# Patient Record
Sex: Female | Born: 1985 | Race: White | Hispanic: No | Marital: Married | Smoking: Never smoker
Health system: Southern US, Community
[De-identification: ages and names within clinical notes are randomized; demographics above are authoritative.]

## PROBLEM LIST (undated history)

## (undated) DIAGNOSIS — D649 Anemia, unspecified: Secondary | ICD-10-CM

## (undated) DIAGNOSIS — O039 Complete or unspecified spontaneous abortion without complication: Secondary | ICD-10-CM

## (undated) DIAGNOSIS — L709 Acne, unspecified: Secondary | ICD-10-CM

## (undated) DIAGNOSIS — F32A Depression, unspecified: Secondary | ICD-10-CM

## (undated) HISTORY — DX: Acne, unspecified: L70.9

## (undated) HISTORY — PX: TONSILLECTOMY: SUR1361

## (undated) HISTORY — DX: Anemia, unspecified: D64.9

## (undated) HISTORY — DX: Complete or unspecified spontaneous abortion without complication: O03.9

## (undated) HISTORY — DX: Depression, unspecified: F32.A

## (undated) HISTORY — PX: TONSILLECTOMY AND ADENOIDECTOMY: SUR1326

## (undated) HISTORY — PX: DILATION AND CURETTAGE OF UTERUS: SHX78

## (undated) HISTORY — PX: LASIK: SHX215

## (undated) HISTORY — PX: BREAST ENHANCEMENT SURGERY: SHX7

## (undated) HISTORY — PX: EYE SURGERY: SHX253

## (undated) HISTORY — PX: COSMETIC SURGERY: SHX468

---

## 2015-12-16 DIAGNOSIS — H1789 Other corneal scars and opacities: Secondary | ICD-10-CM | POA: Diagnosis not present

## 2015-12-16 DIAGNOSIS — H52223 Regular astigmatism, bilateral: Secondary | ICD-10-CM | POA: Diagnosis not present

## 2016-01-12 DIAGNOSIS — L700 Acne vulgaris: Secondary | ICD-10-CM | POA: Diagnosis not present

## 2016-01-28 DIAGNOSIS — Z01419 Encounter for gynecological examination (general) (routine) without abnormal findings: Secondary | ICD-10-CM | POA: Diagnosis not present

## 2016-01-28 DIAGNOSIS — Z124 Encounter for screening for malignant neoplasm of cervix: Secondary | ICD-10-CM | POA: Diagnosis not present

## 2016-01-28 DIAGNOSIS — Z30432 Encounter for removal of intrauterine contraceptive device: Secondary | ICD-10-CM | POA: Diagnosis not present

## 2016-01-29 DIAGNOSIS — Z01419 Encounter for gynecological examination (general) (routine) without abnormal findings: Secondary | ICD-10-CM | POA: Diagnosis not present

## 2016-01-29 DIAGNOSIS — Z124 Encounter for screening for malignant neoplasm of cervix: Secondary | ICD-10-CM | POA: Diagnosis not present

## 2016-01-29 LAB — HM PAP SMEAR: HM Pap smear: NEGATIVE

## 2016-01-30 LAB — HM PAP SMEAR: HM Pap smear: NEGATIVE

## 2016-08-25 DIAGNOSIS — S93601A Unspecified sprain of right foot, initial encounter: Secondary | ICD-10-CM | POA: Diagnosis not present

## 2016-08-25 DIAGNOSIS — S99921A Unspecified injury of right foot, initial encounter: Secondary | ICD-10-CM | POA: Diagnosis not present

## 2016-09-03 ENCOUNTER — Encounter: Payer: Self-pay | Admitting: Advanced Practice Midwife

## 2016-09-03 ENCOUNTER — Ambulatory Visit (INDEPENDENT_AMBULATORY_CARE_PROVIDER_SITE_OTHER): Payer: 59 | Admitting: Advanced Practice Midwife

## 2016-09-03 VITALS — BP 110/50 | HR 84 | Wt 141.0 lb

## 2016-09-03 DIAGNOSIS — Z3480 Encounter for supervision of other normal pregnancy, unspecified trimester: Secondary | ICD-10-CM | POA: Diagnosis not present

## 2016-09-03 DIAGNOSIS — Z113 Encounter for screening for infections with a predominantly sexual mode of transmission: Secondary | ICD-10-CM

## 2016-09-03 DIAGNOSIS — Z3491 Encounter for supervision of normal pregnancy, unspecified, first trimester: Secondary | ICD-10-CM

## 2016-09-03 NOTE — Patient Instructions (Signed)

## 2016-09-03 NOTE — Assessment & Plan Note (Deleted)
[facility] Prenatal Labs  Dating  Blood type:     Genetic Screen 1 Screen:    AFP:     Quad:     NIPS: Antibody:   Anatomic US  Rubella:   Varicella: I  GTT Early:               Third trimester:  RPR:     Rhogam  HBsAg:     TDaP vaccine                       Flu Shot: HIV:     Baby Food                                GBS:   Contraception  Pap:  CBB     CS/VBAC    Support Person

## 2016-09-03 NOTE — Progress Notes (Signed)
New Obstetric Patient H&P    Chief Complaint: "Desires prenatal care"   History of Present Illness: Patient is a 31 y.o. [id number] Not Hispanic or Latino female, LMP 07/21/2016 presents with amenorrhea and positive home pregnancy test. Based on her LMP, her EDD is Estimated Date of Delivery: 04/27/17 and her EGA is [redacted]w[redacted]d. Cycles are 4. days, regular, and occur approximately every : 28 days. Her last pap smear was 7 months ago and was no abnormalities.    She had a urine pregnancy test which was positive 2 week(s)  ago. Her last menstrual period was normal and lasted for  4 day(s). Since her LMP she claims she has experienced breast tenderness and fatigue. She denies vaginal bleeding. Her past medical history is notable for breast augmentation. Her prior pregnancies are notable for none  Since her LMP, she admits to the use of tobacco products  no She claims she has gained   no pounds since the start of her pregnancy.  There are cats in the home in the home  yes If yes Indoor husband does litter box She admits close contact with children on a regular basis  yes  She has had chicken pox in the past yes She has had Tuberculosis exposures, symptoms, or previously tested positive for TB   no Current or past history of domestic violence. no  Genetic Screening/Teratology Counseling: (Includes patient, baby's father, or anyone in either family with:)   1. Patient's age >/= 67 at [facility]  no 2. Thalassemia ([place], [place], Mediterranean, or Asian background): MCV<80  no 3. Neural tube defect (meningomyelocele, spina bifida, anencephaly)  no 4. Congenital heart defect  no  5. Down syndrome  no 6. Tay-Sachs (Jewish, [place]  no 7. Canavan's Disease  no 8. Sickle cell disease or trait (African)  no  9. Hemophilia or other blood disorders  no  10. Muscular dystrophy  no  11. Cystic fibrosis  no  12. Huntington's Chorea  no  13. Mental retardation/autism  no 14. Other inherited genetic  or chromosomal disorder  no 15. Maternal metabolic disorder (DM, PKU, etc)  no 16. Patient or FOB with a child with a birth defect not listed above no  16a. Patient or FOB with a birth defect themselves no 17. Recurrent pregnancy loss, or stillbirth  no  18. Any medications since LMP other than prenatal vitamins (include vitamins, supplements, OTC meds, drugs, alcohol)  no 19. Any other genetic/environmental exposure to discuss  no  Infection History:   1. Lives with someone with TB or TB exposed  no  2. Patient or partner has history of genital herpes  no 3. Rash or viral illness since LMP  no 4. History of STI (GC, CT, HPV, syphilis, HIV)  no 5. History of recent travel :  no  Other pertinent information:  no     Review of Systems:10 point review of systems negative unless otherwise noted in HPI  Past Medical History:  Past Medical History:  Diagnosis Date  . Acne     Past Surgical History:  Past Surgical History:  Procedure Laterality Date  . BREAST ENHANCEMENT SURGERY    . LASIK    . TONSILLECTOMY AND ADENOIDECTOMY      Gynecologic History: Patient's last menstrual period was 07/21/2016 (exact date).  Obstetric History: [id number]  Family History:  Family History  Problem Relation Age of Onset  . Diabetes Maternal Grandfather     Social History:  Social  History   Social History  . Marital status: Married    Spouse name: N/A  . Number of children: N/A  . Years of education: N/A   Occupational History  . Not on file.   Social History Main Topics  . Smoking status: Never Smoker  . Smokeless tobacco: Never Used  . Alcohol use Yes     Comment: LIGHT  . Drug use: No  . Sexual activity: Yes    Birth control/ protection: Pill   Other Topics Concern  . Not on file   Social History Narrative  . No narrative on file    Allergies:  No Known Allergies  Medications: Prior to Admission medications   Medication Sig Start Date End Date Taking?  Authorizing Provider  Prenatal Vit-Fe Fumarate-FA (MULTIVITAMIN-PRENATAL) 27-0.8 MG TABS tablet Take 1 tablet by mouth daily at 12 noon.   Yes [provider]  Calcium-Vitamin D-Vitamin K (VIACTIV) 500-500-40 MG-UNT-MCG CHEW Chew 1 tablet by mouth daily.    [provider]    Physical Exam Vitals: Blood pressure (!) 110/50, pulse 84, weight 141 lb (64 kg), last menstrual period 07/21/2016.  General: NAD HEENT: normocephalic, anicteric Thyroid: no enlargement, no palpable nodules Pulmonary: No increased work of breathing, CTAB Cardiovascular: RRR, distal pulses 2+ Abdomen: NABS, soft, non-tender, non-distended.  Umbilicus without lesions.  No hepatomegaly, splenomegaly or masses palpable. No evidence of hernia  Genitourinary:  External: Normal external female genitalia.  Normal urethral meatus, normal  Bartholin's and Skene's glands.    Vagina: Normal vaginal mucosa, no evidence of prolapse.    Cervix: Grossly normal in appearance, no bleeding, no CMT  Uterus: Enlarged, mobile, normal contour.    Adnexa: ovaries non-enlarged, no adnexal masses  Rectal: deferred Extremities: no edema, erythema, or tenderness Neurologic: Grossly intact Psychiatric: mood appropriate, affect full   Assessment: 31 y.o. G3P2002 at 5328w2d presenting to initiate prenatal care  Plan: 1) Avoid alcoholic beverages. 2) Patient encouraged not to smoke.  3) Discontinue the use of all non-medicinal drugs and chemicals.  4) Take prenatal vitamins daily.  5) Nutrition, food safety (fish, cheese advisories, and high nitrite foods) and exercise discussed. 6) Hospital and practice style discussed with cross coverage system.  7) Genetic Screening, such as with 1st Trimester Screening, cell free fetal DNA, AFP testing, and Ultrasound, as well as with amniocentesis and CVS as appropriate, is discussed with patient. At the conclusion of today's visit patient is undecided regarding genetic testing 8)  Patient is asked about travel to areas at risk for the Zika virus, and counseled to avoid travel and exposure to mosquitoes or sexual partners who may have themselves been exposed to the virus. Testing is discussed, and will be ordered as appropriate.   Tresea MallJane Jenifer Struve, CNM

## 2016-09-04 LAB — RPR+RH+ABO+RUB AB+AB SCR+CB...
Antibody Screen: NEGATIVE
HIV SCREEN 4TH GENERATION: NONREACTIVE
Hematocrit: 36.1 % (ref 34.0–46.6)
Hemoglobin: 12.2 g/dL (ref 11.1–15.9)
Hepatitis B Surface Ag: NEGATIVE
MCH: 31.4 pg (ref 26.6–33.0)
MCHC: 33.8 g/dL (ref 31.5–35.7)
MCV: 93 fL (ref 79–97)
PLATELETS: 271 10*3/uL (ref 150–379)
RBC: 3.89 x10E6/uL (ref 3.77–5.28)
RDW: 13 % (ref 12.3–15.4)
RPR Ser Ql: NONREACTIVE
RUBELLA: 6.67 {index} (ref >0.99)
Rh Factor: POSITIVE
WBC: 7.5 10*3/uL (ref 3.4–10.8)

## 2016-09-07 LAB — URINE CULTURE

## 2016-09-07 LAB — GC/CHLAMYDIA PROBE AMP
Chlamydia trachomatis, NAA: NEGATIVE
NEISSERIA GONORRHOEAE BY PCR: NEGATIVE

## 2016-09-14 ENCOUNTER — Ambulatory Visit (INDEPENDENT_AMBULATORY_CARE_PROVIDER_SITE_OTHER): Payer: 59

## 2016-09-14 ENCOUNTER — Encounter: Payer: 59 | Admitting: Advanced Practice Midwife

## 2016-09-14 DIAGNOSIS — Z3491 Encounter for supervision of normal pregnancy, unspecified, first trimester: Secondary | ICD-10-CM | POA: Diagnosis not present

## 2016-09-14 DIAGNOSIS — Z3687 Encounter for antenatal screening for uncertain dates: Secondary | ICD-10-CM | POA: Diagnosis not present

## 2016-09-23 ENCOUNTER — Telehealth: Payer: Self-pay | Admitting: Obstetrics and Gynecology

## 2016-09-23 ENCOUNTER — Encounter: Payer: 59 | Admitting: Advanced Practice Midwife

## 2016-09-23 ENCOUNTER — Other Ambulatory Visit: Payer: Self-pay | Admitting: Obstetrics and Gynecology

## 2016-09-23 DIAGNOSIS — Z3480 Encounter for supervision of other normal pregnancy, unspecified trimester: Secondary | ICD-10-CM

## 2016-09-23 DIAGNOSIS — Z3A09 9 weeks gestation of pregnancy: Secondary | ICD-10-CM

## 2016-09-23 NOTE — Telephone Encounter (Signed)
Conard NovakJackson, Stephen D, MD  You; Lenoria ChimeHogan, Julie M; Pereira, Shanda BumpsJessica 5 hours ago (4:28 AM)    This patient had an appt on 7/12 with JEG. Please schedule her for a routine prenatal and u/s for NT with associated lab work. Thank you!  (Routing comment)    I called and left voicemail for patient to call back to be schedule

## 2016-09-23 NOTE — Addendum Note (Signed)
Addended by: Thomasene MohairJACKSON, Zahki Hoogendoorn D on: 09/23/2016 04:25 AM   Modules accepted: Orders

## 2016-10-07 ENCOUNTER — Telehealth: Payer: Self-pay | Admitting: Obstetrics and Gynecology

## 2016-10-07 ENCOUNTER — Encounter: Payer: Self-pay | Admitting: Obstetrics and Gynecology

## 2016-10-07 NOTE — Telephone Encounter (Signed)
Patient with two days of spotting, obtained bedside ultrasound on L&D showing IUP, CRL 31100w0d no visible FHT, no flow on color flow.    "Society of Radiologyst in Ultrasound Guidelines for Transvaginal Ultrasonographic Diagnosis of Early Pregnancy Loss" and adopted in ACOG Practice Bulletin Number 150, May 2015 (reaffirmed 2017) "Early Pregnancy Loss"  - CRL of 7mm or greater and absence of fetal heartbeat  - Mean sac diameter of 25mm or greater and no embryo  - Absence of embryo with a discernable heartbeat 2 week after initial ultrasound showing a gestational sac without a yolk sac  - Absence of embryo with a discernable heartbeat 11 days after initial ultrasound showing a gestational sac with  yolk sac present  Condolences offered discussed management option briefly including expectant, medical, and surgical.  Will go home an process discuss with her husband.  Provided with my email.  Discussed follow up clinic ultrasound should she want to take another look for verification.

## 2016-10-08 ENCOUNTER — Other Ambulatory Visit: Payer: Self-pay | Admitting: Obstetrics and Gynecology

## 2016-10-08 MED ORDER — MISOPROSTOL 200 MCG PO TABS: ORAL_TABLET | ORAL | 0 refills | Status: DC

## 2016-10-08 MED ORDER — IBUPROFEN 600 MG PO TABS: 600.0000 mg | ORAL_TABLET | Freq: Four times a day (QID) | ORAL | 3 refills | Status: DC | PRN

## 2016-10-08 MED ORDER — OXYCODONE-ACETAMINOPHEN 5-325 MG PO TABS: 1.0000 | ORAL_TABLET | Freq: Four times a day (QID) | ORAL | 0 refills | Status: DC | PRN

## 2016-10-19 ENCOUNTER — Encounter: Payer: 59 | Admitting: Obstetrics and Gynecology

## 2016-10-19 ENCOUNTER — Other Ambulatory Visit: Payer: 59

## 2016-11-20 ENCOUNTER — Other Ambulatory Visit: Payer: Self-pay | Admitting: Obstetrics and Gynecology

## 2016-11-20 DIAGNOSIS — Z8759 Personal history of other complications of pregnancy, childbirth and the puerperium: Secondary | ICD-10-CM

## 2016-11-20 DIAGNOSIS — Z3490 Encounter for supervision of normal pregnancy, unspecified, unspecified trimester: Secondary | ICD-10-CM

## 2016-11-20 DIAGNOSIS — O3680X0 Pregnancy with inconclusive fetal viability, not applicable or unspecified: Secondary | ICD-10-CM

## 2016-11-20 DIAGNOSIS — Z3201 Encounter for pregnancy test, result positive: Secondary | ICD-10-CM

## 2016-11-22 ENCOUNTER — Telehealth: Payer: Self-pay | Admitting: Obstetrics and Gynecology

## 2016-11-22 ENCOUNTER — Other Ambulatory Visit: Payer: 59

## 2016-11-22 DIAGNOSIS — Z3201 Encounter for pregnancy test, result positive: Secondary | ICD-10-CM

## 2016-11-22 DIAGNOSIS — Z8759 Personal history of other complications of pregnancy, childbirth and the puerperium: Secondary | ICD-10-CM

## 2016-11-22 DIAGNOSIS — Z3490 Encounter for supervision of normal pregnancy, unspecified, unspecified trimester: Secondary | ICD-10-CM

## 2016-11-22 DIAGNOSIS — O3680X0 Pregnancy with inconclusive fetal viability, not applicable or unspecified: Secondary | ICD-10-CM

## 2016-11-22 NOTE — Telephone Encounter (Signed)
Pt is schedule Monday 11/22/16 and Wedneday 11/24/16

## 2016-11-22 NOTE — Telephone Encounter (Signed)
-----   Message from Vena AustriaAndreas Staebler, MD sent at 11/20/2016  9:55 PM EDT ----- Regarding: Lab appointment 9/10 and 9/12 Patient needs HCG levels drawn Monday 9/10 and Wednesday 9/12 orders are in patient aware

## 2016-11-23 ENCOUNTER — Encounter: Payer: Self-pay | Admitting: Obstetrics and Gynecology

## 2016-11-23 LAB — BETA HCG QUANT (REF LAB): HCG QUANT: 1707 m[IU]/mL

## 2016-11-24 ENCOUNTER — Other Ambulatory Visit: Payer: 59

## 2016-11-24 DIAGNOSIS — Z3201 Encounter for pregnancy test, result positive: Secondary | ICD-10-CM

## 2016-11-24 DIAGNOSIS — Z8759 Personal history of other complications of pregnancy, childbirth and the puerperium: Secondary | ICD-10-CM | POA: Diagnosis not present

## 2016-11-24 DIAGNOSIS — O3680X0 Pregnancy with inconclusive fetal viability, not applicable or unspecified: Secondary | ICD-10-CM

## 2016-11-24 DIAGNOSIS — Z3490 Encounter for supervision of normal pregnancy, unspecified, unspecified trimester: Secondary | ICD-10-CM

## 2016-11-25 LAB — BETA HCG QUANT (REF LAB): hCG Quant: 1627 m[IU]/mL

## 2016-12-01 ENCOUNTER — Other Ambulatory Visit: Payer: Self-pay | Admitting: Obstetrics and Gynecology

## 2016-12-01 ENCOUNTER — Telehealth: Payer: Self-pay | Admitting: Obstetrics and Gynecology

## 2016-12-01 ENCOUNTER — Other Ambulatory Visit: Payer: 59

## 2016-12-01 DIAGNOSIS — Z3201 Encounter for pregnancy test, result positive: Secondary | ICD-10-CM | POA: Diagnosis not present

## 2016-12-01 DIAGNOSIS — O3680X0 Pregnancy with inconclusive fetal viability, not applicable or unspecified: Secondary | ICD-10-CM

## 2016-12-01 DIAGNOSIS — Z3490 Encounter for supervision of normal pregnancy, unspecified, unspecified trimester: Secondary | ICD-10-CM | POA: Diagnosis not present

## 2016-12-01 NOTE — Telephone Encounter (Signed)
Pt aware and will come today. 

## 2016-12-01 NOTE — Telephone Encounter (Signed)
Last lab that was done was 9/12 for BHCG Quant. Please advise

## 2016-12-01 NOTE — Telephone Encounter (Signed)
Pt is calling to find out her her lab orders are in so she can schedule per Dr. Bonney Aid. Please advise.

## 2016-12-01 NOTE — Telephone Encounter (Signed)
Lab order is in

## 2016-12-02 ENCOUNTER — Other Ambulatory Visit: Payer: Self-pay | Admitting: Obstetrics and Gynecology

## 2016-12-02 DIAGNOSIS — O3680X0 Pregnancy with inconclusive fetal viability, not applicable or unspecified: Secondary | ICD-10-CM

## 2016-12-02 LAB — BETA HCG QUANT (REF LAB): hCG Quant: 1539 m[IU]/mL

## 2016-12-03 ENCOUNTER — Telehealth: Payer: Self-pay | Admitting: Obstetrics and Gynecology

## 2016-12-03 NOTE — Telephone Encounter (Signed)
-----   Message from Vena [place], MD sent at 12/02/2016  8:42 PM EDT ----- Regarding: Ultrasound Needs ultrasound, not Monday or Wednesday as patient working night so early Tuesday or Thursday would work with MD follow up after

## 2016-12-03 NOTE — Telephone Encounter (Signed)
Called and left voice mail for patient to call back to be schedule °

## 2016-12-03 NOTE — Telephone Encounter (Signed)
Due to reschedule ultrasound and follow up appt from Friday 11/26/16. There is no ultrasound and direst follow up with any provider. PT isn't able to make an follow up appt for the follow up appt. Given due to her children. Pt is requesting to schedule ultrasound on 12/07/16 at 12:00 with the possibility of Dr. Bonney Aid calling with Results. Please advise

## 2016-12-03 NOTE — Telephone Encounter (Signed)
Please advise 

## 2016-12-05 NOTE — Telephone Encounter (Signed)
That would work I can call her

## 2016-12-06 NOTE — Telephone Encounter (Signed)
Call pt and schedule

## 2016-12-06 NOTE — Telephone Encounter (Signed)
Patient aware that AMS will call with Result. Patient is schedule 12/07/16 at 12

## 2016-12-07 ENCOUNTER — Ambulatory Visit (INDEPENDENT_AMBULATORY_CARE_PROVIDER_SITE_OTHER): Payer: 59

## 2016-12-07 ENCOUNTER — Other Ambulatory Visit: Payer: Self-pay | Admitting: Obstetrics and Gynecology

## 2016-12-07 ENCOUNTER — Ambulatory Visit: Payer: 59 | Admitting: Certified Registered Nurse Anesthetist

## 2016-12-07 ENCOUNTER — Encounter
Admission: AD | Disposition: A | Discharge status: Home / Self Care | Payer: Self-pay | Source: Ambulatory Visit | Attending: Obstetrics and Gynecology

## 2016-12-07 ENCOUNTER — Encounter: Payer: Self-pay | Admitting: *Deleted

## 2016-12-07 ENCOUNTER — Ambulatory Visit
Admission: AD | Admit: 2016-12-07 | Discharge: 2016-12-07 | Disposition: A | Discharge status: Home / Self Care | Payer: 59 | Source: Ambulatory Visit | Attending: Obstetrics and Gynecology | Admitting: Obstetrics and Gynecology

## 2016-12-07 DIAGNOSIS — O3680X0 Pregnancy with inconclusive fetal viability, not applicable or unspecified: Secondary | ICD-10-CM

## 2016-12-07 DIAGNOSIS — Z3490 Encounter for supervision of normal pregnancy, unspecified, unspecified trimester: Secondary | ICD-10-CM | POA: Diagnosis not present

## 2016-12-07 DIAGNOSIS — Z363 Encounter for antenatal screening for malformations: Secondary | ICD-10-CM

## 2016-12-07 DIAGNOSIS — O021 Missed abortion: Secondary | ICD-10-CM

## 2016-12-07 HISTORY — PX: DILATION AND EVACUATION: SHX1459

## 2016-12-07 LAB — HEPATIC FUNCTION PANEL
ALK PHOS: 30 U/L — AB (ref 38–126)
ALT: 14 U/L (ref 14–54)
AST: 17 U/L (ref 15–41)
Albumin: 4.1 g/dL (ref 3.5–5.0)
Bilirubin, Direct: <0.1 mg/dL — ABNORMAL LOW (ref 0.1–0.5)
TOTAL PROTEIN: 6.6 g/dL (ref 6.5–8.1)
Total Bilirubin: 0.3 mg/dL (ref 0.3–1.2)

## 2016-12-07 LAB — CBC
HEMATOCRIT: 34.6 % — AB (ref 35.0–47.0)
HEMOGLOBIN: 12.2 g/dL (ref 12.0–16.0)
MCH: 32.5 pg (ref 26.0–34.0)
MCHC: 35.3 g/dL (ref 32.0–36.0)
MCV: 92 fL (ref 80.0–100.0)
Platelets: 187 10*3/uL (ref 150–440)
RBC: 3.76 MIL/uL — AB (ref 3.80–5.20)
RDW: 12.8 % (ref 11.5–14.5)
WBC: 7.8 10*3/uL (ref 3.6–11.0)

## 2016-12-07 LAB — ABO/RH: ABO/RH(D): B POS

## 2016-12-07 SURGERY — DILATION AND EVACUATION, UTERUS
Anesthesia: General

## 2016-12-07 MED ORDER — ROCURONIUM BROMIDE 50 MG/5ML IV SOLN
INTRAVENOUS | Status: AC
  Filled 2016-12-07: qty 1

## 2016-12-07 MED ORDER — OXYTOCIN 10 UNIT/ML IJ SOLN
INTRAMUSCULAR | Status: AC
  Filled 2016-12-07: qty 4

## 2016-12-07 MED ORDER — SUGAMMADEX SODIUM 200 MG/2ML IV SOLN
INTRAVENOUS | Status: AC
  Filled 2016-12-07: qty 2

## 2016-12-07 MED ORDER — ONDANSETRON 8 MG PO TBDP: 8.0000 mg | ORAL_TABLET | Freq: Three times a day (TID) | ORAL | 0 refills | Status: DC | PRN

## 2016-12-07 MED ORDER — PROPOFOL 10 MG/ML IV BOLUS
INTRAVENOUS | Status: DC | PRN
  Administered 2016-12-07: 150 mg via INTRAVENOUS

## 2016-12-07 MED ORDER — ONDANSETRON HCL 4 MG/2ML IJ SOLN
INTRAMUSCULAR | Status: DC | PRN
  Administered 2016-12-07: 4 mg via INTRAVENOUS

## 2016-12-07 MED ORDER — FENTANYL CITRATE (PF) 100 MCG/2ML IJ SOLN: 25.0000 ug | INTRAMUSCULAR | Status: DC | PRN

## 2016-12-07 MED ORDER — PHENYLEPHRINE HCL 10 MG/ML IJ SOLN
INTRAMUSCULAR | Status: DC | PRN
  Administered 2016-12-07 (×4): 100 ug via INTRAVENOUS

## 2016-12-07 MED ORDER — METHYLERGONOVINE MALEATE 0.2 MG/ML IJ SOLN
INTRAMUSCULAR | Status: AC
  Filled 2016-12-07: qty 1

## 2016-12-07 MED ORDER — SUCCINYLCHOLINE CHLORIDE 20 MG/ML IJ SOLN
INTRAMUSCULAR | Status: AC
  Filled 2016-12-07: qty 1

## 2016-12-07 MED ORDER — SILVER NITRATE-POT NITRATE 75-25 % EX MISC
CUTANEOUS | Status: AC
  Filled 2016-12-07: qty 6

## 2016-12-07 MED ORDER — MIDAZOLAM HCL 2 MG/2ML IJ SOLN
INTRAMUSCULAR | Status: AC
  Filled 2016-12-07: qty 2

## 2016-12-07 MED ORDER — ONDANSETRON HCL 4 MG/2ML IJ SOLN
INTRAMUSCULAR | Status: AC
  Filled 2016-12-07: qty 2

## 2016-12-07 MED ORDER — HYDROCODONE-ACETAMINOPHEN 5-325 MG PO TABS: 1.0000 | ORAL_TABLET | Freq: Four times a day (QID) | ORAL | 0 refills | Status: DC | PRN

## 2016-12-07 MED ORDER — METHYLERGONOVINE MALEATE 0.2 MG PO TABS: 0.2000 mg | ORAL_TABLET | Freq: Four times a day (QID) | ORAL | 0 refills | Status: AC

## 2016-12-07 MED ORDER — METHYLERGONOVINE MALEATE 0.2 MG/ML IJ SOLN
INTRAMUSCULAR | Status: DC | PRN
  Administered 2016-12-07: 0.2 mg via INTRAMUSCULAR

## 2016-12-07 MED ORDER — LACTATED RINGERS IV SOLN
INTRAVENOUS | Status: DC
  Administered 2016-12-07 (×2): via INTRAVENOUS

## 2016-12-07 MED ORDER — PROPOFOL 10 MG/ML IV BOLUS
INTRAVENOUS | Status: AC
  Filled 2016-12-07: qty 20

## 2016-12-07 MED ORDER — SODIUM CHLORIDE 0.9 % IV SOLN
INTRAVENOUS | Status: DC | PRN
  Administered 2016-12-07: 22:00:00 via INTRAVENOUS

## 2016-12-07 MED ORDER — LIDOCAINE HCL (CARDIAC) 20 MG/ML IV SOLN
INTRAVENOUS | Status: DC | PRN
  Administered 2016-12-07: 80 mg via INTRAVENOUS

## 2016-12-07 MED ORDER — OXYTOCIN 10 UNIT/ML IJ SOLN
INTRAVENOUS | Status: DC | PRN
  Administered 2016-12-07: 40 [IU] via INTRAVENOUS

## 2016-12-07 MED ORDER — FENTANYL CITRATE (PF) 100 MCG/2ML IJ SOLN
INTRAMUSCULAR | Status: AC
  Filled 2016-12-07: qty 2

## 2016-12-07 MED ORDER — DEXAMETHASONE SODIUM PHOSPHATE 10 MG/ML IJ SOLN
INTRAMUSCULAR | Status: AC
  Filled 2016-12-07: qty 1

## 2016-12-07 MED ORDER — DOXYCYCLINE HYCLATE 100 MG IV SOLR
200.0000 mg | INTRAVENOUS | Status: AC
  Administered 2016-12-07: 200 mg via INTRAVENOUS
  Filled 2016-12-07: qty 200

## 2016-12-07 MED ORDER — DEXAMETHASONE SODIUM PHOSPHATE 10 MG/ML IJ SOLN
INTRAMUSCULAR | Status: DC | PRN
  Administered 2016-12-07: 5 mg via INTRAVENOUS

## 2016-12-07 MED ORDER — MIDAZOLAM HCL 2 MG/2ML IJ SOLN
INTRAMUSCULAR | Status: DC | PRN
  Administered 2016-12-07: 2 mg via INTRAVENOUS

## 2016-12-07 MED ORDER — SUCCINYLCHOLINE CHLORIDE 20 MG/ML IJ SOLN
INTRAMUSCULAR | Status: DC | PRN
  Administered 2016-12-07: 100 mg via INTRAVENOUS

## 2016-12-07 MED ORDER — LIDOCAINE HCL (PF) 2 % IJ SOLN
INTRAMUSCULAR | Status: AC
  Filled 2016-12-07: qty 2

## 2016-12-07 MED ORDER — GLYCOPYRROLATE 0.2 MG/ML IJ SOLN
INTRAMUSCULAR | Status: DC | PRN
  Administered 2016-12-07: 0.2 mg via INTRAVENOUS

## 2016-12-07 MED ORDER — ROCURONIUM BROMIDE 100 MG/10ML IV SOLN
INTRAVENOUS | Status: DC | PRN
  Administered 2016-12-07: 30 mg via INTRAVENOUS
  Administered 2016-12-07 (×2): 10 mg via INTRAVENOUS

## 2016-12-07 MED ORDER — FENTANYL CITRATE (PF) 100 MCG/2ML IJ SOLN
INTRAMUSCULAR | Status: DC | PRN
  Administered 2016-12-07 (×2): 50 ug via INTRAVENOUS

## 2016-12-07 MED ORDER — SUGAMMADEX SODIUM 200 MG/2ML IV SOLN
INTRAVENOUS | Status: DC | PRN
  Administered 2016-12-07: 140 mg via INTRAVENOUS

## 2016-12-07 MED ORDER — ONDANSETRON HCL 4 MG/2ML IJ SOLN: 4.0000 mg | Freq: Once | INTRAMUSCULAR | Status: DC | PRN

## 2016-12-07 SURGICAL SUPPLY — 22 items
BAG URO DRAIN 2000ML W/SPOUT (MISCELLANEOUS) IMPLANT
CATH FOL 2WAY LX 16X30 (CATHETERS) ×2 IMPLANT
CATH FOLEY 2WAY  5CC 16FR (CATHETERS)
CATH ROBINSON RED A/P 16FR (CATHETERS) ×2 IMPLANT
CATH URTH 16FR FL 2W BLN LF (CATHETERS) IMPLANT
FILTER UTR ASPR SPEC (MISCELLANEOUS) ×1 IMPLANT
FLTR UTR ASPR SPEC (MISCELLANEOUS) ×2
GLOVE BIO SURGEON STRL SZ7 (GLOVE) ×6 IMPLANT
GOWN STRL REUS W/ TWL LRG LVL3 (GOWN DISPOSABLE) ×2 IMPLANT
GOWN STRL REUS W/TWL LRG LVL3 (GOWN DISPOSABLE) ×2
KIT BERKELEY 1ST TRIMESTER 3/8 (MISCELLANEOUS) ×2 IMPLANT
KIT RM TURNOVER CYSTO AR (KITS) ×2 IMPLANT
NS IRRIG 500ML POUR BTL (IV SOLUTION) ×2 IMPLANT
PACK DNC HYST (MISCELLANEOUS) ×2 IMPLANT
PAD OB MATERNITY 4.3X12.25 (PERSONAL CARE ITEMS) ×2 IMPLANT
PAD PREP 24X41 OB/GYN DISP (PERSONAL CARE ITEMS) ×2 IMPLANT
SET BERKELEY SUCTION TUBING (SUCTIONS) ×2 IMPLANT
SYR 30ML LL (SYRINGE) ×2 IMPLANT
TOWEL OR 17X26 4PK STRL BLUE (TOWEL DISPOSABLE) IMPLANT
VACURETTE 10 RIGID CVD (CANNULA) IMPLANT
VACURETTE 12 RIGID CVD (CANNULA) IMPLANT
VACURETTE 8 RIGID CVD (CANNULA) ×2 IMPLANT

## 2016-12-07 NOTE — Discharge Instructions (Signed)

## 2016-12-07 NOTE — Anesthesia Procedure Notes (Signed)
Procedure Name: Intubation Date/Time: 12/07/2016 9:38 PM Performed by: Nelda Marseille Pre-anesthesia Checklist: Patient identified, Patient being monitored, Timeout performed, Emergency Drugs available and Suction available Patient Re-evaluated:Patient Re-evaluated prior to induction Oxygen Delivery Method: Circle system utilized Preoxygenation: Pre-oxygenation with 100% oxygen Induction Type: IV induction Ventilation: Mask ventilation without difficulty Laryngoscope Size: Mac and 3 Grade View: Grade I Tube type: Oral Tube size: 7.0 mm Number of attempts: 1 Airway Equipment and Method: Stylet Placement Confirmation: ETT inserted through vocal cords under direct vision,  positive ETCO2 and breath sounds checked- equal and bilateral Secured at: 21 cm Tube secured with: Tape Dental Injury: Teeth and Oropharynx as per pre-operative assessment

## 2016-12-07 NOTE — Op Note (Signed)
  Operative Note    Pre-Op Diagnosis: missed abortion, suspected molar pregnancy  Post-Op Diagnosis: missed abortion, suspected molar pregnancy  Procedures:  Dilation and suction curettage  Primary Surgeon: [name], MD   EBL: 750 mL   IVF: 2,700 mL crystalloid  Urine output: 50 mL clear urine by in-and-out catheterization at beginning of case  Specimens: products of conception  Drains: none  Complications: None   Disposition: PACU   Condition: Stable   Findings: normal appearing cervix, uterus measured about 8 weeks at beginning of procedure  Procedure Summary:   After informed consent was confirmed, the patient was taken to the operating room where general anesthesia was induced.  Her legs were carefully placed in the [place] stirrups.  She was prepped and draped in the standard fashion with care to protect at-risk nerves and blood supply based on positioning.  A speculum was placed in the vagina and the cervix was prepped with betadine.   A tenaculum was placed on the anterior lip of the cervix.  The cervix was serially dilated to 9 mm using Hegar dilators.  The 8 mm suction curette was advanced to the uterine fundus.  Three passes were made with the suction curette with evacuation of adequate tissue noted.  Two passes were made using sharp curettage until a gritty texture was noted.  Another pass with suction curette performed.   Patient had brisk bleeding at this point. She was given pitocin in her IV fluids. An intramuscular dose of methergine 0.2 mg given during procedure.  A foley catheter was threaded through the cervix and the balloon was inflated to 40 mL.  This remained in place for about 20 minutes with a single flush of the catheter of 10 mL at the 10 minute mark.  In 3 minute increments the catheter balloon was decreased in volume by 10 mL.  After a total of about 30 minutes the Foley catheter was completely removed.  Bleeding was normal at this point for this  type of procedure. She was watched an additional 10 minutes at this point with still minimal uterine bleeding note.  All instruments were removed from the vagina and the cervix was noted to be hemostatic after removal of the tenaculum.    The patient tolerated the procedure well.  Sponge, lap, needle, and instrument counts were correct x 2.  VTE prophylaxis: SCDs. Antibiotic prophylaxis: doxycycline 200 mg IV within 30 minutes of start of case. She was awakened in the operating room and was taken to the PACU in stable condition.   [name], MD 12/07/2016 10:40 PM

## 2016-12-07 NOTE — Transfer of Care (Signed)
Immediate Anesthesia Transfer of Care Note  Patient: Brittany Good  Procedure(s) Performed: Procedure(s): DILATATION AND EVACUATION (N/A)  Patient Location: PACU  Anesthesia Type:General  Level of Consciousness: awake and sedated  Airway & Oxygen Therapy: Patient Spontanous Breathing and Patient connected to face mask oxygen  Post-op Assessment: Report given to RN and Post -op Vital signs reviewed and stable  Post vital signs: Reviewed and stable  Last Vitals:  Vitals:   12/07/16 1734  BP: 119/64  Pulse: 82  Resp: 16  Temp: 36.6 C  SpO2: 100%    Last Pain:  Vitals:   12/07/16 1734  TempSrc: Temporal  PainSc: 0-No pain         Complications: No apparent anesthesia complications

## 2016-12-07 NOTE — Interval H&P Note (Signed)
History and Physical Interval Note:  12/07/2016 6:50 PM  Brittany Good  has presented today for surgery, with the diagnosis of MOLAR PREGNANCY  The various methods of treatment have been discussed with the patient and family. After consideration of risks, benefits and other options for treatment, the patient has consented to  Procedure(s): DILATATION AND EVACUATION (N/A) as a surgical intervention .  The patient's history has been reviewed, patient examined, no change in status, stable for surgery.  I have reviewed the patient's chart and labs.  Questions were answered to the patient's satisfaction.    I have carefully reviewed the patient's medical history and clinical course that has led to this necessary surgery.  I have reviewed her history of spontaneous abortion in July and uncertain resolution of that pregnancy loss. I have reviewed her clinical symptoms, her laboratory markers of pregnancy, which have been slowly declining. I have reviewed the fact that she had an ultrasound after her medical treatment of her spontaneous abortion that demonstrated that she had a relatively empty uterus with a thin uterine lining. She had a positive pregnancy test again in August.  The quantitative hCG levels have been dropping slowly over the last couple of weeks.  Today she had an ultrasound showing a collection of tissue in her uterus (approximately 4x3cm with some vascularity).  She had no bleeding since three weeks after treatment for her prior SAB and notes that she has started having bleeding today, though mild.  Differential diagnosis includes gestational trophoblastic neoplasia (most likely a molar pregnancy) and retained products of conception.  I have reviewed the procedure with Babs, including all its attendant risks and benefits, including; bleeding, infection, damage to nearby organs by uterine perforation, possibility of need for blood transfusion, and ultimately, if bleeding cannot be  controlled, the possibility for the need for a hysterectomy.  The patient has voiced that she wishes to proceed understanding these risks and would consent to a hysterectomy in an emergency situation.  Appropriate perioperative antibiotics have been prescribed and for VTE prophylaxis the patient will be wearing SCDs throughout the case.  She has been NPO for > 8 hours and we will proceed to the OR when ready.    [name], MD 12/07/2016 6:57 PM

## 2016-12-07 NOTE — Anesthesia Post-op Follow-up Note (Signed)
Anesthesia QCDR form completed.        

## 2016-12-07 NOTE — H&P (Signed)
Obstetrics & Gynecology Surgery H&P    Chief Complaint: Scheduled Surgery   History of Present Illness: Patient is a 31 y.o. [id number] presenting for scheduled suction D&C, for the treatment or further evaluation of retained products vs molar pregnancy. The patient has been followed for what was initially believed to be an early pregnancy based on positive home UPT.  No LMP as she never resumed menstruation following her medical managed missed abortion on 10/07/2016.  HCG was trended and has been down trending slightly.  Given the slow drop ultrasound was obtained to further characterize the endometrium and rule out potential early ectopic pregnancy.  This revealed findings concerning for possible molar pregnancy.  She denies and cramping or vaginal bleeding.  Rh status positive Rhogam is not indicated.    Prior Treatments prior to proceeding with surgery include: HCG trend and preoperative ultrasound.  HCG trend 1707 on 11/22/16, 1627 on 11/24/16, most recently 1539 on 12/01/16  Preoperative Ultrasound: Ultrasound 4.81 x 3.02 cm endometrial mass at the uterine fundus, exhibiting cystic changes, significant vascularity surrounding the mass but less prominent flow within the mass   Review of Systems:10 point review of systems  Past Medical History:  Past Medical History:  Diagnosis Date  . Acne     Past Surgical History:  Past Surgical History:  Procedure Laterality Date  . BREAST ENHANCEMENT SURGERY    . LASIK    . TONSILLECTOMY AND ADENOIDECTOMY      Family History:  Family History  Problem Relation Age of Onset  . Diabetes Maternal Grandfather     Social History:  Social History   Social History  . Marital status: Married    Spouse name: N/A  . Number of children: N/A  . Years of education: N/A   Occupational History  . Not on file.   Social History Main Topics  . Smoking status: Never Smoker  . Smokeless tobacco: Never Used  . Alcohol use Yes     Comment: LIGHT  .  Drug use: No  . Sexual activity: Yes    Birth control/ protection: Pill   Other Topics Concern  . Not on file   Social History Narrative  . No narrative on file    Allergies:  No Known Allergies  Medications: Prior to Admission medications   Medication Sig Start Date End Date Taking? Authorizing Provider  Calcium-Vitamin D-Vitamin K (VIACTIV) 500-500-40 MG-UNT-MCG CHEW Chew 1 tablet by mouth daily.    [provider]  ibuprofen (ADVIL,MOTRIN) 600 MG tablet Take 1 tablet (600 mg total) by mouth every 6 (six) hours as needed. 10/08/16   Vena [place], MD  misoprostol (CYTOTEC) 200 MCG tablet Place four tablets in between your gums and cheeks (two tablets on each side) as instructed OR insert four tablets vaginally, repeat every 6-hrs as needed 10/08/16   Vena [place], MD  oxyCODONE-acetaminophen (PERCOCET/ROXICET) 5-325 MG tablet Take 1-2 tablets by mouth every 6 (six) hours as needed. 10/08/16   Vena [place], MD  Prenatal Vit-Fe Fumarate-FA (MULTIVITAMIN-PRENATAL) 27-0.8 MG TABS tablet Take 1 tablet by mouth daily at 12 noon.    [provider]    Physical Exam Vitals: Last menstrual period 07/21/2016. General: NAD HEENT: normocephalic, anicteric Pulmonary: No increased work of breathing Cardiovascular: RRR, distal pulses 2+ Abdomen: soft, non-tender, non-distended Genitourinary: deferred Extremities: no edema, erythema, or tenderness Neurologic: Grossly intact Psychiatric: mood appropriate, affect full  Imaging No results found.  Assessment: 31 y.o. A5W0981 presenting for scheduled suction D&C  Plan:  1) I have discussed with the patient the indications for the procedure. Included in the discussion were the options of therapy, as wall as their individual risks, benefits, and complications. Ample time was given to answer all questions.    After consideration of her history and findings, mutual decision has been made to proceed with  D+C/hysteroscopy. While the incidence is low, the risks from this surgery include, but are not limited to, the risks of anesthesia, hemorrhage, infection, perforation, and injury to adjacent structures including bowel, bladder and blood vessels.   2) Routine postoperative instructions were reviewed with the patient and her family in detail today including the expected length of recovery and likely postoperative course.  The patient concurred with the proposed plan, giving informed written consent for the surgery today.  Patient instructed on the importance of being NPO after midnight prior to her procedure.  If warranted preoperative prophylactic antibiotics and SCDs ordered on call to the OR to meet SCIP guidelines and adhere to recommendation laid forth in ACOG Practice Bulletin Number 104 May 2009  "Antibiotic Prophylaxis for Gynecologic Procedures".

## 2016-12-07 NOTE — Anesthesia Preprocedure Evaluation (Signed)
Anesthesia Evaluation  Patient identified by MRN, date of birth, ID band Patient awake    Reviewed: Allergy & Precautions, NPO status , Patient's Chart, lab work & pertinent test results  Airway Mallampati: II  TM Distance: >3 FB     Dental  (+) Teeth Intact   Pulmonary neg pulmonary ROS,    Pulmonary exam normal        Cardiovascular negative cardio ROS Normal cardiovascular exam     Neuro/Psych negative neurological ROS  negative psych ROS   GI/Hepatic negative GI ROS, Neg liver ROS,   Endo/Other  negative endocrine ROS  Renal/GU negative Renal ROS  Female GU complaint     Musculoskeletal negative musculoskeletal ROS (+)   Abdominal Normal abdominal exam  (+)   Peds negative pediatric ROS (+)  Hematology negative hematology ROS (+)   Anesthesia Other Findings   Reproductive/Obstetrics                             Anesthesia Physical Anesthesia Plan  ASA: I and emergent  Anesthesia Plan: General   Post-op Pain Management:    Induction: Intravenous, Rapid sequence and Cricoid pressure planned  PONV Risk Score and Plan: 4 or greater and Ondansetron, Dexamethasone, Midazolam, Scopolamine patch - Pre-op and Propofol infusion  Airway Management Planned: Oral ETT  Additional Equipment:   Intra-op Plan:   Post-operative Plan: Extubation in OR  Informed Consent: I have reviewed the patients History and Physical, chart, labs and discussed the procedure including the risks, benefits and alternatives for the proposed anesthesia with the patient or authorized representative who has indicated his/her understanding and acceptance.   Dental advisory given  Plan Discussed with: CRNA and Surgeon  Anesthesia Plan Comments:         Anesthesia Quick Evaluation

## 2016-12-08 ENCOUNTER — Encounter: Payer: Self-pay | Admitting: Obstetrics and Gynecology

## 2016-12-08 LAB — TYPE AND SCREEN
ABO/RH(D): B POS
Antibody Screen: NEGATIVE
UNIT DIVISION: 0
UNIT DIVISION: 0
Unit division: 0
Unit division: 0

## 2016-12-08 LAB — BPAM RBC
BLOOD PRODUCT EXPIRATION DATE: 201810192359
BLOOD PRODUCT EXPIRATION DATE: 201810192359
Blood Product Expiration Date: 201810192359
Blood Product Expiration Date: 201810192359
UNIT TYPE AND RH: 5100
UNIT TYPE AND RH: 5100
Unit Type and Rh: 5100
Unit Type and Rh: 5100

## 2016-12-08 LAB — PREPARE RBC (CROSSMATCH)

## 2016-12-09 NOTE — Anesthesia Postprocedure Evaluation (Signed)
Anesthesia Post Note  Patient: Brittany Good  Procedure(s) Performed: Procedure(s) (LRB): DILATATION AND EVACUATION (N/A)  Patient location during evaluation: PACU Anesthesia Type: General Level of consciousness: awake and alert and oriented Pain management: pain level controlled Vital Signs Assessment: post-procedure vital signs reviewed and stable Respiratory status: spontaneous breathing Cardiovascular status: blood pressure returned to baseline Anesthetic complications: no     Last Vitals:  Vitals:   12/07/16 2335 12/07/16 2345  BP:  107/71  Pulse: 97 (!) 120  Resp: (!) 27   Temp:    SpO2: 99% 100%    Last Pain:  Vitals:   12/07/16 1734  TempSrc: Temporal                 Brittany Good

## 2016-12-10 ENCOUNTER — Encounter: Payer: Self-pay | Admitting: Obstetrics and Gynecology

## 2016-12-10 LAB — SURGICAL PATHOLOGY

## 2016-12-11 ENCOUNTER — Encounter: Payer: Self-pay | Admitting: Obstetrics and Gynecology

## 2016-12-20 ENCOUNTER — Ambulatory Visit (INDEPENDENT_AMBULATORY_CARE_PROVIDER_SITE_OTHER): Payer: 59 | Admitting: Obstetrics and Gynecology

## 2016-12-20 ENCOUNTER — Encounter: Payer: Self-pay | Admitting: Obstetrics and Gynecology

## 2016-12-20 VITALS — BP 110/68 | Ht 68.0 in | Wt 150.0 lb

## 2016-12-20 DIAGNOSIS — O021 Missed abortion: Secondary | ICD-10-CM

## 2016-12-20 NOTE — Progress Notes (Signed)
   Postoperative Follow-up Patient presents post op from suction D&C 2 weeks ago for missed abortion.  Subjective: Patient reports marked improvement in her preop symptoms. Eating a regular diet without difficulty. The patient is not having any pain.  Activity: normal activities of daily living.  She has had some mild vaginal bleeding since her surgery. She specifically denies fever, chills, nausea, emesis, abdominal pain.   Objective: Vitals:   12/20/16 1624  BP: 110/68   Vital Signs: BP 110/68   Ht  (1.727 m)   Wt 150 lb (68 kg)   LMP 07/21/2016 (Exact Date)   BMI 22.81 kg/m  Constitutional: Well nourished, well developed female in no acute distress.  HEENT: normal Skin: Warm and dry.  Extremity: no edema  Abdomen: Soft, non-tender, normal bowel sounds; no bruits, organomegaly or masses.   Assessment: 31 y.o. s/p dilation and curettage progressing well  Plan: Patient has done well after surgery with no apparent complications.  I have discussed the post-operative course to date, and the expected progress moving forward.  The patient understands what complications to be concerned about.  I will see the patient in routine follow up, or sooner if needed.    Activity plan: No restriction.  Discussed future pregnancy.  Recommend she take a pregnancy test and if negative, she can resume contraception.  Recommend 1-2 cycles prior to attempting conception again.  Discussed risks of Asherman Syndrome with D&C.    F/u PRN  [name] 12/20/2016, 4:40 PM

## 2016-12-24 DIAGNOSIS — H5213 Myopia, bilateral: Secondary | ICD-10-CM | POA: Diagnosis not present

## 2016-12-24 DIAGNOSIS — H1789 Other corneal scars and opacities: Secondary | ICD-10-CM | POA: Diagnosis not present

## 2016-12-24 DIAGNOSIS — H52223 Regular astigmatism, bilateral: Secondary | ICD-10-CM | POA: Diagnosis not present

## 2017-02-01 ENCOUNTER — Encounter: Payer: Self-pay | Admitting: Advanced Practice Midwife

## 2017-03-04 ENCOUNTER — Telehealth: Payer: Self-pay

## 2017-03-04 NOTE — Telephone Encounter (Signed)
Pt requesting to speak to CLG regarding spotting she is having. [id number]#[phone]#[phone]

## 2017-04-20 ENCOUNTER — Other Ambulatory Visit: Payer: Self-pay | Admitting: Obstetrics and Gynecology

## 2017-04-20 DIAGNOSIS — J111 Influenza due to unidentified influenza virus with other respiratory manifestations: Secondary | ICD-10-CM

## 2017-04-20 MED ORDER — OSELTAMIVIR PHOSPHATE 75 MG PO CAPS: 75.0000 mg | ORAL_CAPSULE | Freq: Two times a day (BID) | ORAL | 0 refills | Status: AC

## 2017-06-21 ENCOUNTER — Encounter (INDEPENDENT_AMBULATORY_CARE_PROVIDER_SITE_OTHER): Payer: Self-pay

## 2017-06-29 ENCOUNTER — Encounter: Payer: Self-pay | Admitting: Internal Medicine

## 2017-06-29 ENCOUNTER — Ambulatory Visit (INDEPENDENT_AMBULATORY_CARE_PROVIDER_SITE_OTHER): Payer: 59 | Admitting: Internal Medicine

## 2017-06-29 VITALS — BP 104/68 | HR 62 | Temp 98.2°F | Ht 68.0 in | Wt 144.8 lb

## 2017-06-29 DIAGNOSIS — Z13220 Encounter for screening for lipoid disorders: Secondary | ICD-10-CM

## 2017-06-29 DIAGNOSIS — D649 Anemia, unspecified: Secondary | ICD-10-CM | POA: Diagnosis not present

## 2017-06-29 DIAGNOSIS — L709 Acne, unspecified: Secondary | ICD-10-CM | POA: Insufficient documentation

## 2017-06-29 DIAGNOSIS — Z0000 Encounter for general adult medical examination without abnormal findings: Secondary | ICD-10-CM

## 2017-06-29 DIAGNOSIS — E559 Vitamin D deficiency, unspecified: Secondary | ICD-10-CM | POA: Diagnosis not present

## 2017-06-29 DIAGNOSIS — Z1329 Encounter for screening for other suspected endocrine disorder: Secondary | ICD-10-CM | POA: Diagnosis not present

## 2017-06-29 DIAGNOSIS — Z0184 Encounter for antibody response examination: Secondary | ICD-10-CM | POA: Diagnosis not present

## 2017-06-29 DIAGNOSIS — Z1389 Encounter for screening for other disorder: Secondary | ICD-10-CM

## 2017-06-29 DIAGNOSIS — Z1159 Encounter for screening for other viral diseases: Secondary | ICD-10-CM | POA: Diagnosis not present

## 2017-06-29 NOTE — Patient Instructions (Addendum)
Please check on Tdap vaccine if you can find when you had it  Take care    Acne Acne is a skin problem that causes pimples. Acne occurs when the pores in the skin get blocked. The pores may become infected with bacteria, or they may become red, sore, and swollen. Acne is a common skin problem, especially for teenagers. Acne usually goes away over time. What are the causes? Each pore contains an oil gland. Oil glands make an oily substance that is called sebum. Acne happens when these glands get plugged with sebum, dead skin cells, and dirt. Then, the bacteria that are normally found in the oil glands multiply and cause inflammation. Acne is commonly triggered by changes in your hormones. These hormonal changes can cause the oil glands to get bigger and to make more sebum. Factors that can make acne worse include:  Hormone changes during: ? Adolescence. ? Women's menstrual cycles. ? Pregnancy.  Oil-based cosmetics and hair products.  Harshly scrubbing the skin.  Strong soaps.  Stress.  Hormone problems that are due to certain diseases.  Long or oily hair rubbing against the skin.  Certain medicines.  Pressure from headbands, backpacks, or shoulder pads.  Exposure to certain oils and chemicals.  What increases the risk? This condition is more likely to develop in:  Teenagers.  People who have a family history of acne.  What are the signs or symptoms? Acne often occurs on the face, neck, chest, and upper back. Symptoms include:  Small, red bumps (pimples or papules).  Whiteheads.  Blackheads.  Small, pus-filled pimples (pustules).  Big, red pimples or pustules that feel tender.  More severe acne can cause:  An infected area that contains a collection of pus (abscess).  Hard, painful, fluid-filled sacs (cysts).  Scars.  How is this diagnosed? This condition is diagnosed with a medical history and physical exam. Blood tests may also be done. How is this  treated? Treatment for this condition can vary depending on the severity of your acne. Treatment may include:  Creams and lotions that prevent oil glands from clogging.  Creams and lotions that treat or prevent infections and inflammation.  Antibiotic medicines that are applied to the skin or taken as a pill.  Pills that decrease sebum production.  Birth control pills.  Light or laser treatments.  Surgery.  Injections of medicine into the affected areas.  Chemicals that cause peeling of the skin.  Your health care provider will also recommend the best way to take care of your skin. Good skin care is the most important part of treatment. Follow these instructions at home: Skin care Take care of your skin as told by your health care provider. You may be told to do these things:  Wash your skin gently at least two times each day, as well as: ? After you exercise. ? Before you go to bed.  Use mild soap.  Apply a water-based skin moisturizer after you wash your skin.  Use a sunscreen or sunblock with SPF 30 or greater. This is especially important if you are using acne medicines.  Choose cosmetics that will not plug your oil glands (are noncomedogenic).  Medicines  Take over-the-counter and prescription medicines only as told by your health care provider.  If you were prescribed an antibiotic medicine, apply or take it as told by your health care provider. Do not stop taking the antibiotic even if your condition improves. General instructions  Keep your hair clean and off of  your face. If you have oily hair, shampoo your hair regularly or daily.  Avoid leaning your chin or forehead against your hands.  Avoid wearing tight headbands or hats.  Avoid picking or squeezing your pimples. That can make your acne worse and cause scarring.  Keep all follow-up visits as told by your health care provider. This is important.  Shave gently and only when necessary.  Keep a food  journal to figure out if any foods are linked with your acne. Contact a health care provider if:  Your acne is not better after eight weeks.  Your acne gets worse.  You have a large area of skin that is red or tender.  You think that you are having side effects from any acne medicine. This information is not intended to replace advice given to you by your health care provider. Make sure you discuss any questions you have with your health care provider. Document Released: 02/27/2000 Document Revised: 10/31/2015 Document Reviewed: 05/08/2014 Elsevier Interactive Patient Education  [employer].

## 2017-06-29 NOTE — Progress Notes (Signed)
Pre visit review using our clinic review tool, if applicable. No additional management support is needed unless otherwise documented below in the visit note. 

## 2017-06-29 NOTE — Progress Notes (Signed)
Chief Complaint  Patient presents with  . New Patient (Initial Visit)  . Annual Exam   New patient moved from [facility] 2 years ago  Annual exam no complaints   Review of Systems  Constitutional: Negative for weight loss.  HENT: Negative for hearing loss.   Eyes: Negative for blurred vision.  Respiratory: Negative for shortness of breath.   Cardiovascular: Negative for chest pain.  Gastrointestinal: Negative for abdominal pain and blood in stool.  Genitourinary: Negative for dysuria.  Musculoskeletal: Negative for falls.  Skin: Negative for rash.       +acne   Neurological: Negative for headaches.  Psychiatric/Behavioral: Negative for depression.   Past Medical History:  Diagnosis Date  . Acne   . Miscarriage    Past Surgical History:  Procedure Laterality Date  . BREAST ENHANCEMENT SURGERY    . BREAST ENHANCEMENT SURGERY     2014  . DILATION AND CURETTAGE OF UTERUS     2018  . DILATION AND EVACUATION N/A 12/07/2016   Procedure: DILATATION AND EVACUATION;  Surgeon: Conard Novak, MD;  Location: [facility] ORS;  Service: Gynecology;  Laterality: N/A;  . LASIK    . LASIK     2009  . TONSILLECTOMY     1992  . TONSILLECTOMY AND ADENOIDECTOMY     Family History  Problem Relation Age of Onset  . Diabetes Maternal Grandfather   . Cancer Maternal Grandfather   . Cancer Maternal Grandmother        ? type  . Cancer Paternal Grandmother        lung smoker   . Depression Paternal Grandfather    Social History   Socioeconomic History  . Marital status: Married    Spouse name: Not on file  . Number of children: Not on file  . Years of education: Not on file  . Highest education level: Not on file  Occupational History  . Not on file  Social Needs  . Financial resource strain: Not on file  . Food insecurity:    Worry: Not on file    Inability: Not on file  . Transportation needs:    Medical: Not on file    Non-medical: Not on file  Tobacco Use  . Smoking status: Never  Smoker  . Smokeless tobacco: Never Used  Substance and Sexual Activity  . Alcohol use: Yes    Comment: LIGHT  . Drug use: No  . Sexual activity: Yes    Birth control/protection: Pill  Lifestyle  . Physical activity:    Days per week: Not on file    Minutes per session: Not on file  . Stress: Not on file  Relationships  . Social connections:    Talks on phone: Not on file    Gets together: Not on file    Attends religious service: Not on file    Active member of club or organization: Not on file    Attends meetings of clubs or organizations: Not on file    Relationship status: Not on file  . Intimate partner violence:    Fear of current or ex partner: Not on file    Emotionally abused: Not on file    Physically abused: Not on file    Forced sexual activity: Not on file  Other Topics Concern  . 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 B Complex-C (SUPER B COMPLEX PO) Take by mouth.  . cholecalciferol (VITAMIN D) 1000 units tablet Take 1,000 Units by mouth daily.  . Prenatal Vit-Fe Fumarate-FA (MULTIVITAMIN-PRENATAL) 27-0.8 MG TABS tablet Take 1 tablet by mouth daily at 12 noon.  Marland Kitchen. VITAMIN E PO Take 30 mg by mouth.  . [DISCONTINUED] vitamin E 100 UNIT capsule Take 30 Units by mouth daily.   . [DISCONTINUED] vitamin E 1000 UNIT capsule Take 1,000 Units by mouth daily.   No Known Allergies No results found for this or any previous visit (from the past 2160 hour(s)). Objective  Body mass index is 22.02 kg/m. Wt Readings from Last 3 Encounters:  06/29/17 144 lb 12.8 oz (65.7 kg)  12/20/16 150 lb (68 kg)  09/03/16 141 lb (64 kg)   Temp Readings from Last 3 Encounters:  06/29/17 98.2 F (36.8 C) (Oral)  12/07/16 97.9 F (36.6 C)   BP Readings from Last 3 Encounters:  06/29/17 104/68  12/20/16 110/68  12/07/16 107/71   Pulse Readings from Last 3  Encounters:  06/29/17 62  12/07/16 (!) 120  09/03/16 84    Physical Exam  Constitutional: She is oriented to person, place, and time. Vital signs are normal. She appears well-developed and well-nourished. She is cooperative.  HENT:  Head: Normocephalic and atraumatic.  Mouth/Throat: Oropharynx is clear and moist and mucous membranes are normal.  Eyes: Pupils are equal, round, and reactive to light. Left conjunctiva is injected.    Cardiovascular: Normal rate, regular rhythm and normal heart sounds.  Pulmonary/Chest: Effort normal and breath sounds normal.  Abdominal: Soft. Bowel sounds are normal. There is no tenderness.  Neurological: She is alert and oriented to person, place, and time. Gait normal.  Skin: Skin is warm, dry and intact.  Benign nevi   Psychiatric: She has a normal mood and affect. Her speech is normal and behavior is normal. Judgment and thought content normal. Cognition and memory are normal.  Nursing note and vitals reviewed.   Assessment   1. Annual exam  2. HM  Plan  1.  Check labs CMET, CBC, TSH, T4, UA, hep B, lipid, vit D  Cont healthy diet choices and exercise  Cont use SPF sunscreen  2.  Had flu shot 12/2016  Check Hep B status  Tdap had w/in last 7 years.   Eye MD Dr. Larence PenningNice Dentist Dr. Leonidas Rombergroutman  [facility] 06/23/17   Provider: Dr. French Anaracy McLean-[facility]oc[facility]

## 2017-07-05 ENCOUNTER — Encounter: Payer: Self-pay | Admitting: Internal Medicine

## 2017-07-15 ENCOUNTER — Other Ambulatory Visit: Payer: 59

## 2017-07-19 ENCOUNTER — Other Ambulatory Visit (INDEPENDENT_AMBULATORY_CARE_PROVIDER_SITE_OTHER): Payer: 59

## 2017-07-19 DIAGNOSIS — Z1389 Encounter for screening for other disorder: Secondary | ICD-10-CM | POA: Diagnosis not present

## 2017-07-19 DIAGNOSIS — Z1329 Encounter for screening for other suspected endocrine disorder: Secondary | ICD-10-CM

## 2017-07-19 DIAGNOSIS — Z13220 Encounter for screening for lipoid disorders: Secondary | ICD-10-CM

## 2017-07-19 DIAGNOSIS — Z0184 Encounter for antibody response examination: Secondary | ICD-10-CM

## 2017-07-19 DIAGNOSIS — D649 Anemia, unspecified: Secondary | ICD-10-CM

## 2017-07-19 DIAGNOSIS — Z1159 Encounter for screening for other viral diseases: Secondary | ICD-10-CM

## 2017-07-19 DIAGNOSIS — Z0000 Encounter for general adult medical examination without abnormal findings: Secondary | ICD-10-CM | POA: Diagnosis not present

## 2017-07-19 DIAGNOSIS — E559 Vitamin D deficiency, unspecified: Secondary | ICD-10-CM | POA: Diagnosis not present

## 2017-07-19 NOTE — Addendum Note (Signed)
Addended by: Penne Lash on: 07/19/2017 07:59 AM   Modules accepted: Orders

## 2017-07-20 ENCOUNTER — Encounter (HOSPITAL_COMMUNITY): Payer: Self-pay

## 2017-07-20 LAB — COMPREHENSIVE METABOLIC PANEL
AG Ratio: 1.5 (calc) (ref 1.0–2.5)
ALT: 12 U/L (ref 6–29)
AST: 16 U/L (ref 10–30)
Albumin: 4.3 g/dL (ref 3.6–5.1)
Alkaline phosphatase (APISO): 38 U/L (ref 33–115)
BILIRUBIN TOTAL: 0.6 mg/dL (ref 0.2–1.2)
BUN: 19 mg/dL (ref 7–25)
CALCIUM: 9.2 mg/dL (ref 8.6–10.2)
CO2: 24 mmol/L (ref 20–32)
Chloride: 104 mmol/L (ref 98–110)
Creat: 0.87 mg/dL (ref 0.50–1.10)
Globulin: 2.8 g/dL (calc) (ref 1.9–3.7)
Glucose, Bld: 89 mg/dL (ref 65–99)
POTASSIUM: 4.3 mmol/L (ref 3.5–5.3)
Sodium: 137 mmol/L (ref 135–146)
Total Protein: 7.1 g/dL (ref 6.1–8.1)

## 2017-07-20 LAB — CBC WITH DIFFERENTIAL/PLATELET
BASOS ABS: 62 {cells}/uL (ref 0–200)
Basophils Relative: 1.2 %
EOS PCT: 1.7 %
Eosinophils Absolute: 88 cells/uL (ref 15–500)
HEMATOCRIT: 36.2 % (ref 35.0–45.0)
Hemoglobin: 12.6 g/dL (ref 11.7–15.5)
LYMPHS ABS: 1919 {cells}/uL (ref 850–3900)
MCH: 31.4 pg (ref 27.0–33.0)
MCHC: 34.8 g/dL (ref 32.0–36.0)
MCV: 90.3 fL (ref 80.0–100.0)
MONOS PCT: 7.7 %
MPV: 12 fL (ref 7.5–12.5)
NEUTROS ABS: 2730 {cells}/uL (ref 1500–7800)
NEUTROS PCT: 52.5 %
Platelets: 222 10*3/uL (ref 140–400)
RBC: 4.01 10*6/uL (ref 3.80–5.10)
RDW: 12.7 % (ref 11.0–15.0)
Total Lymphocyte: 36.9 %
WBC mixed population: 400 cells/uL (ref 200–950)
WBC: 5.2 10*3/uL (ref 3.8–10.8)

## 2017-07-20 LAB — URINALYSIS, ROUTINE W REFLEX MICROSCOPIC
BILIRUBIN UA: NEGATIVE
Glucose, UA: NEGATIVE
Ketones, UA: NEGATIVE
Nitrite, UA: NEGATIVE
PH UA: 6.5 (ref 5.0–7.5)
PROTEIN UA: NEGATIVE
RBC UA: NEGATIVE
Specific Gravity, UA: 1.014 (ref 1.005–1.030)
Urobilinogen, Ur: 0.2 mg/dL (ref 0.2–1.0)

## 2017-07-20 LAB — LIPID PANEL
CHOLESTEROL: 152 mg/dL (ref <200)
HDL: 63 mg/dL (ref >50)
LDL CHOLESTEROL (CALC): 75 mg/dL
Non-HDL Cholesterol (Calc): 89 mg/dL (calc) (ref <130)
Total CHOL/HDL Ratio: 2.4 (calc) (ref <5.0)
Triglycerides: 59 mg/dL (ref <150)

## 2017-07-20 LAB — VITAMIN D 25 HYDROXY (VIT D DEFICIENCY, FRACTURES): VIT D 25 HYDROXY: 48 ng/mL (ref 30–100)

## 2017-07-20 LAB — MICROSCOPIC EXAMINATION: CASTS: NONE SEEN /LPF

## 2017-07-20 LAB — HEPATITIS B SURFACE ANTIBODY, QUANTITATIVE: Hepatitis B-Post: 463 m[IU]/mL (ref >=10)

## 2017-07-20 LAB — T4, FREE: FREE T4: 1 ng/dL (ref 0.8–1.8)

## 2017-07-20 LAB — TSH: TSH: 2.6 m[IU]/L

## 2017-07-26 ENCOUNTER — Other Ambulatory Visit: Payer: Self-pay | Admitting: Obstetrics and Gynecology

## 2017-07-26 DIAGNOSIS — N926 Irregular menstruation, unspecified: Secondary | ICD-10-CM

## 2017-07-27 ENCOUNTER — Telehealth: Payer: Self-pay | Admitting: Obstetrics and Gynecology

## 2017-07-27 NOTE — Telephone Encounter (Signed)
-----   Message from Vena Austria, MD sent at 07/26/2017  1:35 PM EDT ----- Regarding: Labs Lab visit not time specific

## 2017-07-27 NOTE — Telephone Encounter (Signed)
Called and left voice mail for patient to call back to be schedule °

## 2017-07-28 ENCOUNTER — Other Ambulatory Visit: Payer: 59

## 2017-07-28 DIAGNOSIS — N926 Irregular menstruation, unspecified: Secondary | ICD-10-CM | POA: Diagnosis not present

## 2017-07-30 LAB — TSH+PRL+FSH+TESTT+LH+DHEA S...
17 HYDROXYPROGESTERONE: 34 ng/dL
ANDROSTENEDIONE: 88 ng/dL (ref 41–262)
DHEA SO4: 268.3 ug/dL (ref 84.8–378.0)
FSH: 5.1 m[IU]/mL
LH: 8.9 m[IU]/mL
Prolactin: 5.9 ng/mL (ref 4.8–23.3)
TESTOSTERONE FREE: 1.2 pg/mL (ref 0.0–4.2)
TSH: 1.93 u[IU]/mL (ref 0.450–4.500)
Testosterone: 62 ng/dL — ABNORMAL HIGH (ref 8–48)

## 2017-08-01 ENCOUNTER — Encounter (INDEPENDENT_AMBULATORY_CARE_PROVIDER_SITE_OTHER): Payer: Self-pay

## 2017-08-19 ENCOUNTER — Other Ambulatory Visit: Payer: Self-pay | Admitting: Obstetrics and Gynecology

## 2017-08-19 DIAGNOSIS — N979 Female infertility, unspecified: Secondary | ICD-10-CM

## 2017-08-19 MED ORDER — LETROZOLE 2.5 MG PO TABS: 2.5000 mg | ORAL_TABLET | Freq: Every day | ORAL | 0 refills | Status: AC

## 2017-08-19 NOTE — Progress Notes (Signed)
LMP 08/19/17 cycle I letrozole 2.5mg  day 21 progesterone on Thursday 09/08/2016

## 2017-08-23 ENCOUNTER — Other Ambulatory Visit: Payer: Self-pay | Admitting: Obstetrics and Gynecology

## 2017-08-23 MED ORDER — MEDROXYPROGESTERONE ACETATE 10 MG PO TABS: 10.0000 mg | ORAL_TABLET | Freq: Every day | ORAL | 0 refills | Status: DC

## 2017-08-23 NOTE — Progress Notes (Signed)
Unsure if LMP truly LMP so rather than letrozole will obtain withdrawal bleed with provera before starting letrozole

## 2017-09-08 ENCOUNTER — Other Ambulatory Visit: Payer: 59

## 2017-09-25 ENCOUNTER — Other Ambulatory Visit: Payer: Self-pay | Admitting: Obstetrics and Gynecology

## 2017-09-25 DIAGNOSIS — N970 Female infertility associated with anovulation: Secondary | ICD-10-CM

## 2017-09-25 MED ORDER — LETROZOLE 2.5 MG PO TABS: 5.0000 mg | ORAL_TABLET | Freq: Every day | ORAL | 0 refills | Status: DC

## 2017-09-25 NOTE — Progress Notes (Signed)
LMP 09/24/2017 Cycle I letrozole 5mg   Day 21 progesterone 10/14/17

## 2017-09-26 ENCOUNTER — Telehealth: Payer: Self-pay | Admitting: Obstetrics and Gynecology

## 2017-09-26 NOTE — Telephone Encounter (Signed)
Patient is schedule 10/14/17

## 2017-09-26 NOTE — Telephone Encounter (Signed)
-----   Message from Vena AustriaAndreas Staebler, MD sent at 09/25/2017  9:35 AM EDT ----- Regarding: Labs Need labs 10/14/17

## 2017-09-26 NOTE — Telephone Encounter (Signed)
Called and left voice mail for patient to call back to be schedule °

## 2017-10-14 ENCOUNTER — Other Ambulatory Visit: Payer: 59

## 2017-10-14 DIAGNOSIS — N970 Female infertility associated with anovulation: Secondary | ICD-10-CM

## 2017-10-14 DIAGNOSIS — N979 Female infertility, unspecified: Secondary | ICD-10-CM

## 2017-10-15 ENCOUNTER — Encounter (INDEPENDENT_AMBULATORY_CARE_PROVIDER_SITE_OTHER): Payer: Self-pay

## 2017-10-15 LAB — PROGESTERONE: Progesterone: 14.7 ng/mL

## 2017-10-25 ENCOUNTER — Other Ambulatory Visit: Payer: Self-pay | Admitting: Obstetrics and Gynecology

## 2017-10-25 MED ORDER — LETROZOLE 2.5 MG PO TABS: 5.0000 mg | ORAL_TABLET | Freq: Every day | ORAL | 0 refills | Status: DC

## 2017-10-25 NOTE — Progress Notes (Signed)
LMP 10/24/2017 Cycle II letrozole 5mg 

## 2017-11-28 ENCOUNTER — Other Ambulatory Visit: Payer: Self-pay | Admitting: Obstetrics and Gynecology

## 2017-11-28 MED ORDER — LETROZOLE 2.5 MG PO TABS
5.0000 mg | ORAL_TABLET | Freq: Every day | ORAL | 0 refills | Status: DC
Start: 2017-11-28 — End: 2018-10-24

## 2017-11-28 NOTE — Progress Notes (Signed)
LMP 11/27/17 Cycle III Letrozole 5mg 

## 2018-02-21 DIAGNOSIS — H524 Presbyopia: Secondary | ICD-10-CM | POA: Diagnosis not present

## 2018-02-21 DIAGNOSIS — H5213 Myopia, bilateral: Secondary | ICD-10-CM | POA: Diagnosis not present

## 2018-02-21 DIAGNOSIS — H52223 Regular astigmatism, bilateral: Secondary | ICD-10-CM | POA: Diagnosis not present

## 2018-05-17 ENCOUNTER — Ambulatory Visit: Payer: Self-pay | Admitting: Physician Assistant

## 2018-05-17 VITALS — BP 120/70 | HR 64 | Temp 97.8°F | Resp 16 | Wt 143.0 lb

## 2018-05-17 DIAGNOSIS — J069 Acute upper respiratory infection, unspecified: Secondary | ICD-10-CM

## 2018-05-17 DIAGNOSIS — R5383 Other fatigue: Secondary | ICD-10-CM

## 2018-05-17 LAB — POCT INFLUENZA A/B
INFLUENZA A, POC: NEGATIVE
Influenza B, POC: NEGATIVE

## 2018-05-17 MED ORDER — PSEUDOEPH-BROMPHEN-DM 30-2-10 MG/5ML PO SYRP: 5.0000 mL | ORAL_SOLUTION | Freq: Four times a day (QID) | ORAL | 0 refills | Status: DC | PRN

## 2018-05-17 MED ORDER — FLUTICASONE PROPIONATE 50 MCG/ACT NA SUSP: 2.0000 | Freq: Every day | NASAL | 0 refills | Status: DC

## 2018-05-17 MED ORDER — SALINE SPRAY 0.65 % NA SOLN: 1.0000 | NASAL | 0 refills | Status: DC | PRN

## 2018-05-17 NOTE — Patient Instructions (Signed)
Viral Respiratory Infection  Start Bromfed, Flonase, nasal saline for congestion. May use over-the-counter ibuprofen or Tylenol as prescribed for sore throat.  Can also drink warm tea with honey, over-the-counter ginger. If sinus pressure worsens over the next 5 days, please contact our office.  If any of your symptoms do not improve over the next 7 to 10 days, please follow-up with family doctor or local urgent care.  If any of your symptoms worsen or you develop new concerning symptoms and please seek care sooner.  A viral respiratory infection is an illness that affects parts of the body that are used for breathing. These include the lungs, nose, and throat. It is caused by a germ called a virus. Some examples of this kind of infection are:  A cold.  The flu (influenza).  A respiratory syncytial virus (RSV) infection. A person who gets this illness may have the following symptoms:  A stuffy or runny nose.  Yellow or green fluid in the nose.  A cough.  Sneezing.  Tiredness (fatigue).  Achy muscles.  A sore throat.  Sweating or chills.  A fever.  A headache. Follow these instructions at home: Managing pain and congestion  Take over-the-counter and prescription medicines only as told by your doctor.  If you have a sore and general discomfort., gargle with salt water. Do this 3-4 times per day or as needed. To make a salt-water mixture, dissolve -1 tsp of salt in 1 cup of warm water. Make sure that all the salt dissolves.  Use nose drops made from salt water. This helps with stuffiness (congestion). It also helps soften the skin around your nose.  Drink enough fluid to keep your pee (urine) pale yellow. General instructions   Rest as much as possible.  Do not drink alcohol.  Do not use any products that have nicotine or tobacco, such as cigarettes and e-cigarettes. If you need help quitting, ask your doctor.  Keep all follow-up visits as told by your doctor.  This is important. How is this prevented?   Get a flu shot every year. Ask your doctor when you should get your flu shot.  Do not let other people get your germs. If you are sick: ? Stay home from work or school. ? Wash your hands with soap and water often. Wash your hands after you cough or sneeze. If soap and water are not available, use hand sanitizer.  Avoid contact with people who are sick during cold and flu season. This is in fall and winter. Get help if:  Your symptoms last for 10 days or longer.  Your symptoms get worse over time.  You have a fever.  You have very bad pain in your face or forehead.  Parts of your jaw or neck become very swollen. Get help right away if:  You feel pain or pressure in your chest.  You have shortness of breath.  You faint or feel like you will faint.  You keep throwing up (vomiting).  You feel confused. Summary  A viral respiratory infection is an illness that affects parts of the body that are used for breathing.  Examples of this illness include a cold, the flu, and respiratory syncytial virus (RSV) infection.  The infection can cause a runny nose, cough, sneezing, sore throat, and fever.  Follow what your doctor tells you about taking medicines, drinking lots of fluid, washing your hands, resting at home, and avoiding people who are sick. This information is not intended to  replace advice given to you by your health care provider. Make sure you discuss any questions you have with your health care provider. Document Released: 02/12/2008 Document Revised: 04/11/2017 Document Reviewed: 04/11/2017 Elsevier Interactive Patient Education  2019 [employer].

## 2018-05-17 NOTE — Progress Notes (Signed)
MRN: [id number] DOB: 10-11-1985  Subjective:   Brittany Good is a 33 y.o. female presenting for chief complaint of Fatigue (x3do ); Nasal Congestion (x3d); and Sore Throat (x3d ) .  Reports 3 day history of gradual onset scratchy throat, sinus congestion , rhinorrhea, ear fullness, dry cough, fatigue. Has tried nyquil with some relief. Denies fever, sinus pain, inability to swallow, voice change, productive cough, wheezing, shortness of breath, chest pain and myalgia, nausea, vomiting, abdominal pain and diarrhea.  After she developed a scratchy throat, she was exposed to someone at work with influenza.  Would like to be tested for influenza today.  Denies history of seasonal allergies, asthma, diabetes, heart disease, and autoimmune disease. Patient has had flu shot this season.  Denies smoking.  Denies recent travel.  LMP 2 weeks ago.  Denies any other aggravating or relieving factors, no other questions or concerns.  Review of Systems  Constitutional: Negative for diaphoresis and weight loss.  Respiratory: Negative for hemoptysis.   Musculoskeletal: Negative for neck pain.  Skin: Negative for rash.  Neurological: Negative for dizziness.    Brittany Good has a current medication list which includes the following prescription(s): b complex-c, brompheniramine-pseudoephedrine-dm, cholecalciferol, fluticasone, letrozole, multivitamin-prenatal, sodium chloride, and vitamin e. Also has No Known Allergies.  Brittany Good  has a past medical history of Acne and Miscarriage. Also  has a past surgical history that includes Breast enhancement surgery; LASIK; Tonsillectomy and adenoidectomy; Dilation and evacuation (N/A, 12/07/2016); LASIK; Breast enhancement surgery; Dilation and curettage of uterus; and Tonsillectomy.   Objective:   Vitals: BP 120/70 (BP Location: Right Arm, Patient Position: Sitting, Cuff Size: Normal)   Pulse 64   Temp 97.8 F (36.6 C)   Resp 16   Wt 143 lb (64.9 kg)   SpO2 98%   BMI  21.74 kg/m   Physical Exam Vitals signs reviewed.  Constitutional:      General: She is not in acute distress.    Appearance: She is well-developed. She is not ill-appearing or toxic-appearing.  HENT:     Head: Normocephalic and atraumatic.     Right Ear: Ear canal and external ear normal. A middle ear effusion is present. Tympanic membrane is not erythematous or bulging.     Left Ear: Ear canal and external ear normal. A middle ear effusion is present. Tympanic membrane is not erythematous or bulging.     Nose: Mucosal edema, congestion and rhinorrhea present. Rhinorrhea is clear.     Right Sinus: No maxillary sinus tenderness or frontal sinus tenderness.     Left Sinus: No maxillary sinus tenderness or frontal sinus tenderness.     Mouth/Throat:     Lips: Pink.     Mouth: Mucous membranes are moist.     Pharynx: Uvula midline. Posterior oropharyngeal erythema present. No pharyngeal swelling, oropharyngeal exudate or uvula swelling.     Tonsils: Swelling: 0 on the right. 0 on the left.     Comments: S/p tonsillectomy  Eyes:     Conjunctiva/sclera: Conjunctivae normal.  Neck:     Musculoskeletal: Normal range of motion.  Cardiovascular:     Rate and Rhythm: Normal rate and regular rhythm.     Heart sounds: Normal heart sounds.  Pulmonary:     Effort: Pulmonary effort is normal.     Breath sounds: Normal breath sounds. No decreased breath sounds, wheezing, rhonchi or rales.  Abdominal:     General: Abdomen is flat.     Palpations: Abdomen is soft. There is no  hepatomegaly or splenomegaly.     Tenderness: There is no abdominal tenderness.  Lymphadenopathy:     Head:     Right side of head: No submental, submandibular, tonsillar, preauricular, posterior auricular or occipital adenopathy.     Left side of head: No submental, submandibular, tonsillar, preauricular, posterior auricular or occipital adenopathy.     Cervical: Cervical adenopathy present.     Right cervical:  Superficial cervical adenopathy present.     Left cervical: Superficial cervical adenopathy present.     Upper Body:     Right upper body: No supraclavicular adenopathy.     Left upper body: No supraclavicular adenopathy.  Skin:    General: Skin is warm and dry.  Neurological:     Mental Status: She is alert.     Results for orders placed or performed in visit on 05/17/18 (from the past 24 hour(s))  POCT Influenza A/B     Status: Normal   Collection Time: 05/17/18  4:54 PM  Result Value Ref Range   Influenza A, POC Negative Negative   Influenza B, POC Negative Negative    Assessment and Plan :  1. Viral URI Patient is overall well-appearing, no acute distress.  VSS.  Point-of-care flu test negative, patient reassured.  History and physical exam are consistent with viral URI.  Given educational material on viral URI.  Recommend symptomatic treatment at this time.  Advised to contact office if sinus pressure persists/worsens over the next 5 days, would consider Rx for antibiotic at that time.  Advised to follow-up with family doctor, or local urgent care if no improvement in symptoms after 7 to 10 days.  Seek care sooner at local urgent care or ED if symptoms worsen/develop new concerning symptoms.  Patient voices understanding. - brompheniramine-pseudoephedrine-DM 30-2-10 MG/5ML syrup; Take 5 mLs by mouth 4 (four) times daily as needed.  Dispense: 120 mL; Refill: 0 - fluticasone (FLONASE) 50 MCG/ACT nasal spray; Place 2 sprays into both nostrils daily.  Dispense: 16 g; Refill: 0 - sodium chloride (OCEAN) 0.65 % SOLN nasal spray; Place 1 spray into both nostrils as needed.  Dispense: 60 mL; Refill: 0  2. Fatigue, unspecified type - POCT Influenza A/B   Benjiman Core, PA-C  [facility] 05/17/2018 4:54 PM

## 2018-05-19 ENCOUNTER — Telehealth: Payer: Self-pay | Admitting: Emergency Medicine

## 2018-05-19 NOTE — Telephone Encounter (Signed)
Left message following up on visit with [facility] 

## 2018-06-26 ENCOUNTER — Telehealth: Payer: Self-pay

## 2018-06-26 NOTE — Telephone Encounter (Signed)
Copied from CRM [id number]. Topic: General - Other >> Jun 23, 2018  8:52 AM Percival Spanish wrote: Pt left VM on [facility] appt line stating she was returning a call to reschedule her CPE and would like a call back

## 2018-06-29 NOTE — Telephone Encounter (Signed)
Left msg for pt to call back

## 2018-07-03 ENCOUNTER — Encounter: Payer: 59 | Admitting: Internal Medicine

## 2018-07-04 ENCOUNTER — Encounter: Payer: 59 | Admitting: Internal Medicine

## 2018-10-17 ENCOUNTER — Other Ambulatory Visit: Payer: Self-pay | Admitting: Obstetrics and Gynecology

## 2018-10-17 DIAGNOSIS — O3680X0 Pregnancy with inconclusive fetal viability, not applicable or unspecified: Secondary | ICD-10-CM

## 2018-10-17 DIAGNOSIS — Z8759 Personal history of other complications of pregnancy, childbirth and the puerperium: Secondary | ICD-10-CM

## 2018-10-19 ENCOUNTER — Telehealth: Payer: Self-pay | Admitting: Obstetrics and Gynecology

## 2018-10-19 ENCOUNTER — Other Ambulatory Visit: Payer: Self-pay

## 2018-10-19 ENCOUNTER — Other Ambulatory Visit: Payer: 59

## 2018-10-19 DIAGNOSIS — O3680X0 Pregnancy with inconclusive fetal viability, not applicable or unspecified: Secondary | ICD-10-CM

## 2018-10-19 DIAGNOSIS — Z8759 Personal history of other complications of pregnancy, childbirth and the puerperium: Secondary | ICD-10-CM | POA: Diagnosis not present

## 2018-10-19 NOTE — Telephone Encounter (Signed)
Done

## 2018-10-19 NOTE — Telephone Encounter (Signed)
-----   Message from Malachy Mood, MD sent at 10/19/2018 11:25 AM EDT ----- Regarding: Labs Needs labs today 8/6 and Monday 8/11, orders are in

## 2018-10-20 LAB — BETA HCG QUANT (REF LAB): hCG Quant: 306 m[IU]/mL

## 2018-10-20 NOTE — Progress Notes (Signed)
Needs an appointment with me Tuesday 8/11

## 2018-10-23 ENCOUNTER — Other Ambulatory Visit: Payer: Self-pay

## 2018-10-23 ENCOUNTER — Other Ambulatory Visit: Payer: 59

## 2018-10-23 DIAGNOSIS — O3680X0 Pregnancy with inconclusive fetal viability, not applicable or unspecified: Secondary | ICD-10-CM | POA: Diagnosis not present

## 2018-10-23 DIAGNOSIS — Z8759 Personal history of other complications of pregnancy, childbirth and the puerperium: Secondary | ICD-10-CM | POA: Diagnosis not present

## 2018-10-24 ENCOUNTER — Other Ambulatory Visit: Payer: Self-pay

## 2018-10-24 ENCOUNTER — Encounter: Payer: Self-pay | Admitting: Obstetrics and Gynecology

## 2018-10-24 ENCOUNTER — Ambulatory Visit (INDEPENDENT_AMBULATORY_CARE_PROVIDER_SITE_OTHER): Payer: 59 | Admitting: Obstetrics and Gynecology

## 2018-10-24 VITALS — BP 114/78 | HR 84 | Wt 148.0 lb

## 2018-10-24 DIAGNOSIS — Z8759 Personal history of other complications of pregnancy, childbirth and the puerperium: Secondary | ICD-10-CM

## 2018-10-24 DIAGNOSIS — Z3491 Encounter for supervision of normal pregnancy, unspecified, first trimester: Secondary | ICD-10-CM

## 2018-10-24 LAB — BETA HCG QUANT (REF LAB): hCG Quant: 2136 m[IU]/mL

## 2018-10-24 NOTE — Progress Notes (Signed)
Obstetrics & Gynecology Office Visit   Chief Complaint:  Chief Complaint  Patient presents with  . Follow-up    Discuss labwork    History of Present Illness: 33 y.o. [id number] presenting for early pregnancy, in setting of miscarriage within the last 12 months.  HCG trend has been appropriate.  No vaginal bleeding.  Some nausea.    Review of Systems: ROS otherwise negative  Past Medical History:  Past Medical History:  Diagnosis Date  . Acne   . Miscarriage     Past Surgical History:  Past Surgical History:  Procedure Laterality Date  . BREAST ENHANCEMENT SURGERY    . BREAST ENHANCEMENT SURGERY     2014  . DILATION AND CURETTAGE OF UTERUS     2018  . DILATION AND EVACUATION N/A 12/07/2016   Procedure: DILATATION AND EVACUATION;  Surgeon: Conard NovakJackson, Stephen D, MD;  Location: [facility] ORS;  Service: Gynecology;  Laterality: N/A;  . LASIK    . LASIK     2009  . TONSILLECTOMY     1992  . TONSILLECTOMY AND ADENOIDECTOMY      Gynecologic History: No LMP recorded.  Obstetric History: [id number]  Family History:  Family History  Problem Relation Age of Onset  . Diabetes Maternal Grandfather   . Cancer Maternal Grandfather   . Cancer Maternal Grandmother        ? type  . Cancer Paternal Grandmother        lung smoker   . Depression Paternal Grandfather     Social History:  Social History   Socioeconomic History  . Marital status: Married    Spouse name: Not on file  . Number of children: Not on file  . Years of education: Not on file  . Highest education level: Not on file  Occupational History  . Not on file  Social Needs  . Financial resource strain: Not on file  . Food insecurity    Worry: Not on file    Inability: Not on file  . Transportation needs    Medical: Not on file    Non-medical: Not on file  Tobacco Use  . Smoking status: Never Smoker  . Smokeless tobacco: Never Used  Substance and Sexual Activity  . Alcohol use: Yes    Comment: LIGHT  .  Drug use: No  . Sexual activity: Yes  Lifestyle  . Physical activity    Days per week: Not on file    Minutes per session: Not on file  . Stress: Not on file  Relationships  . Social Musicianconnections    Talks on phone: Not on file    Gets together: Not on file    Attends religious service: Not on file    Active member of club or organization: Not on file    Attends meetings of clubs or organizations: Not on file    Relationship status: Not on file  . Intimate partner violence    Fear of current or ex partner: Not on file    Emotionally abused: Not on file    Physically abused: Not on file    Forced sexual activity: Not on file  Other Topics Concern  . 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 Authorizing Provider  Multiple Vitamins-Minerals (WOMENS MULTIVITAMIN PLUS PO) Take by mouth.   Yes [provider]  cholecalciferol (VITAMIN D) 1000 units tablet Take 1,000 Units by mouth daily.    [provider]    Physical Exam Vitals:  Vitals:   10/24/18 1033  BP: 114/78  Pulse: 84   No LMP recorded.  General: NAD HEENT: normocephalic, anicteric Pulmonary: No increased work of breathing Neurologic: Grossly intact Psychiatric: mood appropriate, affect full  Bedside ultrasound reveals and early IUP, no ys or fetal pole.  Good decidual reaction.    Female chaperone present for pelvic  portions of the physical exam  Results for orders placed or performed in visit on 10/23/18 (from the past 72 hour(s))  Beta hCG quant (ref lab)     Status: None   Collection Time: 10/23/18 10:34 AM  Result Value Ref Range   hCG Quant 2,136 mIU/mL    Comment:                      Female  (Non-pregnant)    0 -     5                             (Postmenopausal)  0 -     8                      Female (Pregnant)                      Weeks of Gestation                              3                6 -    71                              4               10 -   750                              5              217 -  7138                              6              158 - 31795                              7             [id number] [id number]                              8            [id number]  [place]                             18             8099 - [id number] Roche E CLIA methodology      Assessment: 33 y.o. [id number] follow up positive UPT  In setting of recent miscarriage  Plan: Problem List Items Addressed This Visit    None    Visit Diagnoses    Normal pregnancy in first trimester    -  Primary     - bedside ultrasound reveals early IUP - appropriate rise in HCG levels - no changes in family or personal history to impact care or additional testing this pregnancy - taking OTC PNV - Return in about 1 week (around 10/31/2018) for NOB (Loraina Stauffer).   Malachy Mood, MD, [facility], [facility] 10/24/2018, 1:04 PM

## 2018-10-31 ENCOUNTER — Telehealth: Payer: Self-pay | Admitting: Obstetrics and Gynecology

## 2018-10-31 ENCOUNTER — Other Ambulatory Visit: Payer: Self-pay | Admitting: Obstetrics and Gynecology

## 2018-10-31 ENCOUNTER — Ambulatory Visit: Payer: 59

## 2018-10-31 DIAGNOSIS — Z3689 Encounter for other specified antenatal screening: Secondary | ICD-10-CM

## 2018-10-31 DIAGNOSIS — O209 Hemorrhage in early pregnancy, unspecified: Secondary | ICD-10-CM

## 2018-10-31 NOTE — Telephone Encounter (Signed)
-----   Message from Malachy Mood, MD sent at 10/31/2018  9:28 AM EDT ----- Regarding: Ultrasound today Can we get Brittany Good in for an ultrasound today for first trimester bleeding.  Order is placed.  She can A) chose to follow up with a provider that is on today B) have me call her with the results

## 2018-10-31 NOTE — Telephone Encounter (Signed)
Appointment per patient to cancel ultrasound schedule for today 10/31/18 at 12

## 2018-10-31 NOTE — Telephone Encounter (Signed)
Patient is schedule today 10/31/18 at 12:00. Patient aware of appointment via mychart

## 2018-10-31 NOTE — Telephone Encounter (Signed)
Unable to reach patient Via phone. No Service or ring when attempt to call . Sen my chart message to reach patient

## 2018-11-02 ENCOUNTER — Other Ambulatory Visit (HOSPITAL_COMMUNITY)
Admission: RE | Admit: 2018-11-02 | Discharge: 2018-11-02 | Disposition: A | Discharge status: Home / Self Care | Payer: 59 | Source: Ambulatory Visit | Attending: Obstetrics and Gynecology | Admitting: Obstetrics and Gynecology

## 2018-11-02 ENCOUNTER — Ambulatory Visit (INDEPENDENT_AMBULATORY_CARE_PROVIDER_SITE_OTHER): Payer: 59 | Admitting: Obstetrics and Gynecology

## 2018-11-02 ENCOUNTER — Encounter: Payer: Self-pay | Admitting: Obstetrics and Gynecology

## 2018-11-02 ENCOUNTER — Other Ambulatory Visit: Payer: Self-pay

## 2018-11-02 VITALS — BP 113/69 | Wt 150.0 lb

## 2018-11-02 DIAGNOSIS — Z3A01 Less than 8 weeks gestation of pregnancy: Secondary | ICD-10-CM

## 2018-11-02 DIAGNOSIS — Z113 Encounter for screening for infections with a predominantly sexual mode of transmission: Secondary | ICD-10-CM | POA: Diagnosis not present

## 2018-11-02 DIAGNOSIS — Z124 Encounter for screening for malignant neoplasm of cervix: Secondary | ICD-10-CM | POA: Insufficient documentation

## 2018-11-02 DIAGNOSIS — O09529 Supervision of elderly multigravida, unspecified trimester: Secondary | ICD-10-CM

## 2018-11-02 DIAGNOSIS — Z3480 Encounter for supervision of other normal pregnancy, unspecified trimester: Secondary | ICD-10-CM | POA: Insufficient documentation

## 2018-11-02 DIAGNOSIS — O09521 Supervision of elderly multigravida, first trimester: Secondary | ICD-10-CM

## 2018-11-02 NOTE — Progress Notes (Signed)
New Obstetric Patient H&P    Chief Complaint: "Desires prenatal care"   History of Present Illness: Patient is a 33 y.o. P6P9509 Not Hispanic or Latino female, presents with amenorrhea and positive home pregnancy test. Patient's last menstrual period was 09/18/2018 (exact date). and based on her  LMP, her EDD is Estimated Date of Delivery: 06/25/19 and her EGA is [redacted]w[redacted]d.  Her last pap smear was 01/30/2016 and was no abnormalities.   Her last menstrual period was normal . Since her LMP she claims she has experienced nausea, fatigue, breast tenderness. She did develop vaginal bleeding, limited to spotting early this week.  Currently only some light brown.. Her past medical history is noncontributory. Her prior pregnancies are notable for none  Since her LMP, she admits to the use of tobacco products  no There are cats in the home in the home  no She admits close contact with children on a regular basis  yes  She has had chicken pox in the past no She has had Tuberculosis exposures, symptoms, or previously tested positive for TB   no Current or past history of domestic violence. no  Genetic Screening/Teratology Counseling: (Includes patient, baby's father, or anyone in either family with:)   34. Patient's age >/= 13 at [facility]  Yes 2. Thalassemia ([place], [place], [place], or Asian background): MCV<80  no 3. Neural tube defect (meningomyelocele, spina bifida, anencephaly)  no 4. Congenital heart defect  no  5. Down syndrome  no 6. Tay-Sachs (Jewish, [place])  no 7. Canavan's Disease  no 8. Sickle cell disease or trait (African)  no  9. Hemophilia or other blood disorders  no  10. Muscular dystrophy  no  11. Cystic fibrosis  no  12. Huntington's Chorea  no  13. Mental retardation/autism  no 14. Other inherited genetic or chromosomal disorder  no 15. Maternal metabolic disorder (DM, PKU, etc)  no 16. Patient or FOB with a child with a birth defect not listed above no  16a.  Patient or FOB with a birth defect themselves no 17. Recurrent pregnancy loss, or stillbirth  no  18. Any medications since LMP other than prenatal vitamins (include vitamins, supplements, OTC meds, drugs, alcohol)  no 19. Any other genetic/environmental exposure to discuss  no  Infection History:   1. Lives with someone with TB or TB exposed  no  2. Patient or partner has history of genital herpes  no 3. Rash or viral illness since LMP  no 4. History of STI (GC, CT, HPV, syphilis, HIV)  no 5. History of recent travel :  no  Other pertinent information:  no     Review of Systems:10 point review of systems negative unless otherwise noted in HPI  Past Medical History:  Past Medical History:  Diagnosis Date  . Acne   . Miscarriage     Past Surgical History:  Past Surgical History:  Procedure Laterality Date  . BREAST ENHANCEMENT SURGERY    . BREAST ENHANCEMENT SURGERY     2014  . DILATION AND CURETTAGE OF UTERUS     2018  . DILATION AND EVACUATION N/A 12/07/2016   Procedure: DILATATION AND EVACUATION;  Surgeon: Will Bonnet, MD;  Location: [facility] ORS;  Service: Gynecology;  Laterality: N/A;  . LASIK    . LASIK     2009  . TONSILLECTOMY     1992  . TONSILLECTOMY AND ADENOIDECTOMY      Gynecologic History: Patient's last menstrual period was 09/18/2018 (exact  date).  Obstetric History: [id number]  Family History:  Family History  Problem Relation Age of Onset  . Diabetes Maternal Grandfather   . Cancer Maternal Grandfather   . Cancer Maternal Grandmother        ? type  . Cancer Paternal Grandmother        lung smoker   . Depression Paternal Grandfather     Social History:  Social History   Socioeconomic History  . Marital status: Married    Spouse name: Not on file  . Number of children: Not on file  . Years of education: Not on file  . Highest education level: Not on file  Occupational History  . Not on file  Social Needs  . Financial resource  strain: Not on file  . Food insecurity    Worry: Not on file    Inability: Not on file  . Transportation needs    Medical: Not on file    Non-medical: Not on file  Tobacco Use  . Smoking status: Never Smoker  . Smokeless tobacco: Never Used  Substance and Sexual Activity  . Alcohol use: Yes    Comment: LIGHT  . Drug use: No  . Sexual activity: Yes  Lifestyle  . Physical activity    Days per week: Not on file    Minutes per session: Not on file  . Stress: Not on file  Relationships  . Social Musicianconnections    Talks on phone: Not on file    Gets together: Not on file    Attends religious service: Not on file    Active member of club or organization: Not on file    Attends meetings of clubs or organizations: Not on file    Relationship status: Not on file  . Intimate partner violence    Fear of current or ex partner: Not on file    Emotionally abused: Not on file    Physically abused: Not on file    Forced sexual activity: Not on file  Other Topics Concern  . 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 Authorizing Provider  Multiple Vitamins-Minerals (WOMENS MULTIVITAMIN PLUS PO) Take by mouth.   Yes [provider]  progesterone 200 MG SUPP Place 200 mg vaginally at bedtime.   Yes [provider]  cholecalciferol (VITAMIN D) 1000 units tablet Take 1,000 Units by mouth daily.    [provider]    Physical Exam Vitals: Blood pressure 113/69, weight 150 lb (68 kg), last menstrual period 09/18/2018, unknown if currently breastfeeding.  General: NAD HEENT: normocephalic, anicteric Thyroid: no enlargement, no palpable nodules Pulmonary: No increased work of  breathing, CTAB Cardiovascular: RRR, distal pulses 2+ Abdomen: NABS, soft, non-tender, non-distended.  Umbilicus without lesions.  No hepatomegaly, splenomegaly or masses palpable. No evidence of hernia  Genitourinary:  External: Normal external female genitalia.  Normal urethral meatus, normal  Bartholin's and Skene's glands.    Vagina: Normal vaginal mucosa, no evidence of prolapse.    Cervix: Grossly normal in appearance, no bleeding  Uterus:  Non-enlarged, mobile, normal contour.  No CMT  Adnexa:  ovaries non-enlarged, no adnexal masses  Rectal: deferred Extremities: no edema, erythema, or tenderness Neurologic: Grossly intact Psychiatric: mood appropriate, affect full  Bedside US CRL c/w LMP EDD at 5847w2d FHT 116  Assessment: 33 y.o. [id number] at 5430w3d presenting to initiate prenatal care  Plan: 1) Avoid alcoholic beverages. 2) Patient encouraged not to smoke.  3) Discontinue the use of all non-medicinal drugs and chemicals.  4) Take prenatal vitamins daily.  5) Nutrition, food safety (fish, cheese advisories, and high nitrite foods) and exercise discussed. 6) Hospital and practice style discussed with cross coverage system.  7) Genetic Screening, such as with 1st Trimester Screening, cell free fetal DNA, AFP testing, and Ultrasound, as well as with amniocentesis and CVS as appropriate, is discussed with patient. At the conclusion of today's visit patient requested genetic testing   Vena AustriaAndreas Zelie Asbill, MD, Merlinda FrederickFACOG [facility], [facility] 11/02/2018, 2:29 PM

## 2018-11-02 NOTE — Progress Notes (Signed)
NOB Spotting

## 2018-11-04 LAB — URINE CULTURE: Organism ID, Bacteria: NO GROWTH

## 2018-11-06 DIAGNOSIS — O09529 Supervision of elderly multigravida, unspecified trimester: Secondary | ICD-10-CM | POA: Insufficient documentation

## 2018-11-06 DIAGNOSIS — Z3480 Encounter for supervision of other normal pregnancy, unspecified trimester: Secondary | ICD-10-CM | POA: Insufficient documentation

## 2018-11-06 LAB — CYTOLOGY - PAP
Chlamydia: NEGATIVE
Diagnosis: NEGATIVE
HPV: NOT DETECTED
Neisseria Gonorrhea: NEGATIVE

## 2018-11-07 ENCOUNTER — Other Ambulatory Visit: Payer: Self-pay

## 2018-11-07 ENCOUNTER — Ambulatory Visit (INDEPENDENT_AMBULATORY_CARE_PROVIDER_SITE_OTHER): Payer: 59 | Admitting: Obstetrics and Gynecology

## 2018-11-07 VITALS — BP 106/70 | Wt 149.0 lb

## 2018-11-07 DIAGNOSIS — O09529 Supervision of elderly multigravida, unspecified trimester: Secondary | ICD-10-CM

## 2018-11-07 DIAGNOSIS — O09521 Supervision of elderly multigravida, first trimester: Secondary | ICD-10-CM

## 2018-11-07 DIAGNOSIS — Z3A01 Less than 8 weeks gestation of pregnancy: Secondary | ICD-10-CM

## 2018-11-07 DIAGNOSIS — Z3480 Encounter for supervision of other normal pregnancy, unspecified trimester: Secondary | ICD-10-CM

## 2018-11-07 NOTE — Progress Notes (Signed)
ROB discharge

## 2018-11-07 NOTE — Progress Notes (Signed)
    Routine Prenatal Care Visit  Subjective  Brittany Good is a 33 y.o. G4P2002 at [redacted]w[redacted]d being seen today for ongoing prenatal care.  She is currently monitored for the following issues for this low-risk pregnancy and has Annual physical exam; Acne; Anemia; Supervision of other normal pregnancy, antepartum; and Advanced maternal age in multigravida on their problem list.  ----------------------------------------------------------------------------------- Patient reports no complaints.   Contractions: Not present. Vag. Bleeding: None.  Movement: Absent. Denies leaking of fluid.  ----------------------------------------------------------------------------------- The following portions of the patient's history were reviewed and updated as appropriate: allergies, current medications, past family history, past medical history, past social history, past surgical history and problem list. Problem list updated.   Objective  Blood pressure 106/70, weight 149 lb (67.6 kg), last menstrual period 09/18/2018, unknown if currently breastfeeding. Pregravid weight 150 lb (68 kg) Total Weight Gain -1 lb (-0.454 kg) Urinalysis:      Fetal Status: Fetal Heart Rate (bpm): 135   Movement: Absent     General:  Alert, oriented and cooperative. Patient is in no acute distress.  Skin: Skin is warm and dry. No rash noted.   Cardiovascular: Normal heart rate noted  Respiratory: Normal respiratory effort, no problems with respiration noted  Abdomen: Soft, gravid, appropriate for gestational age. Pain/Pressure: Absent     Pelvic:  Cervical exam deferred        Extremities: Normal range of motion.     ental Status: Normal mood and affect. Normal behavior. Normal judgment and thought content.     Assessment   33 y.o. U4Q0347 at [redacted]w[redacted]d by  06/25/2019, by Last Menstrual Period presenting for routine prenatal visit  Plan   Pregnancy#4 Problems (from 09/18/18 to present)    Problem Noted Resolved   Supervision  of other normal pregnancy, antepartum 11/06/2018 by Malachy Mood, MD No   Advanced maternal age in multigravida 11/06/2018 by Malachy Mood, MD No   Overview Signed 11/06/2018 11:38 AM by Malachy Mood, MD    [facility]inic [facility] Prenatal [facility]  Dating  Blood type:     Genetic Screen 1 Screen:    AFP:     Quad:     NIPS: SMA:  FragileX:  CF: Antibody:   Anatomic [place]  Rubella:   Varicella: @VZVIGG @  GTT  RPR:     Rhogam  HBsAg:     TDaP vaccine                       Flu Shot: HIV:     Baby Food                                GBS:   Contraception  GBS prior pregnancy:  Pelvis Tested   Pap:  CS/VBAC    Support Person               Gestational age appropriate obstetric precautions including but not limited to vaginal bleeding, contractions, leaking of fluid and fetal movement were reviewed in detail with the patient.    Return in about 1 week (around 11/14/2018) for [place].  Malachy Mood, MD, [facility], [facility] 11/07/2018, 11:56 AM

## 2018-11-15 ENCOUNTER — Other Ambulatory Visit: Payer: Self-pay

## 2018-11-15 ENCOUNTER — Ambulatory Visit (INDEPENDENT_AMBULATORY_CARE_PROVIDER_SITE_OTHER): Payer: 59 | Admitting: Obstetrics and Gynecology

## 2018-11-15 VITALS — BP 109/68 | Wt 150.0 lb

## 2018-11-15 DIAGNOSIS — O09521 Supervision of elderly multigravida, first trimester: Secondary | ICD-10-CM

## 2018-11-15 DIAGNOSIS — Z3A08 8 weeks gestation of pregnancy: Secondary | ICD-10-CM

## 2018-11-15 DIAGNOSIS — Z3480 Encounter for supervision of other normal pregnancy, unspecified trimester: Secondary | ICD-10-CM

## 2018-11-15 DIAGNOSIS — O09529 Supervision of elderly multigravida, unspecified trimester: Secondary | ICD-10-CM

## 2018-11-15 NOTE — Progress Notes (Signed)
    Routine Prenatal Care Visit  Subjective  Brittany Good is a 33 y.o. G4P2002 at [redacted]w[redacted]d being seen today for ongoing prenatal care.  She is currently monitored for the following issues for this high-risk pregnancy and has Annual physical exam; Acne; Anemia; Supervision of other normal pregnancy, antepartum; and Advanced maternal age in multigravida on their problem list.  ----------------------------------------------------------------------------------- Patient reports no complaints.   Contractions: Not present. Vag. Bleeding: None.  Movement: Absent. Denies leaking of fluid.  ----------------------------------------------------------------------------------- The following portions of the patient's history were reviewed and updated as appropriate: allergies, current medications, past family history, past medical history, past social history, past surgical history and problem list. Problem list updated.   Objective  Blood pressure 109/68, weight 150 lb (68 kg), last menstrual period 09/18/2018, unknown if currently breastfeeding. Pregravid weight 150 lb (68 kg) Total Weight Gain 0 lb (0 kg) Urinalysis:      Fetal Status: Fetal Heart Rate (bpm): 171   Movement: Absent     General:  Alert, oriented and cooperative. Patient is in no acute distress.  Skin: Skin is warm and dry. No rash noted.   Cardiovascular: Normal heart rate noted  Respiratory: Normal respiratory effort, no problems with respiration noted  Abdomen: Soft, gravid, appropriate for gestational age. Pain/Pressure: Absent     Pelvic:  Cervical exam deferred        Extremities: Normal range of motion.     ental Status: Normal mood and affect. Normal behavior. Normal judgment and thought content.   CRL today [redacted]w[redacted]d FHT 171  Assessment   33 y.o. S9G2836 at [redacted]w[redacted]d by  06/25/2019, by Last Menstrual Period presenting for routine prenatal visit  Plan   Pregnancy#4 Problems (from 09/18/18 to present)    Problem Noted Resolved    Supervision of other normal pregnancy, antepartum 11/06/2018 by Malachy Mood, MD No   Advanced maternal age in multigravida 11/06/2018 by Malachy Mood, MD No   Overview Signed 11/06/2018 11:38 AM by Malachy Mood, MD    [facility]inic [facility] Prenatal Labs  Dating  Blood type:     Genetic Screen 1 Screen:    AFP:     Quad:     NIPS: SMA:  FragileX:  CF: Antibody:   Anatomic [place]  Rubella:   Varicella: @VZVIGG @  GTT  RPR:     Rhogam  HBsAg:     TDaP vaccine                       Flu Shot: HIV:     Baby Food                                GBS:   Contraception  GBS prior pregnancy:  Pelvis Tested   Pap:  CS/VBAC    Support Person               Gestational age appropriate obstetric precautions including but not limited to vaginal bleeding, contractions, leaking of fluid and fetal movement were reviewed in detail with the patient.    Return in about 1 week (around 11/22/2018) for [place].  Malachy Mood, MD, [facility], [facility] 11/15/2018, 8:51 AM

## 2018-11-15 NOTE — Progress Notes (Signed)
ROB

## 2018-11-24 ENCOUNTER — Ambulatory Visit (INDEPENDENT_AMBULATORY_CARE_PROVIDER_SITE_OTHER): Payer: 59 | Admitting: Obstetrics and Gynecology

## 2018-11-24 ENCOUNTER — Other Ambulatory Visit: Payer: Self-pay

## 2018-11-24 VITALS — BP 106/60 | Wt 152.0 lb

## 2018-11-24 DIAGNOSIS — Z3480 Encounter for supervision of other normal pregnancy, unspecified trimester: Secondary | ICD-10-CM

## 2018-11-24 DIAGNOSIS — Z3A09 9 weeks gestation of pregnancy: Secondary | ICD-10-CM

## 2018-11-24 DIAGNOSIS — O09521 Supervision of elderly multigravida, first trimester: Secondary | ICD-10-CM

## 2018-11-24 DIAGNOSIS — O09529 Supervision of elderly multigravida, unspecified trimester: Secondary | ICD-10-CM

## 2018-11-24 LAB — POCT URINALYSIS DIPSTICK OB
Glucose, UA: NEGATIVE
POC,PROTEIN,UA: NEGATIVE

## 2018-11-24 NOTE — Progress Notes (Signed)
Routine Prenatal Care Visit  Subjective  Brittany Good is a 33 y.o. O8C1660 at [redacted]w[redacted]d being seen today for ongoing prenatal care.  She is currently monitored for the following issues for this low-risk pregnancy and has Annual physical exam; Acne; Anemia; Supervision of other normal pregnancy, antepartum; and Advanced maternal age in multigravida on their problem list.  ----------------------------------------------------------------------------------- Patient reports no complaints.   Contractions: Not present. Vag. Bleeding: None.  Movement: Absent. Denies leaking of fluid.  ----------------------------------------------------------------------------------- The following portions of the patient's history were reviewed and updated as appropriate: allergies, current medications, past family history, past medical history, past social history, past surgical history and problem list. Problem list updated.   Objective  Blood pressure 106/60, weight 152 lb (68.9 kg), last menstrual period 09/18/2018, unknown if currently breastfeeding. Pregravid weight 150 lb (68 kg) Total Weight Gain 2 lb (0.907 kg) Urinalysis:      Fetal Status: Fetal Heart Rate (bpm): 176   Movement: Absent     General:  Alert, oriented and cooperative. Patient is in no acute distress.  Skin: Skin is warm and dry. No rash noted.   Cardiovascular: Normal heart rate noted  Respiratory: Normal respiratory effort, no problems with respiration noted  Abdomen: Soft, gravid, appropriate for gestational age. Pain/Pressure: Absent     Pelvic:  Cervical exam deferred        Extremities: Normal range of motion.     ental Status: Normal mood and affect. Normal behavior. Normal judgment and thought content.     Assessment   33 y.o. Y3K1601 at [redacted]w[redacted]d by  06/25/2019, by Last Menstrual Period presenting for routine prenatal visit  Plan   Pregnancy#4 Problems (from 09/18/18 to present)    Problem Noted Resolved   Supervision of  other normal pregnancy, antepartum 11/06/2018 by Malachy Mood, MD No   Overview Signed 11/15/2018  8:54 AM by Malachy Mood, MD    [facility] Prenatal Labs  Dating LMP = 6 week [place] Blood type:     Genetic Screen 1 Screen:    AFP:     Quad:     NIPS: Antibody:   Anatomic [place]  Rubella:   Varicella: @VZVIGG @  GTT Early:               Third trimester:  RPR:     Rhogam  HBsAg:     TDaP vaccine                       Flu Shot: HIV:     Baby Food                                GBS:   Contraception  Pap:  CBB     CS/VBAC    Support Person            Advanced maternal age in multigravida 11/06/2018 by Malachy Mood, MD No   Overview Signed 11/06/2018 11:38 AM by Malachy Mood, MD    [facility] Prenatal Labs  Dating  Blood type:     Genetic Screen 1 Screen:    AFP:     Quad:     NIPS: SMA:  FragileX:  CF: Antibody:   Anatomic [place]  Rubella:   Varicella: @VZVIGG @  GTT  RPR:     Rhogam  HBsAg:     TDaP vaccine  Flu Shot: HIV:     Baby Food                                GBS:   Contraception  GBS prior pregnancy:  Pelvis Tested   Pap:  CS/VBAC    Support Person               Gestational age appropriate obstetric precautions including but not limited to vaginal bleeding, contractions, leaking of fluid and fetal movement were reviewed in detail with the patient.    - NOB labs and MaterniT21 next visit  Return in about 1 week (around 12/01/2018) for ROB [name].  Brittany AustriaAndreas Doyne Micke, MD, Evern CoreFACOG [facility], [facility] 11/24/2018, 10:09 PM

## 2018-11-24 NOTE — Progress Notes (Signed)
ROB

## 2018-11-27 ENCOUNTER — Encounter: Payer: 59 | Admitting: Obstetrics and Gynecology

## 2018-11-29 ENCOUNTER — Other Ambulatory Visit: Payer: Self-pay | Admitting: Obstetrics and Gynecology

## 2018-11-29 ENCOUNTER — Other Ambulatory Visit: Payer: Self-pay

## 2018-11-29 ENCOUNTER — Ambulatory Visit (INDEPENDENT_AMBULATORY_CARE_PROVIDER_SITE_OTHER): Payer: 59 | Admitting: Obstetrics and Gynecology

## 2018-11-29 VITALS — BP 112/56 | Wt 152.0 lb

## 2018-11-29 DIAGNOSIS — Z3480 Encounter for supervision of other normal pregnancy, unspecified trimester: Secondary | ICD-10-CM | POA: Diagnosis not present

## 2018-11-29 DIAGNOSIS — Z31438 Encounter for other genetic testing of female for procreative management: Secondary | ICD-10-CM | POA: Diagnosis not present

## 2018-11-29 DIAGNOSIS — Z1379 Encounter for other screening for genetic and chromosomal anomalies: Secondary | ICD-10-CM

## 2018-11-29 DIAGNOSIS — O09529 Supervision of elderly multigravida, unspecified trimester: Secondary | ICD-10-CM | POA: Diagnosis not present

## 2018-11-29 DIAGNOSIS — O09521 Supervision of elderly multigravida, first trimester: Secondary | ICD-10-CM

## 2018-11-29 DIAGNOSIS — Z3A10 10 weeks gestation of pregnancy: Secondary | ICD-10-CM | POA: Diagnosis not present

## 2018-11-29 LAB — POCT URINALYSIS DIPSTICK OB
Glucose, UA: NEGATIVE
POC,PROTEIN,UA: NEGATIVE

## 2018-11-29 NOTE — Progress Notes (Signed)
ROB

## 2018-11-29 NOTE — Progress Notes (Signed)
Routine Prenatal Care Visit  Subjective  Brittany Good is a 33 y.o. G4P2002 at [redacted]w[redacted]d being seen today for ongoing prenatal care.  She is currently monitored for the following issues for this high-risk pregnancy and has Annual physical exam; Acne; Anemia; Supervision of other normal pregnancy, antepartum; and Advanced maternal age in multigravida on their problem list.  ----------------------------------------------------------------------------------- Patient reports constipation.   Contractions: Not present. Vag. Bleeding: None.  Movement: Absent. Denies leaking of fluid.  ----------------------------------------------------------------------------------- The following portions of the patient's history were reviewed and updated as appropriate: allergies, current medications, past family history, past medical history, past social history, past surgical history and problem list. Problem list updated.   Objective  Blood pressure (!) 112/56, weight 152 lb (68.9 kg), last menstrual period 09/18/2018, unknown if currently breastfeeding. Pregravid weight 150 lb (68 kg) Total Weight Gain 2 lb (0.907 kg) Urinalysis:      Fetal Status: Fetal Heart Rate (bpm): 150   Movement: Absent     General:  Alert, oriented and cooperative. Patient is in no acute distress.  Skin: Skin is warm and dry. No rash noted.   Cardiovascular: Normal heart rate noted  Respiratory: Normal respiratory effort, no problems with respiration noted  Abdomen: Soft, gravid, appropriate for gestational age. Pain/Pressure: Absent     Pelvic:  Cervical exam deferred        Extremities: Normal range of motion.     ental Status: Normal mood and affect. Normal behavior. Normal judgment and thought content.     Assessment   33 y.o. G2X5284 at [redacted]w[redacted]d by  06/25/2019, by Last Menstrual Period presenting for routine prenatal visit  Plan   Pregnancy#4 Problems (from 09/18/18 to present)    Problem Noted Resolved   Supervision of other normal pregnancy, antepartum 11/06/2018 by Malachy Mood, MD No   Overview Signed 11/15/2018  8:54 AM by Malachy Mood, MD    [facility] Prenatal Labs  Dating LMP = 6 week [place] Blood type:     Genetic Screen 1 Screen:    AFP:     Quad:     NIPS: Antibody:   Anatomic [place]  Rubella:   Varicella: @VZVIGG @  GTT  RPR:     Rhogam  HBsAg:     TDaP vaccine                       Flu Shot: HIV:     Baby Food                                GBS:   Contraception  Pap: 11/02/2018 NIL  CBB     CS/VBAC    Support Person Thayer Jew           Advanced maternal age in multigravida 11/06/2018 by Malachy Mood, MD No   Overview Signed 11/06/2018 11:38 AM by Malachy Mood, MD    Gestational age appropriate obstetric precautions including but not limited to vaginal bleeding, contractions, leaking of fluid and fetal movement were reviewed in detail with the patient.    1) Routine prenatal care - NOB labs, MaterniT21, and inheritest today  2) Constipation  - discussed colace (already taking), caffeine intake, fiber sources natural and supplements, as well as osmotic laxative like Mira lax.   Return in about 1 week (around 12/06/2018) for [place].  Malachy Mood, MD, [facility], [facility] 11/29/2018, 9:15 AM

## 2018-11-30 ENCOUNTER — Telehealth: Payer: Self-pay

## 2018-11-30 NOTE — Telephone Encounter (Signed)
Spoke w/Tina. Advised that patient was seen in [place] and drawn at the [facility] in [place]. She will need to contact them to verify what/# of specimens/kits were sent.

## 2018-11-30 NOTE — Telephone Encounter (Signed)
Brittany Good w/[facility] calling to verify the # of specimens sent (maternal kits) in for this patients. She has 2 and wants to be sure both kits belong to this patient. Cb#[id number] [phone].

## 2018-12-07 ENCOUNTER — Encounter: Payer: 59 | Admitting: Obstetrics and Gynecology

## 2018-12-09 LAB — RPR+RH+ABO+RUB AB+AB SCR+CB...
Antibody Screen: NEGATIVE
HIV Screen 4th Generation wRfx: NONREACTIVE
Hematocrit: 34.5 % (ref 34.0–46.6)
Hemoglobin: 11.8 g/dL (ref 11.1–15.9)
Hepatitis B Surface Ag: NEGATIVE
MCH: 31.4 pg (ref 26.6–33.0)
MCHC: 34.2 g/dL (ref 31.5–35.7)
MCV: 92 fL (ref 79–97)
Platelets: 242 10*3/uL (ref 150–450)
RBC: 3.76 x10E6/uL — ABNORMAL LOW (ref 3.77–5.28)
RDW: 12.3 % (ref 11.7–15.4)
RPR Ser Ql: NONREACTIVE
Rh Factor: POSITIVE
Rubella Antibodies, IGG: 7.86 index (ref >0.99)
Varicella zoster IgG: >4000 index (ref >165)
WBC: 9.2 10*3/uL (ref 3.4–10.8)

## 2018-12-09 LAB — MATERNIT 21 PLUS CORE, BLOOD
Fetal Fraction: 11
Result (T21): NEGATIVE
Trisomy 13 (Patau syndrome): NEGATIVE
Trisomy 18 (Edwards syndrome): NEGATIVE
Trisomy 21 (Down syndrome): NEGATIVE

## 2018-12-09 LAB — INHERITEST CORE(CF97,SMA,FRAX)

## 2018-12-13 ENCOUNTER — Ambulatory Visit (INDEPENDENT_AMBULATORY_CARE_PROVIDER_SITE_OTHER): Payer: 59 | Admitting: Obstetrics and Gynecology

## 2018-12-13 ENCOUNTER — Other Ambulatory Visit: Payer: Self-pay

## 2018-12-13 VITALS — BP 108/65 | Wt 154.0 lb

## 2018-12-13 DIAGNOSIS — Z3A12 12 weeks gestation of pregnancy: Secondary | ICD-10-CM

## 2018-12-13 DIAGNOSIS — Z3480 Encounter for supervision of other normal pregnancy, unspecified trimester: Secondary | ICD-10-CM

## 2018-12-13 DIAGNOSIS — Z3481 Encounter for supervision of other normal pregnancy, first trimester: Secondary | ICD-10-CM

## 2018-12-13 LAB — POCT URINALYSIS DIPSTICK OB
Glucose, UA: NEGATIVE
POC,PROTEIN,UA: NEGATIVE

## 2018-12-13 NOTE — Progress Notes (Signed)
    Routine Prenatal Care Visit  Subjective  Brittany Good is a 33 y.o. G4P2002 at [redacted]w[redacted]d being seen today for ongoing prenatal care.  She is currently monitored for the following issues for this high-risk pregnancy and has Annual physical exam; Acne; Anemia; Supervision of other normal pregnancy, antepartum; and Advanced maternal age in multigravida on their problem list.  ----------------------------------------------------------------------------------- Patient reports no complaints.  Some soreness left calf.  No redness, no swelling.   Contractions: Not present. Vag. Bleeding: None.  Movement: Absent. Denies leaking of fluid.  ----------------------------------------------------------------------------------- The following portions of the patient's history were reviewed and updated as appropriate: allergies, current medications, past family history, past medical history, past social history, past surgical history and problem list. Problem list updated.   Objective  Blood pressure 108/65, weight 154 lb (69.9 kg), last menstrual period 09/18/2018, unknown if currently breastfeeding. Pregravid weight 150 lb (68 kg) Total Weight Gain 4 lb (1.814 kg) Urinalysis:      Fetal Status: Fetal Heart Rate (bpm): 161   Movement: Absent     General:  Alert, oriented and cooperative. Patient is in no acute distress.  Skin: Skin is warm and dry. No rash noted.   Cardiovascular: Normal heart rate noted  Respiratory: Normal respiratory effort, no problems with respiration noted  Abdomen: Soft, gravid, appropriate for gestational age. Pain/Pressure: Absent     Pelvic:  Cervical exam deferred        Extremities: Normal range of motion.     ental Status: Normal mood and affect. Normal behavior. Normal judgment and thought content.    There is no immunization history on file for this patient.   Assessment   33 y.o. [id number] at [redacted]w[redacted]d by  06/25/2019, by Last Menstrual Period presenting for routine  prenatal visit  Plan   Pregnancy#4 Problems (from 09/18/18 to present)    Problem Noted Resolved   Supervision of other normal pregnancy, antepartum 11/06/2018 by Malachy Mood, MD No   Overview Addendum 11/29/2018  9:15 AM by Malachy Mood, MD    [facility]atal [facility]  Dating LMP = 6 week [place] Blood type:  B pos  Genetic Screen Inheritest neg    NIPS: XX Antibody: negative  Anatomic [place]  Rubella:  Immune Varicella: Immune  GTT  RPR: NR  Rhogam N/A HBsAg: negative  TDaP vaccine                       Flu Shot: HIV: Negative  Baby Food                                GBS:   Contraception  Pap: 11/02/18 NIL HPV negative  CBB     CS/VBAC N/A   Support Person Thayer Jew           Advanced maternal age in multigravida 11/06/2018 by Malachy Mood, MD No   Overview Addendum 11/29/2018  9:14 AM by Malachy Mood, MD               Gestational age appropriate obstetric precautions including but not limited to vaginal bleeding, contractions, leaking of fluid and fetal movement were reviewed in detail with the patient.    Return in about 1 week (around 12/20/2018) for [place].  Malachy Mood, MD, [facility], [facility] 12/13/2018, 11:19 AM

## 2018-12-13 NOTE — Progress Notes (Signed)
ROB

## 2018-12-21 ENCOUNTER — Other Ambulatory Visit: Payer: Self-pay

## 2018-12-21 ENCOUNTER — Ambulatory Visit (INDEPENDENT_AMBULATORY_CARE_PROVIDER_SITE_OTHER): Payer: 59 | Admitting: Obstetrics and Gynecology

## 2018-12-21 VITALS — BP 100/65 | Wt 154.0 lb

## 2018-12-21 DIAGNOSIS — Z3A13 13 weeks gestation of pregnancy: Secondary | ICD-10-CM

## 2018-12-21 DIAGNOSIS — O09521 Supervision of elderly multigravida, first trimester: Secondary | ICD-10-CM

## 2018-12-21 LAB — POCT URINALYSIS DIPSTICK OB
Glucose, UA: NEGATIVE
POC,PROTEIN,UA: NEGATIVE

## 2018-12-21 NOTE — Progress Notes (Signed)
    Routine Prenatal Care Visit  Subjective  Brittany Good is a 33 y.o. K5G2563 at [redacted]w[redacted]d being seen today for ongoing prenatal care.  She is currently monitored for the following issues for this low-risk pregnancy and has Acne; Anemia; Supervision of other normal pregnancy, antepartum; and Advanced maternal age in multigravida on their problem list.  ----------------------------------------------------------------------------------- Patient reports no complaints.   Contractions: Not present. Vag. Bleeding: None.  Movement: Absent. Denies leaking of fluid.  ----------------------------------------------------------------------------------- The following portions of the patient's history were reviewed and updated as appropriate: allergies, current medications, past family history, past medical history, past social history, past surgical history and problem list. Problem list updated.   Objective  Blood pressure 100/65, weight 154 lb (69.9 kg), last menstrual period 09/18/2018, unknown if currently breastfeeding. Pregravid weight 150 lb (68 kg) Total Weight Gain 4 lb (1.814 kg) Urinalysis:      Fetal Status: Fetal Heart Rate (bpm): 135   Movement: Absent     General:  Alert, oriented and cooperative. Patient is in no acute distress.  Skin: Skin is warm and dry. No rash noted.   Cardiovascular: Normal heart rate noted  Respiratory: Normal respiratory effort, no problems with respiration noted  Abdomen: Soft, gravid, appropriate for gestational age. Pain/Pressure: Absent     Pelvic:  Cervical exam deferred        Extremities: Normal range of motion.     ental Status: Normal mood and affect. Normal behavior. Normal judgment and thought content.     Assessment   33 y.o. [id number] at [redacted]w[redacted]d by  06/25/2019, by Last Menstrual Period presenting for routine prenatal visit  Plan   Pregnancy#4 Problems (from 09/18/18 to present)    Problem Noted Resolved   Supervision of other normal  pregnancy, antepartum 11/06/2018 by Malachy Mood, MD No   Overview Addendum 12/13/2018 11:18 AM by Malachy Mood, MD    [facility]  Dating LMP = 6 week [place] Blood type:  B pos  Genetic Screen Inheritest neg    NIPS: XX Antibody: negative  Anatomic [place]  Rubella:  Immune Varicella: Immune  GTT  RPR: NR  Rhogam N/A HBsAg: negative  TDaP vaccine                       Flu Shot: HIV: Negative  Baby Food                                GBS:   Contraception  Pap: 11/02/18 NIL HPV negative  CBB     CS/VBAC N/A   Support Person Thayer Jew          Advanced maternal age in multigravida 11/06/2018 by Malachy Mood, MD No   Overview Addendum 11/29/2018  9:14 AM by Malachy Mood, MD               Gestational age appropriate obstetric precautions including but not limited to vaginal bleeding, contractions, leaking of fluid and fetal movement were reviewed in detail with the patient.    Return in about 1 week (around 12/28/2018) for [place].  Malachy Mood, MD, [facility], [facility] 12/21/2018, 3:19 PM

## 2018-12-21 NOTE — Progress Notes (Signed)
ROB

## 2018-12-27 ENCOUNTER — Ambulatory Visit (INDEPENDENT_AMBULATORY_CARE_PROVIDER_SITE_OTHER): Payer: 59 | Admitting: Obstetrics and Gynecology

## 2018-12-27 ENCOUNTER — Other Ambulatory Visit: Payer: Self-pay

## 2018-12-27 VITALS — BP 114/69 | Wt 156.0 lb

## 2018-12-27 DIAGNOSIS — O09522 Supervision of elderly multigravida, second trimester: Secondary | ICD-10-CM

## 2018-12-27 DIAGNOSIS — O09529 Supervision of elderly multigravida, unspecified trimester: Secondary | ICD-10-CM

## 2018-12-27 DIAGNOSIS — Z3A14 14 weeks gestation of pregnancy: Secondary | ICD-10-CM

## 2018-12-27 DIAGNOSIS — Z3480 Encounter for supervision of other normal pregnancy, unspecified trimester: Secondary | ICD-10-CM

## 2018-12-27 LAB — POCT URINALYSIS DIPSTICK OB
Glucose, UA: NEGATIVE
POC,PROTEIN,UA: NEGATIVE

## 2018-12-27 NOTE — Progress Notes (Signed)
ROB- no concerns 

## 2019-01-01 NOTE — Progress Notes (Signed)
    Routine Prenatal Care Visit  Subjective  Brittany Good is a 33 y.o. G4P2002 at [redacted]w[redacted]d being seen today for ongoing prenatal care.  She is currently monitored for the following issues for this low-risk pregnancy and has Acne; Anemia; Supervision of other normal pregnancy, antepartum; and Advanced maternal age in multigravida on their problem list.  ----------------------------------------------------------------------------------- Patient reports no complaints.   Contractions: Not present. Vag. Bleeding: None.  Movement: Absent. Denies leaking of fluid.  ----------------------------------------------------------------------------------- The following portions of the patient's history were reviewed and updated as appropriate: allergies, current medications, past family history, past medical history, past social history, past surgical history and problem list. Problem list updated.   Objective  Blood pressure 114/69, weight 156 lb (70.8 kg), last menstrual period 09/18/2018, unknown if currently breastfeeding. Pregravid weight 150 lb (68 kg) Total Weight Gain 6 lb (2.722 kg) Urinalysis:      Fetal Status: Fetal Heart Rate (bpm): 140   Movement: Absent     General:  Alert, oriented and cooperative. Patient is in no acute distress.  Skin: Skin is warm and dry. No rash noted.   Cardiovascular: Normal heart rate noted  Respiratory: Normal respiratory effort, no problems with respiration noted  Abdomen: Soft, gravid, appropriate for gestational age. Pain/Pressure: Absent     Pelvic:  Cervical exam deferred        Extremities: Normal range of motion.     ental Status: Normal mood and affect. Normal behavior. Normal judgment and thought content.     Assessment   33 y.o. [id number] at [redacted]w[redacted]d by  06/25/2019, by Last Menstrual Period presenting for routine prenatal visit  Plan   Pregnancy#4 Problems (from 09/18/18 to present)    Problem Noted Resolved   Supervision of other normal  pregnancy, antepartum 11/06/2018 by Malachy Mood, MD No   Overview Addendum 12/13/2018 11:18 AM by Malachy Mood, MD    [facility]  Dating LMP = 6 week [place] Blood type:  B pos  Genetic Screen Inheritest neg    NIPS: XX Antibody: negative  Anatomic [place]  Rubella:  Immune Varicella: Immune  GTT  RPR: NR  Rhogam N/A HBsAg: negative  TDaP vaccine                       Flu Shot: HIV: Negative  Baby Food                                GBS:   Contraception  Pap: 11/02/18 NIL HPV negative  CBB     CS/VBAC N/A   Support Person Thayer Jew          Advanced maternal age in multigravida 11/06/2018 by Malachy Mood, MD No   Overview Addendum 11/29/2018  9:14 AM by Malachy Mood, MD               Gestational age appropriate obstetric precautions including but not limited to vaginal bleeding, contractions, leaking of fluid and fetal movement were reviewed in detail with the patient.    Return in about 1 week (around 01/03/2019) for [place].  Malachy Mood, MD, [facility], [place]

## 2019-01-03 ENCOUNTER — Other Ambulatory Visit: Payer: Self-pay

## 2019-01-03 ENCOUNTER — Ambulatory Visit (INDEPENDENT_AMBULATORY_CARE_PROVIDER_SITE_OTHER): Payer: 59 | Admitting: Obstetrics and Gynecology

## 2019-01-03 VITALS — BP 106/68 | Wt 160.0 lb

## 2019-01-03 DIAGNOSIS — Z3A15 15 weeks gestation of pregnancy: Secondary | ICD-10-CM

## 2019-01-03 DIAGNOSIS — Z3480 Encounter for supervision of other normal pregnancy, unspecified trimester: Secondary | ICD-10-CM

## 2019-01-03 DIAGNOSIS — O09529 Supervision of elderly multigravida, unspecified trimester: Secondary | ICD-10-CM

## 2019-01-03 DIAGNOSIS — O09522 Supervision of elderly multigravida, second trimester: Secondary | ICD-10-CM

## 2019-01-03 LAB — POCT URINALYSIS DIPSTICK OB
Glucose, UA: NEGATIVE
POC,PROTEIN,UA: NEGATIVE

## 2019-01-03 NOTE — Progress Notes (Signed)
    Routine Prenatal Care Visit  Subjective  Brittany Good is a 33 y.o. K9T2671 at [redacted]w[redacted]d being seen today for ongoing prenatal care.  She is currently monitored for the following issues for this low-risk pregnancy and has Acne; Anemia; Supervision of other normal pregnancy, antepartum; and Advanced maternal age in multigravida on their problem list.  ----------------------------------------------------------------------------------- Patient reports no complaints.   Contractions: Not present. Vag. Bleeding: None.  Movement: Absent. Denies leaking of fluid.  ----------------------------------------------------------------------------------- The following portions of the patient's history were reviewed and updated as appropriate: allergies, current medications, past family history, past medical history, past social history, past surgical history and problem list. Problem list updated.   Objective  Blood pressure 106/68, weight 160 lb (72.6 kg), last menstrual period 09/18/2018, unknown if currently breastfeeding. Pregravid weight 150 lb (68 kg) Total Weight Gain 10 lb (4.536 kg) Urinalysis:      Fetal Status: Fetal Heart Rate (bpm): 153   Movement: Absent     General:  Alert, oriented and cooperative. Patient is in no acute distress.  Skin: Skin is warm and dry. No rash noted.   Cardiovascular: Normal heart rate noted  Respiratory: Normal respiratory effort, no problems with respiration noted  Abdomen: Soft, gravid, appropriate for gestational age. Pain/Pressure: Absent     Pelvic:  Cervical exam deferred        Extremities: Normal range of motion.     ental Status: Normal mood and affect. Normal behavior. Normal judgment and thought content.     Assessment   33 y.o. I4P8099 at [redacted]w[redacted]d by  06/25/2019, by Last Menstrual Period presenting for routine prenatal visit  Plan   Pregnancy#4 Problems (from 09/18/18 to present)    Problem Noted Resolved   Supervision of other normal  pregnancy, antepartum 11/06/2018 by Malachy Mood, MD No   Overview Addendum 12/13/2018 11:18 AM by Malachy Mood, MD    [facility]  Dating LMP = 6 week [place] Blood type:  B pos  Genetic Screen Inheritest neg    NIPS: XX Antibody: negative  Anatomic [place]  Rubella:  Immune Varicella: Immune  GTT  RPR: NR  Rhogam N/A HBsAg: negative  TDaP vaccine                       Flu Shot: HIV: Negative  Baby Food                                GBS:   Contraception  Pap: 11/02/18 NIL HPV negative  CBB     CS/VBAC N/A   Support Person Thayer Jew          Advanced maternal age in multigravida 11/06/2018 by Malachy Mood, MD No   Overview Addendum 11/29/2018  9:14 AM by Malachy Mood, MD               Gestational age appropriate obstetric precautions including but not limited to vaginal bleeding, contractions, leaking of fluid and fetal movement were reviewed in detail with the patient.    Return in about 1 week (around 01/10/2019) for [place].  Malachy Mood, MD, [facility], [facility] 01/03/2019, 11:02 AM

## 2019-01-03 NOTE — Progress Notes (Signed)
ROB

## 2019-01-15 ENCOUNTER — Ambulatory Visit (INDEPENDENT_AMBULATORY_CARE_PROVIDER_SITE_OTHER): Payer: 59 | Admitting: Obstetrics and Gynecology

## 2019-01-15 ENCOUNTER — Other Ambulatory Visit: Payer: Self-pay

## 2019-01-15 VITALS — BP 124/60 | Wt 161.0 lb

## 2019-01-15 DIAGNOSIS — O09529 Supervision of elderly multigravida, unspecified trimester: Secondary | ICD-10-CM

## 2019-01-15 DIAGNOSIS — O09522 Supervision of elderly multigravida, second trimester: Secondary | ICD-10-CM

## 2019-01-15 DIAGNOSIS — Z3480 Encounter for supervision of other normal pregnancy, unspecified trimester: Secondary | ICD-10-CM

## 2019-01-15 DIAGNOSIS — Z363 Encounter for antenatal screening for malformations: Secondary | ICD-10-CM

## 2019-01-15 DIAGNOSIS — Z3A17 17 weeks gestation of pregnancy: Secondary | ICD-10-CM

## 2019-01-15 NOTE — Progress Notes (Signed)
    Routine Prenatal Care Visit  Subjective  Brittany Good is a 33 y.o. G4P2002 at [redacted]w[redacted]d being seen today for ongoing prenatal care.  She is currently monitored for the following issues for this low-risk pregnancy and has Acne; Anemia; Supervision of other normal pregnancy, antepartum; and Advanced maternal age in multigravida on their problem list.  ----------------------------------------------------------------------------------- Patient reports no complaints.   Contractions: Not present. Vag. Bleeding: None.  Movement: Absent. Denies leaking of fluid.  ----------------------------------------------------------------------------------- The following portions of the patient's history were reviewed and updated as appropriate: allergies, current medications, past family history, past medical history, past social history, past surgical history and problem list. Problem list updated.   Objective  Blood pressure 124/60, weight 161 lb (73 kg), last menstrual period 09/18/2018, unknown if currently breastfeeding. Pregravid weight 150 lb (68 kg) Total Weight Gain 11 lb (4.99 kg) Urinalysis:      Fetal Status: Fetal Heart Rate (bpm): 145   Movement: Absent     General:  Alert, oriented and cooperative. Patient is in no acute distress.  Skin: Skin is warm and dry. No rash noted.   Cardiovascular: Normal heart rate noted  Respiratory: Normal respiratory effort, no problems with respiration noted  Abdomen: Soft, gravid, appropriate for gestational age. Pain/Pressure: Absent     Pelvic:  Cervical exam deferred        Extremities: Normal range of motion.     ental Status: Normal mood and affect. Normal behavior. Normal judgment and thought content.     Assessment   33 y.o. [id number] at [redacted]w[redacted]d by  06/25/2019, by Last Menstrual Period presenting for routine prenatal visit  Plan   Pregnancy#4 Problems (from 09/18/18 to present)    Problem Noted Resolved   Supervision of other normal  pregnancy, antepartum 11/06/2018 by Malachy Mood, MD No   Overview Addendum 12/13/2018 11:18 AM by Malachy Mood, MD    [facility]  Dating LMP = 6 week [place] Blood type:  B pos  Genetic Screen Inheritest neg    NIPS: XX Antibody: negative  Anatomic [place]  Rubella:  Immune Varicella: Immune  GTT  RPR: NR  Rhogam N/A HBsAg: negative  TDaP vaccine                       Flu Shot: HIV: Negative  Baby Food                                GBS:   Contraception  Pap: 11/02/18 NIL HPV negative  CBB     CS/VBAC N/A   Support Person Thayer Jew          Advanced maternal age in multigravida 11/06/2018 by Malachy Mood, MD No   Overview Addendum 11/29/2018  9:14 AM by Malachy Mood, MD               Gestational age appropriate obstetric precautions including but not limited to vaginal bleeding, contractions, leaking of fluid and fetal movement were reviewed in detail with the patient.    Return in about 1 week (around 01/22/2019) for ROB and anatomy scan (anatomy scan 1-3 weeks ok).  Malachy Mood, MD, [facility], [facility] 01/15/2019, 7:45 PM

## 2019-01-15 NOTE — Progress Notes (Signed)
ROB

## 2019-01-22 ENCOUNTER — Other Ambulatory Visit: Payer: Self-pay

## 2019-01-22 ENCOUNTER — Ambulatory Visit (INDEPENDENT_AMBULATORY_CARE_PROVIDER_SITE_OTHER): Payer: 59 | Admitting: Obstetrics and Gynecology

## 2019-01-22 VITALS — BP 120/64 | Wt 162.0 lb

## 2019-01-22 DIAGNOSIS — Z3A18 18 weeks gestation of pregnancy: Secondary | ICD-10-CM

## 2019-01-22 DIAGNOSIS — O09522 Supervision of elderly multigravida, second trimester: Secondary | ICD-10-CM

## 2019-01-22 DIAGNOSIS — O09529 Supervision of elderly multigravida, unspecified trimester: Secondary | ICD-10-CM

## 2019-01-22 DIAGNOSIS — Z3480 Encounter for supervision of other normal pregnancy, unspecified trimester: Secondary | ICD-10-CM

## 2019-01-22 LAB — POCT URINALYSIS DIPSTICK OB
Glucose, UA: NEGATIVE
POC,PROTEIN,UA: NEGATIVE

## 2019-01-22 NOTE — Progress Notes (Signed)
ROB

## 2019-01-25 NOTE — Progress Notes (Signed)
    Routine Prenatal Care Visit  Subjective  Brittany Good is a 33 y.o. G4P2002 at [redacted]w[redacted]d being seen today for ongoing prenatal care.  She is currently monitored for the following issues for this low-risk pregnancy and has Acne; Anemia; Supervision of other normal pregnancy, antepartum; and Advanced maternal age in multigravida on their problem list.  ----------------------------------------------------------------------------------- Patient reports no complaints.   Contractions: Not present. Vag. Bleeding: None.  Movement: Absent. Denies leaking of fluid.  ----------------------------------------------------------------------------------- The following portions of the patient's history were reviewed and updated as appropriate: allergies, current medications, past family history, past medical history, past social history, past surgical history and problem list. Problem list updated.   Objective  Blood pressure 120/64, weight 162 lb (73.5 kg), last menstrual period 09/18/2018, unknown if currently breastfeeding. Pregravid weight 150 lb (68 kg) Total Weight Gain 12 lb (5.443 kg) Urinalysis:      Fetal Status: Fetal Heart Rate (bpm): 150   Movement: Absent     General:  Alert, oriented and cooperative. Patient is in no acute distress.  Skin: Skin is warm and dry. No rash noted.   Cardiovascular: Normal heart rate noted  Respiratory: Normal respiratory effort, no problems with respiration noted  Abdomen: Soft, gravid, appropriate for gestational age. Pain/Pressure: Absent     Pelvic:  Cervical exam deferred        Extremities: Normal range of motion.     ental Status: Normal mood and affect. Normal behavior. Normal judgment and thought content.     Assessment   33 y.o. [id number] at [redacted]w[redacted]d by  06/25/2019, by Last Menstrual Period presenting for routine prenatal visit  Plan   Pregnancy#4 Problems (from 09/18/18 to present)    Problem Noted Resolved   Supervision of other normal  pregnancy, antepartum 11/06/2018 by Malachy Mood, MD No   Overview Addendum 12/13/2018 11:18 AM by Malachy Mood, MD    [facility]  Dating LMP = 6 week [place] Blood type:  B pos  Genetic Screen Inheritest neg    NIPS: XX Antibody: negative  Anatomic [place]  Rubella:  Immune Varicella: Immune  GTT  RPR: NR  Rhogam N/A HBsAg: negative  TDaP vaccine                       Flu Shot: HIV: Negative  Baby Food                                GBS:   Contraception  Pap: 11/02/18 NIL HPV negative  CBB     CS/VBAC N/A   Support Person Thayer Jew          Advanced maternal age in multigravida 11/06/2018 by Malachy Mood, MD No   Overview Addendum 11/29/2018  9:14 AM by Malachy Mood, MD               Gestational age appropriate obstetric precautions including but not limited to vaginal bleeding, contractions, leaking of fluid and fetal movement were reviewed in detail with the patient.    Return in about 1 week (around 01/29/2019) for rob, anatomy scan.  Malachy Mood, MD, [facility], [employer]

## 2019-01-30 ENCOUNTER — Ambulatory Visit (INDEPENDENT_AMBULATORY_CARE_PROVIDER_SITE_OTHER): Payer: 59

## 2019-01-30 ENCOUNTER — Other Ambulatory Visit: Payer: Self-pay

## 2019-01-30 ENCOUNTER — Ambulatory Visit (INDEPENDENT_AMBULATORY_CARE_PROVIDER_SITE_OTHER): Payer: 59 | Admitting: Obstetrics and Gynecology

## 2019-01-30 VITALS — BP 122/64 | Wt 163.0 lb

## 2019-01-30 DIAGNOSIS — O09522 Supervision of elderly multigravida, second trimester: Secondary | ICD-10-CM

## 2019-01-30 DIAGNOSIS — O09529 Supervision of elderly multigravida, unspecified trimester: Secondary | ICD-10-CM

## 2019-01-30 DIAGNOSIS — Z363 Encounter for antenatal screening for malformations: Secondary | ICD-10-CM | POA: Diagnosis not present

## 2019-01-30 DIAGNOSIS — Z3480 Encounter for supervision of other normal pregnancy, unspecified trimester: Secondary | ICD-10-CM

## 2019-01-30 DIAGNOSIS — Z3A19 19 weeks gestation of pregnancy: Secondary | ICD-10-CM

## 2019-01-30 LAB — POCT URINALYSIS DIPSTICK OB
Glucose, UA: NEGATIVE
POC,PROTEIN,UA: NEGATIVE

## 2019-01-30 NOTE — Progress Notes (Signed)
ROB  °Anatomy scan °

## 2019-02-02 NOTE — Progress Notes (Signed)
Routine Prenatal Care Visit  Subjective  Brittany Good is a 33 y.o. [id number] at [redacted]w[redacted]d being seen today for ongoing prenatal care.  She is currently monitored for the following issues for this low-risk pregnancy and has Acne; Anemia; Supervision of other normal pregnancy, antepartum; and Advanced maternal age in multigravida on their problem list.  ----------------------------------------------------------------------------------- Patient reports no complaints.   Contractions: Not present. Vag. Bleeding: None.  Movement: Absent. Denies leaking of fluid.  ----------------------------------------------------------------------------------- The following portions of the patient's history were reviewed and updated as appropriate: allergies, current medications, past family history, past medical history, past social history, past surgical history and problem list. Problem list updated.   Objective  Blood pressure 122/64, weight 163 lb (73.9 kg), last menstrual period 09/18/2018, unknown if currently breastfeeding. Pregravid weight 150 lb (68 kg) Total Weight Gain 13 lb (5.897 kg) Urinalysis:      Fetal Status: Fetal Heart Rate (bpm): 140   Movement: Absent     General:  Alert, oriented and cooperative. Patient is in no acute distress.  Skin: Skin is warm and dry. No rash noted.   Cardiovascular: Normal heart rate noted  Respiratory: Normal respiratory effort, no problems with respiration noted  Abdomen: Soft, gravid, appropriate for gestational age. Pain/Pressure: Absent     Pelvic:  Cervical exam deferred        Extremities: Normal range of motion.     ental Status: Normal Good and affect. Normal behavior. Normal judgment and thought content.   US Ob Comp + 14 Wk  Result Date: 02/02/2019 Patient Name: Brittany Good DOB: 1985/12/23 MRN: [id number] ULTRASOUND REPORT Location: [facility] Date of Service: 01/30/2019 Indications:Anatomy Ultrasound Findings: Brittany Good intrauterine  pregnancy is visualized with FHR at 158 BPM. Biometrics give an (U/S) Gestational age of [redacted]w[redacted]d and an (U/S) EDD of 06/25/2019; this correlates with the clinically established Estimated Date of Delivery: 06/25/19 Fetal presentation is Cephalic. EFW: 276 g ( 10 oz ). Placenta: anterior. Grade: 0 AFI: subjectively normal. Anatomic survey is complete and normal; Gender - female.  Impression: 1. [redacted]w[redacted]d Viable Singleton Intrauterine pregnancy by U/S. 2. (U/S) EDD is consistent with Clinically established Estimated Date of Delivery: 06/25/19 . 3. Normal Anatomy Scan Recommendations: 1.Clinical correlation with the patient's History and Physical Exam. Brittany Good, RT  There is a singleton gestation with subjectively normal amniotic fluid volume. The fetal biometry correlates with established dating. Detailed evaluation of the fetal anatomy was performed.The fetal anatomical survey appears within normal limits within the resolution of ultrasound as described above.  It must be noted that a normal ultrasound is unable to rule out fetal aneuploidy, subtle defects such as small ASD or VDS may also not be visible on imaging.  Brittany Mood, MD, [facility], [facility]     Assessment   33 y.o. [id number] at [redacted]w[redacted]d by  06/25/2019, by Last Menstrual Period presenting for routine prenatal visit  Plan   Pregnancy#4 Problems (from 09/18/18 to present)    Problem Noted Resolved   Supervision of other normal pregnancy, antepartum 11/06/2018 by Brittany Mood, MD No   Overview Addendum 12/13/2018 11:18 AM by Brittany Mood, MD    Clinic [facility] Prenatal Labs  Dating LMP = 6 week [place] Blood type:  B pos  Genetic Screen Inheritest neg    NIPS: XX Antibody: negative  Anatomic [place]  Rubella:  Immune Varicella: Immune  GTT  RPR: NR  Rhogam N/A HBsAg: negative  TDaP vaccine  Flu Shot: HIV: Negative  Baby Food                                GBS:   Contraception  Pap: 11/02/18  NIL HPV negative  CBB     CS/VBAC N/A   Support Person [name]          Advanced maternal age in multigravida 11/06/2018 by Vena Austria, MD No   Overview Addendum 11/29/2018  9:14 AM by Vena Austria, MD               Gestational age appropriate obstetric precautions including but not limited to vaginal bleeding, contractions, leaking of fluid and fetal movement were reviewed in detail with the patient.    - normal anatomy scan.  Still unable to feel movement in setting of anterior palcenta.  Otherwise doing well  Return in about 2 weeks (around 02/13/2019) for ROB Krystol Rocco.  Vena Austria, MD, [facility], [facility]

## 2019-02-15 ENCOUNTER — Other Ambulatory Visit: Payer: Self-pay

## 2019-02-15 ENCOUNTER — Ambulatory Visit (INDEPENDENT_AMBULATORY_CARE_PROVIDER_SITE_OTHER): Payer: 59 | Admitting: Obstetrics and Gynecology

## 2019-02-15 VITALS — BP 118/74 | Wt 169.0 lb

## 2019-02-15 DIAGNOSIS — Z3480 Encounter for supervision of other normal pregnancy, unspecified trimester: Secondary | ICD-10-CM

## 2019-02-15 DIAGNOSIS — O09522 Supervision of elderly multigravida, second trimester: Secondary | ICD-10-CM

## 2019-02-15 DIAGNOSIS — O09529 Supervision of elderly multigravida, unspecified trimester: Secondary | ICD-10-CM

## 2019-02-15 DIAGNOSIS — Z3A21 21 weeks gestation of pregnancy: Secondary | ICD-10-CM

## 2019-02-15 NOTE — Progress Notes (Signed)
No vb. No lof.  

## 2019-02-15 NOTE — Progress Notes (Signed)
    Routine Prenatal Care Visit  Subjective  Brittany Good is a 33 y.o. [id number] at [redacted]w[redacted]d being seen today for ongoing prenatal care.  She is currently monitored for the following issues for this low-risk pregnancy and has Acne; Anemia; Supervision of other normal pregnancy, antepartum; and Advanced maternal age in multigravida on their problem list.  ----------------------------------------------------------------------------------- Patient reports no complaints.   Contractions: Not present. Vag. Bleeding: None.  Movement: Present. Denies leaking of fluid.  ----------------------------------------------------------------------------------- The following portions of the patient's history were reviewed and updated as appropriate: allergies, current medications, past family history, past medical history, past social history, past surgical history and problem list. Problem list updated.   Objective  Blood pressure 118/74, weight 169 lb (76.7 kg), last menstrual period 09/18/2018, unknown if currently breastfeeding. Pregravid weight 150 lb (68 kg) Total Weight Gain 19 lb (8.618 kg) Urinalysis:      Fetal Status:     Movement: Present   Bedside US good fetal movement, subjectively normal fluid.  Decreased fetal movement earlier this week has since picked back up  General:  Alert, oriented and cooperative. Patient is in no acute distress.  Skin: Skin is warm and dry. No rash noted.   Cardiovascular: Normal heart rate noted  Respiratory: Normal respiratory effort, no problems with respiration noted  Abdomen: Soft, gravid, appropriate for gestational age. Pain/Pressure: Absent     Pelvic:  Cervical exam deferred        Extremities: Normal range of motion.     ental Status: Normal mood and affect. Normal behavior. Normal judgment and thought content.     Assessment   33 y.o. [id number] at [redacted]w[redacted]d by  06/25/2019, by Last Menstrual Period presenting for routine prenatal visit  Plan    Pregnancy#4 Problems (from 09/18/18 to present)    Problem Noted Resolved   Supervision of other normal pregnancy, antepartum 11/06/2018 by Malachy Mood, MD No   Overview Addendum 02/02/2019 11:07 AM by Malachy Mood, MD    [facility]inic [facility] Prenatal Labs  Dating LMP = 6 week [place] Blood type:  B pos  Genetic Screen Inheritest neg    NIPS: XX Antibody: negative  Anatomic [place] Normal Rubella:  Immune Varicella: Immune  GTT  RPR: NR  Rhogam N/A HBsAg: negative  TDaP vaccine                       Flu Shot: HIV: Negative  Baby Food                                GBS:   Contraception  Pap: 11/02/18 NIL HPV negative  CBB     CS/VBAC N/A   Support Person Thayer Jew          Advanced maternal age in multigravida 11/06/2018 by Malachy Mood, MD No   Overview Addendum 11/29/2018  9:14 AM by Malachy Mood, MD               Gestational age appropriate obstetric precautions including but not limited to vaginal bleeding, contractions, leaking of fluid and fetal movement were reviewed in detail with the patient.    Return in about 4 weeks (around 03/15/2019) for ROB.  Malachy Mood, MD, [facility], [facility] 02/15/2019, 3:31 PM

## 2019-02-20 DIAGNOSIS — H524 Presbyopia: Secondary | ICD-10-CM | POA: Diagnosis not present

## 2019-02-20 DIAGNOSIS — H52223 Regular astigmatism, bilateral: Secondary | ICD-10-CM | POA: Diagnosis not present

## 2019-02-20 DIAGNOSIS — H5213 Myopia, bilateral: Secondary | ICD-10-CM | POA: Diagnosis not present

## 2019-03-13 ENCOUNTER — Ambulatory Visit (INDEPENDENT_AMBULATORY_CARE_PROVIDER_SITE_OTHER): Payer: 59 | Admitting: Obstetrics and Gynecology

## 2019-03-13 ENCOUNTER — Other Ambulatory Visit: Payer: Self-pay

## 2019-03-13 VITALS — BP 104/60 | Wt 173.0 lb

## 2019-03-13 DIAGNOSIS — Z3A25 25 weeks gestation of pregnancy: Secondary | ICD-10-CM

## 2019-03-13 DIAGNOSIS — O09522 Supervision of elderly multigravida, second trimester: Secondary | ICD-10-CM

## 2019-03-13 DIAGNOSIS — Z3480 Encounter for supervision of other normal pregnancy, unspecified trimester: Secondary | ICD-10-CM

## 2019-03-13 DIAGNOSIS — O09529 Supervision of elderly multigravida, unspecified trimester: Secondary | ICD-10-CM

## 2019-03-13 NOTE — Progress Notes (Signed)
    Routine Prenatal Care Visit  Subjective  Brittany Good is a 33 y.o. G4P2002 at [redacted]w[redacted]d being seen today for ongoing prenatal care.  She is currently monitored for the following issues for this low-risk pregnancy and has Acne; Anemia; Supervision of other normal pregnancy, antepartum; and Advanced maternal age in multigravida on their problem list.  ----------------------------------------------------------------------------------- Patient reports no complaints.   Contractions: Not present. Vag. Bleeding: None.  Movement: Present. Denies leaking of fluid.  ----------------------------------------------------------------------------------- The following portions of the patient's history were reviewed and updated as appropriate: allergies, current medications, past family history, past medical history, past social history, past surgical history and problem list. Problem list updated.   Objective  Blood pressure 104/60, weight 173 lb (78.5 kg), last menstrual period 09/18/2018, unknown if currently breastfeeding. Pregravid weight 150 lb (68 kg) Total Weight Gain 23 lb (10.4 kg) Urinalysis:      Fetal Status: Fetal Heart Rate (bpm): 140   Movement: Present     General:  Alert, oriented and cooperative. Patient is in no acute distress.  Skin: Skin is warm and dry. No rash noted.   Cardiovascular: Normal heart rate noted  Respiratory: Normal respiratory effort, no problems with respiration noted  Abdomen: Soft, gravid, appropriate for gestational age. Pain/Pressure: Absent     Pelvic:  Cervical exam deferred        Extremities: Normal range of motion.     ental Status: Normal mood and affect. Normal behavior. Normal judgment and thought content.     Assessment   33 y.o. [id number] at [redacted]w[redacted]d by  06/25/2019, by Last Menstrual Period presenting for routine prenatal visit  Plan   Pregnancy#4 Problems (from 09/18/18 to present)    Problem Noted Resolved   Supervision of other normal  pregnancy, antepartum 11/06/2018 by Malachy Mood, MD No   Overview Addendum 02/02/2019 11:07 AM by Malachy Mood, MD    [facility]inic [facility] Prenatal Labs  Dating LMP = 6 week [place] Blood type:  B pos  Genetic Screen Inheritest neg    NIPS: XX Antibody: negative  Anatomic [place] Normal Rubella:  Immune Varicella: Immune  GTT  RPR: NR  Rhogam N/A HBsAg: negative  TDaP vaccine                       Flu Shot: HIV: Negative  Baby Food                                GBS:   Contraception  Pap: 11/02/18 NIL HPV negative  CBB     CS/VBAC N/A   Support Person Thayer Jew          Advanced maternal age in multigravida 11/06/2018 by Malachy Mood, MD No   Overview Addendum 11/29/2018  9:14 AM by Malachy Mood, MD               Gestational age appropriate obstetric precautions including but not limited to vaginal bleeding, contractions, leaking of fluid and fetal movement were reviewed in detail with the patient.    Return in about 3 weeks (around 04/03/2019) for ROB and 28 week labs.  Malachy Mood, MD, [facility], [facility] 03/13/2019, 4:16 PM

## 2019-03-13 NOTE — Progress Notes (Signed)
ROB

## 2019-03-16 NOTE — L&D Delivery Note (Signed)
Delivery Note Primary OB: [facility] Delivery Physician: Brittany Major, MD Gestational Age: Full term Antepartum complications: none Intrapartum complications: None  A viable Female was delivered via face presentation chin anterior, without diffculty or apparent trauma, with 2 pushes. "Brittany Good" Apgars:8 ,9  Weight:  pending .   Placenta status: spontaneous and Intact.  Cord: 3+ vessels;  with the following complications: none.  Anesthesia:  none Episiotomy:  none Lacerations:  1st Suture Repair: 2.0 vicryl Est. Blood Loss (mL):  less than 100 mL  Mom to postpartum.  Baby to Couplet care / Skin to Skin.  Brittany Major, MD, [facility], [facility] 06/23/2019  2:24 AM [phone]

## 2019-04-06 ENCOUNTER — Ambulatory Visit (INDEPENDENT_AMBULATORY_CARE_PROVIDER_SITE_OTHER): Payer: 59 | Admitting: Obstetrics and Gynecology

## 2019-04-06 ENCOUNTER — Other Ambulatory Visit: Payer: Self-pay

## 2019-04-06 ENCOUNTER — Other Ambulatory Visit: Payer: 59

## 2019-04-06 VITALS — BP 118/66 | Wt 180.0 lb

## 2019-04-06 DIAGNOSIS — Z3A28 28 weeks gestation of pregnancy: Secondary | ICD-10-CM

## 2019-04-06 DIAGNOSIS — O09529 Supervision of elderly multigravida, unspecified trimester: Secondary | ICD-10-CM | POA: Diagnosis not present

## 2019-04-06 DIAGNOSIS — Z3480 Encounter for supervision of other normal pregnancy, unspecified trimester: Secondary | ICD-10-CM

## 2019-04-06 DIAGNOSIS — O09523 Supervision of elderly multigravida, third trimester: Secondary | ICD-10-CM

## 2019-04-06 LAB — POCT URINALYSIS DIPSTICK OB
Glucose, UA: NEGATIVE
POC,PROTEIN,UA: NEGATIVE

## 2019-04-06 NOTE — Addendum Note (Signed)
Addended by: Lorrene Reid on: 04/06/2019 01:33 PM   Modules accepted: Orders

## 2019-04-06 NOTE — Progress Notes (Signed)
ROB 28 week labs 

## 2019-04-06 NOTE — Progress Notes (Signed)
    Routine Prenatal Care Visit  Subjective  Brittany Good is a 34 y.o. G4P2002 at [redacted]w[redacted]d being seen today for ongoing prenatal care.  She is currently monitored for the following issues for this low-risk pregnancy and has Acne; Anemia; Supervision of other normal pregnancy, antepartum; and Advanced maternal age in multigravida on their problem list.  ----------------------------------------------------------------------------------- Patient reports no complaints.   Contractions: Not present. Vag. Bleeding: None.  Movement: Present. Denies leaking of fluid.  ----------------------------------------------------------------------------------- The following portions of the patient's history were reviewed and updated as appropriate: allergies, current medications, past family history, past medical history, past social history, past surgical history and problem list. Problem list updated.   Objective  Blood pressure 118/66, weight 180 lb (81.6 kg), last menstrual period 09/18/2018, unknown if currently breastfeeding. Pregravid weight 150 lb (68 kg) Total Weight Gain 30 lb (13.6 kg) Urinalysis:      Fetal Status: Fetal Heart Rate (bpm): 150 Fundal Height: 27 cm Movement: Present  Presentation: Vertex  General:  Alert, oriented and cooperative. Patient is in no acute distress.  Skin: Skin is warm and dry. No rash noted.   Cardiovascular: Normal heart rate noted  Respiratory: Normal respiratory effort, no problems with respiration noted  Abdomen: Soft, gravid, appropriate for gestational age. Pain/Pressure: Absent     Pelvic:  Cervical exam deferred        Extremities: Normal range of motion.     ental Status: Normal mood and affect. Normal behavior. Normal judgment and thought content.     Assessment   34 y.o. [id number] at [redacted]w[redacted]d by  06/25/2019, by Last Menstrual Period presenting for routine prenatal visit  Plan   Pregnancy#4 Problems (from 09/18/18 to present)    Problem Noted  Resolved   Supervision of other normal pregnancy, antepartum 11/06/2018 by Vena Austria, MD No   Overview Addendum 02/02/2019 11:07 AM by Vena Austria, MD    [facility]inic [facility] Prenatal Labs  Dating LMP = 6 week [place] Blood type:  B pos  Genetic Screen Inheritest neg    NIPS: XX Antibody: negative  Anatomic [place] Normal Rubella:  Immune Varicella: Immune  GTT  RPR: NR  Rhogam N/A HBsAg: negative  TDaP vaccine                       Flu Shot: HIV: Negative  Baby Food                                GBS:   Contraception  Pap: 11/02/18 NIL HPV negative  CBB     CS/VBAC N/A   Support Person [name]          Advanced maternal age in multigravida 11/06/2018 by Vena Austria, MD No   Overview Addendum 11/29/2018  9:14 AM by Vena Austria, MD               Gestational age appropriate obstetric precautions including but not limited to vaginal bleeding, contractions, leaking of fluid and fetal movement were reviewed in detail with the patient.    Return in about 2 weeks (around 04/20/2019) for ROB.  Vena Austria, MD, [facility], [facility] 04/06/2019, 9:53 AM

## 2019-04-07 LAB — 28 WEEK RH+PANEL
Basophils Absolute: 0.1 10*3/uL (ref 0.0–0.2)
Basos: 1 %
EOS (ABSOLUTE): 0.2 10*3/uL (ref 0.0–0.4)
Eos: 2 %
Gestational Diabetes Screen: 78 mg/dL (ref 65–139)
HIV Screen 4th Generation wRfx: NONREACTIVE
Hematocrit: 31.8 % — ABNORMAL LOW (ref 34.0–46.6)
Hemoglobin: 10.5 g/dL — ABNORMAL LOW (ref 11.1–15.9)
Immature Grans (Abs): 0.1 10*3/uL (ref 0.0–0.1)
Immature Granulocytes: 1 %
Lymphocytes Absolute: 2.5 10*3/uL (ref 0.7–3.1)
Lymphs: 25 %
MCH: 30.8 pg (ref 26.6–33.0)
MCHC: 33 g/dL (ref 31.5–35.7)
MCV: 93 fL (ref 79–97)
Monocytes Absolute: 0.8 10*3/uL (ref 0.1–0.9)
Monocytes: 8 %
Neutrophils Absolute: 6.4 10*3/uL (ref 1.4–7.0)
Neutrophils: 63 %
Platelets: 258 10*3/uL (ref 150–450)
RBC: 3.41 x10E6/uL — ABNORMAL LOW (ref 3.77–5.28)
RDW: 11.8 % (ref 11.7–15.4)
RPR Ser Ql: NONREACTIVE
WBC: 10 10*3/uL (ref 3.4–10.8)

## 2019-04-19 ENCOUNTER — Other Ambulatory Visit: Payer: Self-pay | Admitting: Certified Nurse Midwife

## 2019-04-19 ENCOUNTER — Other Ambulatory Visit: Payer: Self-pay | Admitting: Obstetrics and Gynecology

## 2019-04-19 MED ORDER — FLUCONAZOLE 150 MG PO TABS: 150.0000 mg | ORAL_TABLET | Freq: Once | ORAL | 0 refills | Status: AC

## 2019-04-19 MED ORDER — NYSTATIN-TRIAMCINOLONE 100000-0.1 UNIT/GM-% EX OINT: 1.0000 "application " | TOPICAL_OINTMENT | Freq: Two times a day (BID) | CUTANEOUS | 0 refills | Status: DC

## 2019-04-19 MED ORDER — TERCONAZOLE 80 MG VA SUPP: 80.0000 mg | Freq: Every day | VAGINAL | 0 refills | Status: DC

## 2019-04-20 ENCOUNTER — Encounter: Payer: 59 | Admitting: Obstetrics and Gynecology

## 2019-05-08 ENCOUNTER — Ambulatory Visit (INDEPENDENT_AMBULATORY_CARE_PROVIDER_SITE_OTHER): Payer: 59 | Admitting: Obstetrics and Gynecology

## 2019-05-08 ENCOUNTER — Other Ambulatory Visit: Payer: Self-pay

## 2019-05-08 VITALS — BP 110/74 | Wt 181.0 lb

## 2019-05-08 DIAGNOSIS — Z3480 Encounter for supervision of other normal pregnancy, unspecified trimester: Secondary | ICD-10-CM

## 2019-05-08 DIAGNOSIS — O09523 Supervision of elderly multigravida, third trimester: Secondary | ICD-10-CM

## 2019-05-08 DIAGNOSIS — Z3A33 33 weeks gestation of pregnancy: Secondary | ICD-10-CM

## 2019-05-08 DIAGNOSIS — O09529 Supervision of elderly multigravida, unspecified trimester: Secondary | ICD-10-CM

## 2019-05-08 DIAGNOSIS — B373 Candidiasis of vulva and vagina: Secondary | ICD-10-CM

## 2019-05-08 DIAGNOSIS — B3731 Acute candidiasis of vulva and vagina: Secondary | ICD-10-CM

## 2019-05-08 MED ORDER — TERCONAZOLE 0.4 % VA CREA: 1.0000 | TOPICAL_CREAM | Freq: Every day | VAGINAL | 1 refills | Status: DC

## 2019-05-08 NOTE — Progress Notes (Signed)
ROB

## 2019-05-08 NOTE — Progress Notes (Signed)
    Routine Prenatal Care Visit  Subjective  Brittany Good is a 34 y.o. [id number] at [redacted]w[redacted]d being seen today for ongoing prenatal care.  She is currently monitored for the following issues for this low-risk pregnancy and has Acne; Anemia; Supervision of other normal pregnancy, antepartum; and Advanced maternal age in multigravida on their problem list.  ----------------------------------------------------------------------------------- Patient reports still having vaginal yeast symptoms after diflucan.   Contractions: Not present. Vag. Bleeding: None.  Movement: Present. Denies leaking of fluid.  ----------------------------------------------------------------------------------- The following portions of the patient's history were reviewed and updated as appropriate: allergies, current medications, past family history, past medical history, past social history, past surgical history and problem list. Problem list updated.   Objective  Blood pressure 110/74, weight 181 lb (82.1 kg), last menstrual period 09/18/2018, unknown if currently breastfeeding. Pregravid weight 150 lb (68 kg) Total Weight Gain 31 lb (14.1 kg) Urinalysis:      Fetal Status: Fetal Heart Rate (bpm): 145 Fundal Height: 32 cm Movement: Present  Presentation: Vertex  General:  Alert, oriented and cooperative. Patient is in no acute distress.  Skin: Skin is warm and dry. No rash noted.   Cardiovascular: Normal heart rate noted  Respiratory: Normal respiratory effort, no problems with respiration noted  Abdomen: Soft, gravid, appropriate for gestational age. Pain/Pressure: Absent     Pelvic:  Cervical exam deferred        Extremities: Normal range of motion.     ental Status: Normal mood and affect. Normal behavior. Normal judgment and thought content.     Assessment   34 y.o. G2R4270 at [redacted]w[redacted]d by  06/25/2019, by Last Menstrual Period presenting for routine prenatal visit  Plan   Pregnancy#4 Problems (from 09/18/18  to present)    Problem Noted Resolved   Supervision of other normal pregnancy, antepartum 11/06/2018 by Vena Austria, MD No   Overview Addendum 02/02/2019 11:07 AM by Vena Austria, MD    [facility]inic [facility] Prenatal Labs  Dating LMP = 6 week [place] Blood type:  B pos  Genetic Screen Inheritest neg    NIPS: XX Antibody: negative  Anatomic [place] Normal Rubella:  Immune Varicella: Immune  GTT  RPR: NR  Rhogam N/A HBsAg: negative  TDaP vaccine                       Flu Shot: HIV: Negative  Baby Food                                GBS:   Contraception  Pap: 11/02/18 NIL HPV negative  CBB     CS/VBAC N/A   Support Person [name]          Advanced maternal age in multigravida 11/06/2018 by Vena Austria, MD No   Overview Addendum 11/29/2018  9:14 AM by Vena Austria, MD               Gestational age appropriate obstetric precautions including but not limited to vaginal bleeding, contractions, leaking of fluid and fetal movement were reviewed in detail with the patient.    - Rx terzole 7  Return in about 2 weeks (around 05/22/2019) for ROB.  Vena Austria, MD, [facility], [facility] 05/08/2019, 4:52 PM

## 2019-05-09 DIAGNOSIS — Z0289 Encounter for other administrative examinations: Secondary | ICD-10-CM

## 2019-05-17 ENCOUNTER — Telehealth: Payer: Self-pay

## 2019-05-17 NOTE — Telephone Encounter (Signed)
FMLA/DISABILITY form for [employer] filled out, signature obtained and given to KT for processing. 

## 2019-05-22 ENCOUNTER — Other Ambulatory Visit: Payer: Self-pay

## 2019-05-22 ENCOUNTER — Ambulatory Visit (INDEPENDENT_AMBULATORY_CARE_PROVIDER_SITE_OTHER): Payer: 59 | Admitting: Obstetrics and Gynecology

## 2019-05-22 VITALS — BP 112/64 | Wt 187.0 lb

## 2019-05-22 DIAGNOSIS — Z3A35 35 weeks gestation of pregnancy: Secondary | ICD-10-CM

## 2019-05-22 DIAGNOSIS — O09523 Supervision of elderly multigravida, third trimester: Secondary | ICD-10-CM

## 2019-05-22 DIAGNOSIS — O09893 Supervision of other high risk pregnancies, third trimester: Secondary | ICD-10-CM

## 2019-05-22 LAB — POCT URINALYSIS DIPSTICK OB
Glucose, UA: NEGATIVE
POC,PROTEIN,UA: NEGATIVE

## 2019-05-22 NOTE — Progress Notes (Signed)
    Routine Prenatal Care Visit  Subjective  Brittany Good is a 34 y.o. [id number] at [redacted]w[redacted]d being seen today for ongoing prenatal care.  She is currently monitored for the following issues for this low-risk pregnancy and has Acne; Anemia; Supervision of other normal pregnancy, antepartum; and Advanced maternal age in multigravida on their problem list.  ----------------------------------------------------------------------------------- Patient reports no complaints.   Contractions: Not present. Vag. Bleeding: None.  Movement: Present. Denies leaking of fluid.  ----------------------------------------------------------------------------------- The following portions of the patient's history were reviewed and updated as appropriate: allergies, current medications, past family history, past medical history, past social history, past surgical history and problem list. Problem list updated.   Objective  Blood pressure 112/64, weight 187 lb (84.8 kg), last menstrual period 09/18/2018, unknown if currently breastfeeding. Pregravid weight 150 lb (68 kg) Total Weight Gain 37 lb (16.8 kg) Urinalysis:      Fetal Status: Fetal Heart Rate (bpm): 145 Fundal Height: 35 cm Movement: Present  Presentation: Vertex  General:  Alert, oriented and cooperative. Patient is in no acute distress.  Skin: Skin is warm and dry. No rash noted.   Cardiovascular: Normal heart rate noted  Respiratory: Normal respiratory effort, no problems with respiration noted  Abdomen: Soft, gravid, appropriate for gestational age. Pain/Pressure: Absent     Pelvic:  Cervical exam deferred        Extremities: Normal range of motion.     ental Status: Normal mood and affect. Normal behavior. Normal judgment and thought content.     Assessment   34 y.o. [id number] at [redacted]w[redacted]d by  06/25/2019, by Last Menstrual Period presenting for routine prenatal visit  Plan   Pregnancy#4 Problems (from 09/18/18 to present)    Problem Noted  Resolved   Supervision of other normal pregnancy, antepartum 11/06/2018 by Vena Austria, MD No   Overview Addendum 02/02/2019 11:07 AM by Vena Austria, MD    [facility]inic [facility] Prenatal Labs  Dating LMP = 6 week [place] Blood type:  B pos  Genetic Screen Inheritest neg    NIPS: XX Antibody: negative  Anatomic [place] Normal Rubella:  Immune Varicella: Immune  GTT  RPR: NR  Rhogam N/A HBsAg: negative  TDaP vaccine                       Flu Shot: HIV: Negative  Baby Food                                GBS:   Contraception  Pap: 11/02/18 NIL HPV negative  CBB     CS/VBAC N/A   Support Person [name]          Advanced maternal age in multigravida 11/06/2018 by Vena Austria, MD No   Overview Addendum 11/29/2018  9:14 AM by Vena Austria, MD               Gestational age appropriate obstetric precautions including but not limited to vaginal bleeding, contractions, leaking of fluid and fetal movement were reviewed in detail with the patient.    Return in about 1 week (around 05/29/2019) for ROB.  Vena Austria, MD, [facility], [facility] 05/22/2019, 5:09 PM

## 2019-05-22 NOTE — Lactation Note (Signed)
Lactation Consultation Note  Patient Name: DAWANA ASPER YOYOO'J Date: 05/22/2019     Lactation student discussed benefits of breastfeeding per the Ready, Set, Baby curriculum. Nicholaus Corolla Ciani encouraged to review breastfeeding information on Ready, set, Baby web site and given information for virtual breastfeeding classes.    Consult Status      Willa Rough Amarissa Koerner 05/22/2019, 5:09 PM

## 2019-05-22 NOTE — Progress Notes (Signed)
ROB

## 2019-05-30 ENCOUNTER — Ambulatory Visit (INDEPENDENT_AMBULATORY_CARE_PROVIDER_SITE_OTHER): Payer: 59 | Admitting: Obstetrics and Gynecology

## 2019-05-30 ENCOUNTER — Other Ambulatory Visit: Payer: Self-pay

## 2019-05-30 VITALS — BP 127/76 | Wt 187.0 lb

## 2019-05-30 DIAGNOSIS — O09529 Supervision of elderly multigravida, unspecified trimester: Secondary | ICD-10-CM | POA: Diagnosis not present

## 2019-05-30 DIAGNOSIS — O09893 Supervision of other high risk pregnancies, third trimester: Secondary | ICD-10-CM

## 2019-05-30 DIAGNOSIS — Z3480 Encounter for supervision of other normal pregnancy, unspecified trimester: Secondary | ICD-10-CM | POA: Diagnosis not present

## 2019-05-30 DIAGNOSIS — Z3A36 36 weeks gestation of pregnancy: Secondary | ICD-10-CM

## 2019-05-30 DIAGNOSIS — Z3685 Encounter for antenatal screening for Streptococcus B: Secondary | ICD-10-CM | POA: Diagnosis not present

## 2019-05-30 LAB — POCT URINALYSIS DIPSTICK OB
Glucose, UA: NEGATIVE
POC,PROTEIN,UA: NEGATIVE

## 2019-05-30 NOTE — Progress Notes (Signed)
    Routine Prenatal Care Visit  Subjective  Brittany Good is a 34 y.o. [id number] at [redacted]w[redacted]d being seen today for ongoing prenatal care.  She is currently monitored for the following issues for this low-risk pregnancy and has Acne; Anemia; Supervision of other normal pregnancy, antepartum; and Advanced maternal age in multigravida on their problem list.  ----------------------------------------------------------------------------------- Patient reports no complaints.   Contractions: Irregular. Vag. Bleeding: None.  Movement: Present. Denies leaking of fluid.  ----------------------------------------------------------------------------------- The following portions of the patient's history were reviewed and updated as appropriate: allergies, current medications, past family history, past medical history, past social history, past surgical history and problem list. Problem list updated.   Objective  Blood pressure 127/76, weight 187 lb (84.8 kg), last menstrual period 09/18/2018, unknown if currently breastfeeding. Pregravid weight 150 lb (68 kg) Total Weight Gain 37 lb (16.8 kg) Urinalysis:      Fetal Status: Fetal Heart Rate (bpm): 135 Fundal Height: 35 cm Movement: Present  Presentation: Vertex  General:  Alert, oriented and cooperative. Patient is in no acute distress.  Skin: Skin is warm and dry. No rash noted.   Cardiovascular: Normal heart rate noted  Respiratory: Normal respiratory effort, no problems with respiration noted  Abdomen: Soft, gravid, appropriate for gestational age. Pain/Pressure: Absent     Pelvic:  Cervical exam deferred        Extremities: Normal range of motion.     ental Status: Normal mood and affect. Normal behavior. Normal judgment and thought content.     Assessment   34 y.o. [id number] at [redacted]w[redacted]d by  06/25/2019, by Last Menstrual Period presenting for routine prenatal visit  Plan   Pregnancy#4 Problems (from 09/18/18 to present)    Problem Noted Resolved    Supervision of other normal pregnancy, antepartum 11/06/2018 by Vena Austria, MD No   Overview Addendum 02/02/2019 11:07 AM by Vena Austria, MD    [facility]inic [facility] Prenatal Labs  Dating LMP = 6 week [place] Blood type:  B pos  Genetic Screen Inheritest neg    NIPS: XX Antibody: negative  Anatomic [place] Normal Rubella:  Immune Varicella: Immune  GTT  RPR: NR  Rhogam N/A HBsAg: negative  TDaP vaccine                       Flu Shot: HIV: Negative  Baby Food                                GBS:   Contraception  Pap: 11/02/18 NIL HPV negative  CBB     CS/VBAC N/A   Support Person [name]          Advanced maternal age in multigravida 11/06/2018 by Vena Austria, MD No   Overview Addendum 11/29/2018  9:14 AM by Vena Austria, MD               Gestational age appropriate obstetric precautions including but not limited to vaginal bleeding, contractions, leaking of fluid and fetal movement were reviewed in detail with the patient.    Return in about 1 week (around 06/06/2019) for ROB.  Vena Austria, MD, [facility], [facility] 05/30/2019, 11:33 AM

## 2019-05-30 NOTE — Progress Notes (Signed)
ROB GBS today 

## 2019-06-01 ENCOUNTER — Ambulatory Visit
Admission: RE | Admit: 2019-06-01 | Discharge: 2019-06-01 | Disposition: A | Discharge status: Home / Self Care | Payer: 59 | Source: Ambulatory Visit | Attending: Obstetrics and Gynecology | Admitting: Obstetrics and Gynecology

## 2019-06-01 ENCOUNTER — Other Ambulatory Visit: Payer: Self-pay | Admitting: Obstetrics and Gynecology

## 2019-06-01 ENCOUNTER — Other Ambulatory Visit: Payer: Self-pay

## 2019-06-01 ENCOUNTER — Telehealth: Payer: Self-pay | Admitting: Obstetrics and Gynecology

## 2019-06-01 DIAGNOSIS — Z3A36 36 weeks gestation of pregnancy: Secondary | ICD-10-CM

## 2019-06-01 DIAGNOSIS — M7989 Other specified soft tissue disorders: Secondary | ICD-10-CM | POA: Insufficient documentation

## 2019-06-01 DIAGNOSIS — Z3480 Encounter for supervision of other normal pregnancy, unspecified trimester: Secondary | ICD-10-CM

## 2019-06-01 LAB — STREP GP B NAA: Strep Gp B NAA: NEGATIVE

## 2019-06-01 NOTE — Telephone Encounter (Signed)
Called pt to adv that she has an appt for U/S at 3:00pm today [facility]

## 2019-06-01 NOTE — Progress Notes (Signed)
Patient noting asymmetric lower extremity swelling left greater than right.  No pain, redness, or shortness of breath.  Other than pregnancy no DVT risk factors.

## 2019-06-08 ENCOUNTER — Other Ambulatory Visit: Payer: Self-pay

## 2019-06-08 ENCOUNTER — Ambulatory Visit (INDEPENDENT_AMBULATORY_CARE_PROVIDER_SITE_OTHER): Payer: 59 | Admitting: Obstetrics and Gynecology

## 2019-06-08 VITALS — BP 124/70 | Wt 192.0 lb

## 2019-06-08 DIAGNOSIS — Z3A37 37 weeks gestation of pregnancy: Secondary | ICD-10-CM

## 2019-06-08 DIAGNOSIS — O09523 Supervision of elderly multigravida, third trimester: Secondary | ICD-10-CM

## 2019-06-08 DIAGNOSIS — O09529 Supervision of elderly multigravida, unspecified trimester: Secondary | ICD-10-CM

## 2019-06-08 DIAGNOSIS — Z3480 Encounter for supervision of other normal pregnancy, unspecified trimester: Secondary | ICD-10-CM

## 2019-06-08 DIAGNOSIS — O09893 Supervision of other high risk pregnancies, third trimester: Secondary | ICD-10-CM

## 2019-06-08 LAB — POCT URINALYSIS DIPSTICK OB
Glucose, UA: NEGATIVE
POC,PROTEIN,UA: NEGATIVE

## 2019-06-08 NOTE — Progress Notes (Signed)
ROB

## 2019-06-08 NOTE — Progress Notes (Signed)
    Routine Prenatal Care Visit  Subjective  Brittany Good is a 34 y.o. [id number] at [redacted]w[redacted]d being seen today for ongoing prenatal care.  She is currently monitored for the following issues for this low-risk pregnancy and has Acne; Anemia; Supervision of other normal pregnancy, antepartum; and Advanced maternal age in multigravida on their problem list.  ----------------------------------------------------------------------------------- Patient reports no complaints.   Contractions: Irregular. Vag. Bleeding: None.  Movement: Present. Denies leaking of fluid.  ----------------------------------------------------------------------------------- The following portions of the patient's history were reviewed and updated as appropriate: allergies, current medications, past family history, past medical history, past social history, past surgical history and problem list. Problem list updated.   Objective  Blood pressure 124/70, weight 192 lb (87.1 kg), last menstrual period 09/18/2018, unknown if currently breastfeeding. Pregravid weight 150 lb (68 kg) Total Weight Gain 42 lb (19.1 kg) Urinalysis:      Fetal Status: Fetal Heart Rate (bpm): 140 Fundal Height: 36 cm Movement: Present  Presentation: Vertex  General:  Alert, oriented and cooperative. Patient is in no acute distress.  Skin: Skin is warm and dry. No rash noted.   Cardiovascular: Normal heart rate noted  Respiratory: Normal respiratory effort, no problems with respiration noted  Abdomen: Soft, gravid, appropriate for gestational age. Pain/Pressure: Absent     Pelvic:  Cervical exam deferred        Extremities: Normal range of motion.     ental Status: Normal mood and affect. Normal behavior. Normal judgment and thought content.     Assessment   34 y.o. [id number] at [redacted]w[redacted]d by  06/25/2019, by Last Menstrual Period presenting for routine prenatal visit  Plan   Pregnancy#4 Problems (from 09/18/18 to present)    Problem Noted Resolved    Supervision of other normal pregnancy, antepartum 11/06/2018 by Vena Austria, MD No   Overview Addendum 02/02/2019 11:07 AM by Vena Austria, MD    [facility]inic [facility] Prenatal Labs  Dating LMP = 6 week [place] Blood type:  B pos  Genetic Screen Inheritest neg    NIPS: XX Antibody: negative  Anatomic [place] Normal Rubella:  Immune Varicella: Immune  GTT  RPR: NR  Rhogam N/A HBsAg: negative  TDaP vaccine                       Flu Shot: HIV: Negative  Baby Food                                GBS:   Contraception  Pap: 11/02/18 NIL HPV negative  CBB     CS/VBAC N/A   Support Person Brittany Good          Advanced maternal age in multigravida 11/06/2018 by Vena Austria, MD No   Overview Addendum 11/29/2018  9:14 AM by Vena Austria, MD               Gestational age appropriate obstetric precautions including but not limited to vaginal bleeding, contractions, leaking of fluid and fetal movement were reviewed in detail with the patient.    Return in about 1 week (around 06/15/2019) for ROB.  Vena Austria, MD, [facility], [facility] 06/08/2019, 12:00 PM

## 2019-06-13 ENCOUNTER — Other Ambulatory Visit: Payer: Self-pay

## 2019-06-13 ENCOUNTER — Ambulatory Visit (INDEPENDENT_AMBULATORY_CARE_PROVIDER_SITE_OTHER): Payer: 59 | Admitting: Certified Nurse Midwife

## 2019-06-13 VITALS — BP 120/60 | Wt 191.0 lb

## 2019-06-13 DIAGNOSIS — Z3480 Encounter for supervision of other normal pregnancy, unspecified trimester: Secondary | ICD-10-CM

## 2019-06-13 DIAGNOSIS — Z3483 Encounter for supervision of other normal pregnancy, third trimester: Secondary | ICD-10-CM

## 2019-06-13 DIAGNOSIS — Z3A38 38 weeks gestation of pregnancy: Secondary | ICD-10-CM

## 2019-06-13 LAB — POCT URINALYSIS DIPSTICK OB
Glucose, UA: NEGATIVE
POC,PROTEIN,UA: NEGATIVE

## 2019-06-13 NOTE — Progress Notes (Signed)
Would like cx ck.rj

## 2019-06-16 NOTE — Progress Notes (Signed)
ROB at 38wk 2d: Good fetal movement. Irregular contractions Breast feeding/ undecided about contraception  Cephalic presentation confirmed with ultrasound Cervix: Long/ closed  A: IUP at 38wk2d  P: ROB in 1 week Labor precautions   Farrel Conners, CNM

## 2019-06-18 ENCOUNTER — Encounter: Payer: 59 | Admitting: Obstetrics and Gynecology

## 2019-06-21 ENCOUNTER — Other Ambulatory Visit: Payer: Self-pay

## 2019-06-21 ENCOUNTER — Ambulatory Visit (INDEPENDENT_AMBULATORY_CARE_PROVIDER_SITE_OTHER): Payer: 59 | Admitting: Certified Nurse Midwife

## 2019-06-21 VITALS — BP 118/78 | Wt 194.0 lb

## 2019-06-21 DIAGNOSIS — Z3A39 39 weeks gestation of pregnancy: Secondary | ICD-10-CM

## 2019-06-21 DIAGNOSIS — Z3483 Encounter for supervision of other normal pregnancy, third trimester: Secondary | ICD-10-CM

## 2019-06-21 DIAGNOSIS — Z3480 Encounter for supervision of other normal pregnancy, unspecified trimester: Secondary | ICD-10-CM

## 2019-06-21 DIAGNOSIS — Z01812 Encounter for preprocedural laboratory examination: Secondary | ICD-10-CM

## 2019-06-21 NOTE — Progress Notes (Signed)
ROB No concerns Denies lof, no vb, Good FM Cervical check/membrane sweep if possible

## 2019-06-21 NOTE — Progress Notes (Signed)
ROB at 39wk3d: Having some superficial paresthesias at the sternal area/ insertion of rectus abdominus muscle; irregular contractions, increased pelvic pressure. More swelling in lower extremities. Baby moving well. Tonight is the last night of work.  FH 38cm/ FHT 141/ BP 118/78 +1pitting edema pretibially. +pedal edema. Wears TED stockings at work Cervix: Thick/ 0.5cm/posterior/soft/ -1 to -2  A: IUP at 39wk3d Increasing discomforts of pregnancy  P: Discussed IOL next week. Scheduled for 28 June 798 RTO 13 April for ROB with Dr Bonney Aid Covid testing 13 April Labor precautions  Farrel Conners, CNM

## 2019-06-23 ENCOUNTER — Inpatient Hospital Stay
Admission: EM | Admit: 2019-06-23 | Discharge: 2019-06-24 | DRG: 806 | Disposition: A | Discharge status: Home / Self Care | Payer: 59 | Attending: Obstetrics & Gynecology | Admitting: Obstetrics & Gynecology

## 2019-06-23 ENCOUNTER — Other Ambulatory Visit: Payer: Self-pay

## 2019-06-23 ENCOUNTER — Encounter: Payer: Self-pay | Admitting: Obstetrics & Gynecology

## 2019-06-23 DIAGNOSIS — Z3480 Encounter for supervision of other normal pregnancy, unspecified trimester: Secondary | ICD-10-CM

## 2019-06-23 DIAGNOSIS — Z20822 Contact with and (suspected) exposure to covid-19: Secondary | ICD-10-CM | POA: Diagnosis present

## 2019-06-23 DIAGNOSIS — Z3A39 39 weeks gestation of pregnancy: Secondary | ICD-10-CM

## 2019-06-23 DIAGNOSIS — O323XX0 Maternal care for face, brow and chin presentation, not applicable or unspecified: Principal | ICD-10-CM | POA: Diagnosis present

## 2019-06-23 DIAGNOSIS — D62 Acute posthemorrhagic anemia: Secondary | ICD-10-CM | POA: Diagnosis not present

## 2019-06-23 DIAGNOSIS — O9081 Anemia of the puerperium: Secondary | ICD-10-CM | POA: Diagnosis not present

## 2019-06-23 DIAGNOSIS — O26893 Other specified pregnancy related conditions, third trimester: Secondary | ICD-10-CM | POA: Diagnosis present

## 2019-06-23 DIAGNOSIS — O09529 Supervision of elderly multigravida, unspecified trimester: Secondary | ICD-10-CM

## 2019-06-23 LAB — RESPIRATORY PANEL BY RT PCR (FLU A&B, COVID)
Influenza A by PCR: NEGATIVE
Influenza B by PCR: NEGATIVE
SARS Coronavirus 2 by RT PCR: NEGATIVE

## 2019-06-23 LAB — CBC
HCT: 33.9 % — ABNORMAL LOW (ref 36.0–46.0)
Hemoglobin: 10.6 g/dL — ABNORMAL LOW (ref 12.0–15.0)
MCH: 26.3 pg (ref 26.0–34.0)
MCHC: 31.3 g/dL (ref 30.0–36.0)
MCV: 84.1 fL (ref 80.0–100.0)
Platelets: 295 10*3/uL (ref 150–400)
RBC: 4.03 MIL/uL (ref 3.87–5.11)
RDW: 13.9 % (ref 11.5–15.5)
WBC: 13.6 10*3/uL — ABNORMAL HIGH (ref 4.0–10.5)
nRBC: 0 % (ref 0.0–0.2)

## 2019-06-23 LAB — TYPE AND SCREEN
ABO/RH(D): B POS
Antibody Screen: NEGATIVE

## 2019-06-23 LAB — RPR: RPR Ser Ql: NONREACTIVE

## 2019-06-23 MED ORDER — LIDOCAINE HCL (PF) 1 % IJ SOLN: 30.0000 mL | INTRAMUSCULAR | Status: AC | PRN

## 2019-06-23 MED ORDER — LIDOCAINE HCL (PF) 1 % IJ SOLN
INTRAMUSCULAR | Status: AC
  Administered 2019-06-23: 30 mL via SUBCUTANEOUS
  Filled 2019-06-23: qty 30

## 2019-06-23 MED ORDER — SODIUM CHLORIDE 0.9% FLUSH: 3.0000 mL | Freq: Two times a day (BID) | INTRAVENOUS | Status: DC

## 2019-06-23 MED ORDER — WITCH HAZEL-GLYCERIN EX PADS
1.0000 "application " | MEDICATED_PAD | CUTANEOUS | Status: DC | PRN
  Filled 2019-06-23: qty 100

## 2019-06-23 MED ORDER — LACTATED RINGERS IV SOLN: 500.0000 mL | INTRAVENOUS | Status: DC | PRN

## 2019-06-23 MED ORDER — SODIUM CHLORIDE 0.9% FLUSH: 3.0000 mL | INTRAVENOUS | Status: DC | PRN

## 2019-06-23 MED ORDER — ONDANSETRON HCL 4 MG/2ML IJ SOLN: 4.0000 mg | Freq: Four times a day (QID) | INTRAMUSCULAR | Status: DC | PRN

## 2019-06-23 MED ORDER — OXYCODONE-ACETAMINOPHEN 5-325 MG PO TABS: 1.0000 | ORAL_TABLET | ORAL | Status: DC | PRN

## 2019-06-23 MED ORDER — OXYCODONE-ACETAMINOPHEN 5-325 MG PO TABS: 2.0000 | ORAL_TABLET | ORAL | Status: DC | PRN

## 2019-06-23 MED ORDER — OXYTOCIN 10 UNIT/ML IJ SOLN
10.0000 [IU] | Freq: Once | INTRAMUSCULAR | Status: DC
  Filled 2019-06-23: qty 1

## 2019-06-23 MED ORDER — SODIUM CHLORIDE 0.9 % IV SOLN: 250.0000 mL | INTRAVENOUS | Status: DC | PRN

## 2019-06-23 MED ORDER — OXYTOCIN 40 UNITS IN NORMAL SALINE INFUSION - SIMPLE MED: 2.5000 [IU]/h | INTRAVENOUS | Status: DC

## 2019-06-23 MED ORDER — ZOLPIDEM TARTRATE 5 MG PO TABS: 5.0000 mg | ORAL_TABLET | Freq: Every evening | ORAL | Status: DC | PRN

## 2019-06-23 MED ORDER — ONDANSETRON HCL 4 MG PO TABS: 4.0000 mg | ORAL_TABLET | ORAL | Status: DC | PRN

## 2019-06-23 MED ORDER — ACETAMINOPHEN 325 MG PO TABS: 650.0000 mg | ORAL_TABLET | ORAL | Status: DC | PRN

## 2019-06-23 MED ORDER — MISOPROSTOL 200 MCG PO TABS
ORAL_TABLET | ORAL | Status: AC
  Filled 2019-06-23: qty 4

## 2019-06-23 MED ORDER — LACTATED RINGERS IV SOLN: INTRAVENOUS | Status: DC

## 2019-06-23 MED ORDER — COCONUT OIL OIL
1.0000 "application " | TOPICAL_OIL | Status: DC | PRN
  Filled 2019-06-23: qty 120

## 2019-06-23 MED ORDER — ONDANSETRON HCL 4 MG/2ML IJ SOLN: 4.0000 mg | INTRAMUSCULAR | Status: DC | PRN

## 2019-06-23 MED ORDER — BENZOCAINE-MENTHOL 20-0.5 % EX AERO
1.0000 "application " | INHALATION_SPRAY | CUTANEOUS | Status: DC | PRN
  Filled 2019-06-23: qty 56

## 2019-06-23 MED ORDER — IBUPROFEN 600 MG PO TABS
600.0000 mg | ORAL_TABLET | Freq: Four times a day (QID) | ORAL | Status: DC
  Filled 2019-06-23: qty 1

## 2019-06-23 MED ORDER — OXYTOCIN 10 UNIT/ML IJ SOLN
INTRAMUSCULAR | Status: AC
  Administered 2019-06-23: 10 [IU]
  Filled 2019-06-23: qty 2

## 2019-06-23 MED ORDER — SENNOSIDES-DOCUSATE SODIUM 8.6-50 MG PO TABS: 2.0000 | ORAL_TABLET | ORAL | Status: DC

## 2019-06-23 MED ORDER — DIPHENHYDRAMINE HCL 25 MG PO CAPS: 25.0000 mg | ORAL_CAPSULE | Freq: Four times a day (QID) | ORAL | Status: DC | PRN

## 2019-06-23 MED ORDER — OXYTOCIN BOLUS FROM INFUSION: 500.0000 mL | Freq: Once | INTRAVENOUS | Status: DC

## 2019-06-23 MED ORDER — AMMONIA AROMATIC IN INHA
RESPIRATORY_TRACT | Status: AC
  Filled 2019-06-23: qty 10

## 2019-06-23 MED ORDER — SIMETHICONE 80 MG PO CHEW: 80.0000 mg | CHEWABLE_TABLET | ORAL | Status: DC | PRN

## 2019-06-23 MED ORDER — BUTORPHANOL TARTRATE 1 MG/ML IJ SOLN: 1.0000 mg | INTRAMUSCULAR | Status: DC | PRN

## 2019-06-23 MED ORDER — DIBUCAINE (PERIANAL) 1 % EX OINT: 1.0000 "application " | TOPICAL_OINTMENT | CUTANEOUS | Status: DC | PRN

## 2019-06-23 NOTE — Discharge Summary (Signed)
OB Discharge Summary     Patient Name: Brittany Good DOB: Oct 08, 1985 MRN: [id number]  Date of admission: 06/23/2019 Delivering MD: Letitia Libra, MD  Date of Delivery: 06/23/2019  Date of discharge: 06/24/2019  Admitting diagnosis: Face presentation of fetus [O32.3XX0], Term Pregnancy, Precipitous Delivery Intrauterine pregnancy: [redacted]w[redacted]d     Secondary diagnosis: None     Discharge diagnosis: Term Pregnancy Delivered, same                         Hospital course:  Onset of Labor With Vaginal Delivery     34 y.o. yo [id number] at [redacted]w[redacted]d was admitted in Active Labor on 06/23/2019. Patient had an uncomplicated labor course as follows:  Membrane Rupture Time/Date: 1:44 AM ,06/23/2019   Intrapartum Procedures: Episiotomy: None [1]                                         Lacerations:  1st degree [2]  Patient had a delivery of a Viable infant. 06/23/2019  Information for the patient's newborn:  Sakia, Schrimpf Girl Sieara [[id number]]  Delivery Method: Vag-Spont     Pateint had an uncomplicated postpartum course.  She is ambulating, tolerating a regular diet, passing flatus, and urinating well. Patient is discharged home in stable condition on 06/24/19.                                                                  Post partum procedures:none  Complications: None  Physical exam on 06/24/2019: Vitals:   06/23/19 1124 06/23/19 1637 06/23/19 2337 06/24/19 0729  BP: 122/68 108/63 102/65 (!) 94/48  Pulse: 63 74 68 72  Resp: 18 20 20 20   Temp: 98.8 F (37.1 C) 98.5 F (36.9 C) 98.3 F (36.8 C) 98.3 F (36.8 C)  TempSrc: Oral Oral Oral Oral  SpO2: 97% 98% 98% 96%  Weight:      Height:       General: alert, cooperative and no distress Lochia: appropriate Uterine Fundus: firm Incision: N/A DVT Evaluation: No evidence of DVT seen on physical exam.  Labs: Lab Results  Component Value Date   WBC 13.6 (H) 06/23/2019   HGB 10.6 (L) 06/23/2019   HCT 33.9 (L) 06/23/2019   MCV 84.1  06/23/2019   PLT 295 06/23/2019   CMP Latest Ref Rng & Units 07/19/2017  Glucose 65 - 99 mg/dL 89  BUN 7 - 25 mg/dL 19  Creatinine 09/18/2017 - 1.30 mg/dL 8.65  Sodium 7.84 - 696 mmol/L 137  Potassium 3.5 - 5.3 mmol/L 4.3  Chloride 98 - 110 mmol/L 104  CO2 20 - 32 mmol/L 24  Calcium 8.6 - 10.2 mg/dL 9.2  Total Protein 6.1 - 8.1 g/dL 7.1  Total Bilirubin 0.2 - 1.2 mg/dL 0.6  Alkaline Phos 38 - 126 U/L -  AST 10 - 30 U/L 16  ALT 6 - 29 U/L 12    Discharge instruction: per After Visit Summary.  Medications:  Allergies as of 06/24/2019   No Known Allergies     Medication List    TAKE these medications   docusate sodium 100 MG capsule Commonly known as:  COLACE Take 100 mg by mouth 2 (two) times daily.   omeprazole 20 MG capsule Commonly known as: PRILOSEC Take 20 mg by mouth daily.   polyethylene glycol 17 g packet Commonly known as: MIRALAX / GLYCOLAX Take 17 g by mouth daily.   WOMENS MULTIVITAMIN PLUS PO Take by mouth.            Discharge Care Instructions  (From admission, onward)         Start     Ordered   06/24/19 0000  Discharge wound care:    Comments: SHOWER DAILY Wash incision gently with soap and water.  Call office with any drainage, redness, or firmness of the incision.   06/24/19 0741          Diet: routine diet  Activity: Advance as tolerated. Pelvic rest for 6 weeks.   Outpatient follow up: Follow-up Information    Gae Dry, MD. Schedule an appointment as soon as possible for a visit in 6 week(s).   Specialty: Obstetrics and Gynecology Contact information: [address] [place] [address] [phone]             Postpartum contraception: Not Discussed Rhogam Given postpartum: no Rubella vaccine given postpartum: no Varicella vaccine given postpartum: no TDaP given antepartum or postpartum: Yes  Newborn Data: Live born female  Birth Weight:   APGAR: 55, 9  Newborn Delivery   Birth date/time: 06/23/2019  01:56:00 Delivery type: Vaginal, Spontaneous     Baby Feeding: Breast  Disposition:home with mother  SIGNED: Homero Fellers, MD 06/24/2019 7:42 AM

## 2019-06-23 NOTE — Discharge Instructions (Signed)

## 2019-06-23 NOTE — H&P (Signed)
Obstetrics Admission History & Physical   CC: Labor pains  HPI:  34 y.o. [id number] @ [redacted]w[redacted]d (06/25/2019, by Last Menstrual Period). Admitted on 06/23/2019:   Patient Active Problem List   Diagnosis Date Noted  . Face presentation of fetus 06/23/2019  . Labor, precipitous 06/23/2019  . Supervision of other normal pregnancy, antepartum 11/06/2018  . Advanced maternal age in multigravida 11/06/2018  . Acne 06/29/2017  . Anemia 06/29/2017     Presents for labor pains w ctxs earlier this evening.  No VB.  ROM at time I entered the room, reported to be completely dilated as well.  Concern by RN for face presentation.   Prenatal care at: at [facility]. Pregnancy complicated by none.  ROS: A review of systems was performed and negative, except as stated in the above HPI. PMHx:  Past Medical History:  Diagnosis Date  . Acne   . Miscarriage    PSHx:  Past Surgical History:  Procedure Laterality Date  . BREAST ENHANCEMENT SURGERY    . BREAST ENHANCEMENT SURGERY     2014  . DILATION AND CURETTAGE OF UTERUS     2018  . DILATION AND EVACUATION N/A 12/07/2016   Procedure: DILATATION AND EVACUATION;  Surgeon: Will Bonnet, MD;  Location: [facility] ORS;  Service: Gynecology;  Laterality: N/A;  . LASIK    . LASIK     2009  . TONSILLECTOMY     1992  . TONSILLECTOMY AND ADENOIDECTOMY     Medications:  Medications Prior to Admission  Medication Sig Dispense Refill Last Dose  . docusate sodium (COLACE) 100 MG capsule Take 100 mg by mouth 2 (two) times daily.   06/22/2019 at Unknown time  . Multiple Vitamins-Minerals (WOMENS MULTIVITAMIN PLUS PO) Take by mouth.   06/22/2019 at Unknown time  . omeprazole (PRILOSEC) 20 MG capsule Take 20 mg by mouth daily.   06/22/2019 at Unknown time  . polyethylene glycol (MIRALAX / GLYCOLAX) 17 g packet Take 17 g by mouth daily.   06/22/2019 at Unknown time   Allergies: has No Known Allergies. OBHx:  OB History  Gravida Para Term Preterm AB Living  4 2 2     2    SAB TAB Ectopic Multiple Live Births          2    # Outcome Date GA Lbr Len/2nd Weight Sex Delivery Anes PTL Lv  4 Current           3 Term 09/25/09     Vag-Spont   LIV  2 Term 12/29/05    F Vag-Spont   LIV  1 Gravida            AVW:[id number]/[id number] except as detailed in HPI.Marland Kitchen  No family history of birth defects. Soc Hx: Alcohol: none, Recreational drug use: none, Denies domestic abuse and Pregnancy welcomed  Objective:  There were no vitals filed for this visit. Constitutional: Well nourished, well developed female in no acute distress.  HEENT: normal Skin: Warm and dry.  Cardiovascular:Regular rate and rhythm.   Extremity: trace to 1+ bilateral pedal edema Respiratory: Clear to auscultation bilateral. Normal respiratory effort Abdomen: gravid, ND, FHT present, moderate tenderness on exam Back: no CVAT Neuro: DTRs 2+, Cranial nerves grossly intact Psych: Alert and Oriented x3. No memory deficits. Normal mood and affect.  MS: normal gait, normal bilateral lower extremity ROM/strength/stability.  Pelvic exam: is not limited by body habitus EGBUS: within normal limits Vagina: within normal limits and with normal mucosa Cervix: EXTERNAL GENITALIA:  normal appearing vulva with no masses, tenderness or lesions CERVIX: 10 cm dilated, 100 effaced, +3 station, presenting part OP face presentation VAGINA: no abnormalities noted MEMBRANES: ruptured, clear fluid Uterus: Spontaneous uterine activity  Adnexa: not evaluated  EFM:FHR: 140 bpm, variability: moderate,  accelerations:  Present,  decelerations:  Absent Toco: Frequency: Every 2-3 minutes   Perinatal info:  Blood type: B positive Rubella- Immune Varicella -Immune TDaP Given during third trimester of this pregnancy RPR NR / HIV Neg/ HBsAg Neg   Assessment & Plan:   34 y.o. [id number] @ [redacted]w[redacted]d, Admitted on 06/23/2019: Active labor, precipitous    Admit for labor, Fetal Wellbeing Reassuring and GBS status neg, treat as  needed  Annamarie Major, MD, [facility], [facility] 06/23/2019  2:21 AM

## 2019-06-23 NOTE — Lactation Note (Addendum)
This note was copied from a baby's chart. Lactation Consultation Note  Patient Name: [name] [id number] Date: 06/23/2019 Reason for consult: Initial assessment;Term  Mom breast feeding Ariai independently lying on her side.  DEBP kit given.  Coconut oil given and instructed in use.  Lactation name and number written on white board and encouraged to call with any questions, concerns or assistance.   Maternal Data Formula Feeding for Exclusion: No Has patient been taught Hand Expression?: Yes Does the patient have breastfeeding experience prior to this delivery?: Yes  Feeding Feeding Type: Breast Fed  LATCH Score Latch: Grasps breast easily, tongue down, lips flanged, rhythmical sucking.  Audible Swallowing: A few with stimulation  Type of Nipple: Everted at rest and after stimulation  Comfort (Breast/Nipple): Soft / non-tender  Hold (Positioning): No assistance needed to correctly position infant at breast.  LATCH Score: 9  Interventions Interventions: Breast feeding basics reviewed;DEBP  Lactation Tools Discussed/Used Tools: Coconut oil WIC Program: No(Has [employer])   Consult Status Consult Status: PRN    Jarold Motto 06/23/2019, 11:29 AM

## 2019-06-24 LAB — CBC
HCT: 29.1 % — ABNORMAL LOW (ref 36.0–46.0)
Hemoglobin: 9.3 g/dL — ABNORMAL LOW (ref 12.0–15.0)
MCH: 26.3 pg (ref 26.0–34.0)
MCHC: 32 g/dL (ref 30.0–36.0)
MCV: 82.4 fL (ref 80.0–100.0)
Platelets: 227 10*3/uL (ref 150–400)
RBC: 3.53 MIL/uL — ABNORMAL LOW (ref 3.87–5.11)
RDW: 14.3 % (ref 11.5–15.5)
WBC: 12.9 10*3/uL — ABNORMAL HIGH (ref 4.0–10.5)
nRBC: 0 % (ref 0.0–0.2)

## 2019-06-24 NOTE — Progress Notes (Signed)
Reviewed D/C instructions with pt and family. Pt verbalized understanding of teaching. Discharged to home via W/C. Pt to schedule f/u appt.  

## 2019-06-24 NOTE — Progress Notes (Signed)
Subjective:  She is doing well. Resting comfortably in bed. Pain control is adequate. Voiding without difficulty. Tolerating a regular diet. Ambulating well. She would like to be discharged home today.  Objective:   Blood pressure (!) 94/48, pulse 72, temperature 98.3 F (36.8 C), temperature source Oral, resp. rate 20, height 5\' 8"  (1.727 m), weight 88 kg, last menstrual period 09/18/2018, SpO2 96 %, unknown if currently breastfeeding.  General: NAD Pulmonary: no increased work of breathing Abdomen: non-distended, non-tender Uterus:  fundus firm at U; lochia normal Extremities: no edema, no erythema, no tenderness, no signs of DVT  Results for orders placed or performed during the hospital encounter of 06/23/19 (from the past 72 hour(s))  CBC     Status: Abnormal   Collection Time: 06/23/19  1:44 AM  Result Value Ref Range   WBC 13.6 (H) 4.0 - 10.5 K/uL   RBC 4.03 3.87 - 5.11 MIL/uL   Hemoglobin 10.6 (L) 12.0 - 15.0 g/dL   HCT 33.9 (L) 36.0 - 46.0 %   MCV 84.1 80.0 - 100.0 fL   MCH 26.3 26.0 - 34.0 pg   MCHC 31.3 30.0 - 36.0 g/dL   RDW 13.9 11.5 - 15.5 %   Platelets 295 150 - 400 K/uL   nRBC 0.0 0.0 - 0.2 %    Comment: Performed at [facility], [address] [place], [place] [address]  Type and screen [place]     Status: None   Collection Time: 06/23/19  1:44 AM  Result Value Ref Range   ABO/RH(D) B POS    Antibody Screen NEG    Sample Expiration      06/26/2019,2359 Performed at [facility], [address] [place], [place] [address]   RPR     Status: None   Collection Time: 06/23/19  1:44 AM  Result Value Ref Range   RPR Ser Ql NON REACTIVE NON REACTIVE    Comment: Performed at [facility], [address], [place], [place] [address]  Respiratory Panel by RT PCR (Flu A&B, Covid) - Nasopharyngeal Swab     Status: None   Collection Time: 06/23/19  1:44 AM   Specimen: Nasopharyngeal Swab  Result Value Ref Range   SARS Coronavirus 2 by RT PCR NEGATIVE NEGATIVE    Comment: (NOTE) SARS-CoV-2 target nucleic acids are NOT DETECTED. The SARS-CoV-2 RNA is generally detectable in upper respiratoy specimens during the acute phase of infection. The lowest concentration of SARS-CoV-2 viral copies this assay can detect is 131 copies/mL. A negative result does not preclude SARS-Cov-2 infection and should not be used as the sole basis for treatment or other patient management decisions. A negative result may occur with  improper specimen collection/handling, submission of specimen other than nasopharyngeal swab, presence of viral mutation(s) within the areas targeted by this assay, and inadequate number of viral copies (<131 copies/mL). A negative result must be combined with clinical observations, patient history, and epidemiological information. The expected result is Negative. Fact Sheet for Patients:  PinkCheek.be Fact Sheet for Healthcare Providers:  GravelBags.it This test is not yet ap proved or cleared by the [place] FDA and  has been authorized for detection and/or diagnosis of SARS-CoV-2 by FDA under an Emergency Use Authorization (EUA). This EUA will remain  in effect (meaning this test can be used) for the duration of the COVID-19 declaration under Section 564(b)(1) of the Act, 21 U.S.C. section 360bbb-3(b)(1), unless the authorization is terminated or revoked sooner.  Influenza A by PCR NEGATIVE NEGATIVE   Influenza B by PCR NEGATIVE NEGATIVE    Comment: (NOTE) The Xpert Xpress SARS-CoV-2/FLU/RSV assay is intended as an aid in  the diagnosis of influenza from Nasopharyngeal swab specimens and  should not be used as a sole basis for treatment. Nasal washings and  aspirates are unacceptable for Xpert Xpress SARS-CoV-2/FLU/RSV  testing. Fact Sheet for Patients: https://www.moore.com/ Fact Sheet for Healthcare  Providers: https://www.young.biz/ This test is not yet approved or cleared by the [place] FDA and  has been authorized for detection and/or diagnosis of SARS-CoV-2 by  FDA under an Emergency Use Authorization (EUA). This EUA will remain  in effect (meaning this test can be used) for the duration of the  Covid-19 declaration under Section 564(b)(1) of the Act, 21  U.S.C. section 360bbb-3(b)(1), unless the authorization is  terminated or revoked. Performed at [facility], [address] [place], [place] [address]     Assessment:   34 y.o. [phone] postpartum day # 1  Plan:    1) Acute blood loss anemia - hemodynamically stable and asymptomatic - po ferrous sulfate  2) Blood Type: B POS   3) Rubella 7.86 (09/16 0934) / Varicella Immune/ TDAP not given antepartum   4) Breastfeeding without issue.  5) Contraception, not discussed  6) Disposition- home today.

## 2019-06-26 ENCOUNTER — Encounter: Payer: 59 | Admitting: Obstetrics and Gynecology

## 2019-08-01 ENCOUNTER — Other Ambulatory Visit: Payer: Self-pay

## 2019-08-01 ENCOUNTER — Encounter: Payer: Self-pay | Admitting: Obstetrics & Gynecology

## 2019-08-01 ENCOUNTER — Ambulatory Visit (INDEPENDENT_AMBULATORY_CARE_PROVIDER_SITE_OTHER): Payer: 59 | Admitting: Obstetrics & Gynecology

## 2019-08-01 DIAGNOSIS — Z0000 Encounter for general adult medical examination without abnormal findings: Secondary | ICD-10-CM

## 2019-08-01 NOTE — Progress Notes (Signed)
  OBSTETRICS POSTPARTUM CLINIC PROGRESS NOTE  Subjective:     Brittany Good is a 34 y.o. [phone] female who presents for a postpartum visit. She is 6 weeks postpartum following a Term pregnancy or Pregnancy complicated by: precipitous delivery of face presentation without furthe problems or complications; and delivery by Vaginal, no problems at delivery.  I have fully reviewed the prenatal and intrapartum course. Anesthesia: none.  Postpartum course has been complicated by uncomplicated.  Baby is feeding by Breast.  Bleeding: patient has not  resumed menses.  Bowel function is normal. Bladder function is normal.  Patient is not sexually active. Contraception method desired is vasectomy and this is to be scheduled soon per pt.  Postpartum depression screening: negative. Edinburgh 3.  The following portions of the patient's history were reviewed and updated as appropriate: allergies, current medications, past family history, past medical history, past social history, past surgical history and problem list.  Review of Systems Pertinent items are noted in HPI.  Objective:    BP 120/80   Ht 5\' 8"  (1.727 m)   Wt 163 lb (73.9 kg)   LMP 09/18/2018 (Exact Date)   BMI 24.78 kg/m   General:  alert and no distress   Breasts:  inspection negative, no nipple discharge or bleeding, no masses or nodularity palpable  Lungs: clear to auscultation bilaterally  Heart:  regular rate and rhythm, S1, S2 normal, no murmur, click, rub or gallop  Abdomen: soft, non-tender; bowel sounds normal; no masses,  no organomegaly.     Vulva:  normal  Vagina: normal vagina, no discharge, exudate, lesion, or erythema  Cervix:  no cervical motion tenderness and no lesions  Corpus: normal size, contour, position, consistency, mobility, non-tender  Adnexa:  normal adnexa and no mass, fullness, tenderness  Rectal Exam: Not performed.          Assessment:  Post Partum Care visit 1. Postpartum care and  examination  Plan:  See orders and Patient Instructions Follow up in: 4 months for Annual/PAP or as needed.  No s/sx PPD Plans Vasectomy for contraception    H/o infertility,yet counseled still could have spon ovulation and conception  11/19/2018, MD, [facility], [facility] 08/01/2019  10:13 AM

## 2019-09-05 DIAGNOSIS — H10233 Serous conjunctivitis, except viral, bilateral: Secondary | ICD-10-CM | POA: Diagnosis not present

## 2019-09-10 ENCOUNTER — Telehealth: Payer: Self-pay

## 2019-09-10 NOTE — Addendum Note (Signed)
Addended by: Nadara Mustard on: 09/10/2019 11:24 AM   Modules accepted: Level of Service

## 2019-09-10 NOTE — Telephone Encounter (Signed)
Done.  See if that helps.

## 2019-09-10 NOTE — Telephone Encounter (Signed)
Pt calling; states she needs her postpartum visit coded in such a way that she will get credit for her wellness visit.  Is a [employer] employee and needs credit for wellness exam.  [phone]

## 2019-12-05 DIAGNOSIS — M9901 Segmental and somatic dysfunction of cervical region: Secondary | ICD-10-CM | POA: Diagnosis not present

## 2019-12-05 DIAGNOSIS — M9903 Segmental and somatic dysfunction of lumbar region: Secondary | ICD-10-CM | POA: Diagnosis not present

## 2019-12-05 DIAGNOSIS — M9902 Segmental and somatic dysfunction of thoracic region: Secondary | ICD-10-CM | POA: Diagnosis not present

## 2019-12-05 DIAGNOSIS — M9906 Segmental and somatic dysfunction of lower extremity: Secondary | ICD-10-CM | POA: Diagnosis not present

## 2019-12-06 DIAGNOSIS — M9901 Segmental and somatic dysfunction of cervical region: Secondary | ICD-10-CM | POA: Diagnosis not present

## 2019-12-06 DIAGNOSIS — M9902 Segmental and somatic dysfunction of thoracic region: Secondary | ICD-10-CM | POA: Diagnosis not present

## 2019-12-06 DIAGNOSIS — M9906 Segmental and somatic dysfunction of lower extremity: Secondary | ICD-10-CM | POA: Diagnosis not present

## 2019-12-06 DIAGNOSIS — M9903 Segmental and somatic dysfunction of lumbar region: Secondary | ICD-10-CM | POA: Diagnosis not present

## 2019-12-07 DIAGNOSIS — M9902 Segmental and somatic dysfunction of thoracic region: Secondary | ICD-10-CM | POA: Diagnosis not present

## 2019-12-07 DIAGNOSIS — M9906 Segmental and somatic dysfunction of lower extremity: Secondary | ICD-10-CM | POA: Diagnosis not present

## 2019-12-07 DIAGNOSIS — M9901 Segmental and somatic dysfunction of cervical region: Secondary | ICD-10-CM | POA: Diagnosis not present

## 2019-12-07 DIAGNOSIS — M9903 Segmental and somatic dysfunction of lumbar region: Secondary | ICD-10-CM | POA: Diagnosis not present

## 2019-12-10 ENCOUNTER — Telehealth: Payer: 59 | Admitting: Nurse Practitioner

## 2019-12-10 ENCOUNTER — Encounter: Payer: Self-pay | Admitting: Nurse Practitioner

## 2019-12-10 VITALS — Temp 99.2°F | Ht 68.0 in | Wt 155.0 lb

## 2019-12-10 DIAGNOSIS — M9901 Segmental and somatic dysfunction of cervical region: Secondary | ICD-10-CM | POA: Diagnosis not present

## 2019-12-10 DIAGNOSIS — M9902 Segmental and somatic dysfunction of thoracic region: Secondary | ICD-10-CM | POA: Diagnosis not present

## 2019-12-10 DIAGNOSIS — M545 Low back pain, unspecified: Secondary | ICD-10-CM

## 2019-12-10 DIAGNOSIS — G8929 Other chronic pain: Secondary | ICD-10-CM | POA: Diagnosis not present

## 2019-12-10 DIAGNOSIS — M9903 Segmental and somatic dysfunction of lumbar region: Secondary | ICD-10-CM | POA: Diagnosis not present

## 2019-12-10 DIAGNOSIS — M9906 Segmental and somatic dysfunction of lower extremity: Secondary | ICD-10-CM | POA: Diagnosis not present

## 2019-12-10 NOTE — Patient Instructions (Addendum)
Referral to [facility] for chronic intermittent low back pain without sciatica. Bring your back films to the appt.   Tylenol as per instruction on packaging as needed.   Ice as needed.   Call back as needed.    Chronic Pain, Adult Chronic pain is a type of pain that lasts or keeps coming back (recurs) for at least six months. You may have chronic headaches, abdominal pain, or body pain. Chronic pain may be related to an illness, such as fibromyalgia or complex regional pain syndrome. Sometimes the cause of chronic pain is not known. Chronic pain can make it hard for you to do daily activities. If not treated, chronic pain can lead to other health problems, including anxiety and depression. Treatment depends on the cause and severity of your pain. You may need to work with a pain specialist to come up with a treatment plan. The plan may include medicine, counseling, and physical therapy. Many people benefit from a combination of two or more types of treatment to control their pain. Follow these instructions at home: Lifestyle  Consider keeping a pain diary to share with your health care providers.  Consider talking with a mental health care provider (psychologist) about how to cope with chronic pain.  Consider joining a chronic pain support group.  Try to control or lower your stress levels. Talk to your health care provider about strategies to do this. General instructions   Take over-the-counter and prescription medicines only as told by your health care provider.  Follow your treatment plan as told by your health care provider. This may include: ? Gentle, regular exercise. ? Eating a healthy diet that includes foods such as vegetables, fruits, fish, and lean meats. ? Cognitive or behavioral therapy. ? Working with a Community education officer. ? Meditation or yoga. ? Acupuncture or massage therapy. ? Aroma, color, light, or sound therapy. ? Local electrical  stimulation. ? Shots (injections) of numbing or pain-relieving medicines into the spine or the area of pain.  Check your pain level as told by your health care provider. Ask your health care provider if you should use a pain scale.  Learn as much as you can about how to manage your chronic pain. Ask your health care provider if an intensive pain rehabilitation program or a chronic pain specialist would be helpful.  Keep all follow-up visits as told by your health care provider. This is important. Contact a health care provider if:  Your pain gets worse.  You have new pain.  You have trouble sleeping.  You have trouble doing your normal activities.  Your pain is not controlled with treatment.  Your have side effects from pain medicine.  You feel weak. Get help right away if:  You lose feeling or have numbness in your body.  You lose control of bowel or bladder function.  Your pain suddenly gets much worse.  You develop shaking or chills.  You develop confusion.  You develop chest pain.  You have trouble breathing or shortness of breath.  You pass out.  You have thoughts about hurting yourself or others. This information is not intended to replace advice given to you by your health care provider. Make sure you discuss any questions you have with your health care provider. Document Revised: 02/11/2017 Document Reviewed: 08/19/2015 Elsevier Patient Education  [place].  Managing Pain Without Opioids Opioids are strong medicines used to treat moderate to severe pain. For some people, especially those who have long-term (chronic)  pain, opioids may not be the best choice for pain management due to:  Side effects like nausea, constipation, and sleepiness.  The risk of addiction (opioid use disorder). The longer you take opioids, the greater your risk of addiction. Pain that lasts for more than 3 months is called chronic pain. Managing chronic pain usually  requires more than one approach and is often provided by a team of health care providers working together (multidisciplinary approach). Pain management may be done at a pain management center or pain clinic. Types of pain management without opioids Managing pain without opioids can involve:  Non-opioid medicines.  Exercises to help relieve pain and improve strength and range of motion (physical therapy).  Therapy to help with everyday tasks and activities (occupational therapy).  Therapy to help you find ways to relieve pain by doing things you enjoy (recreational therapy).  Talk therapy (psychotherapy) and other mental health therapies.  Medical treatments such as injections or devices.  Making lifestyle changes. Pain management options Non-opioid medicines Non-opioid medicines for pain may include medicines taken by mouth (oral medicines), such as:  Over-the-counter or prescription NSAIDs. These may be the first medicines used for pain. They work well for muscle and bone pain, and they reduce swelling.  Acetaminophen. This over-the-counter medicine may work well for milder pain but not swelling.  Antidepressants. These may be used to treat chronic pain. A certain type of antidepressant (tricyclics) is often used. These medicines are given in lower doses for pain than when used for depression.  Anticonvulsants. These are usually used to treat seizures but may also reduce nerve (neuropathic) pain.  Muscle relaxants. These relieve pain caused by sudden muscle tightening (spasms). You may also use a type of pain medicine that is applied to the skin as a patch, cream, or gel (topical analgesic), such as a numbing medicine. These may cause fewer side effects than oral medicines. Therapy Physical therapy involves doing exercises to gain strength and flexibility. A physical therapist may teach you exercises to move and stretch parts of your body that are weak, stiff, or painful. You can  learn these exercises at physical therapy visits and practice them at home. Physical therapy may also involve:  Massage.  Heat wraps or applying heat or cold to affected areas.  Sending electrical signals through the skin to interrupt pain signals (transcutaneous electrical nerve stimulation, TENS).  Sending weak lasers through the skin to reduce pain and swelling (low-level laser therapy).  Using signals from your body to help you learn to regulate pain (biofeedback). Occupational therapy helps you learn ways to function at home and work with less pain. Recreational therapy may involve trying new activities or hobbies, such as drawing or a physical activity. Types of mental health therapy for pain include:  Cognitive behavioral therapy (CBT) to help you learn coping skills for dealing with pain.  Acceptance and commitment therapy (ACT) to change the way you think and react to pain.  Relaxation therapies, including muscle relaxation exercises and focusing your mind on the present moment to lower stress (mindfulness-based stress reduction).  Pain management counseling. This may be individual, family, or group counseling.  Medical treatments Medical treatments for pain management include:  Nerve block injections. These may include a pain blocker and anti-inflammatory medicines. You may have injections: ? Near the spine to relieve chronic back or neck pain. ? Into joints to relieve back or joint pain. ? Into nerve areas that supply a painful area to relieve body pain. ? Into  muscles (trigger point injections) to relieve some painful muscle conditions.  A medical device placed near your spine to help block pain signals and relieve nerve pain or chronic back pain (spinal cord stimulation device).  Acupuncture. Follow these instructions at home Medicines  Take over-the-counter and prescription medicines only as told by your health care provider.  If you are taking pain medicine, ask  your health care providers about possible side effects to watch out for.  Do not drive or use heavy machinery while taking prescription pain medicine. Lifestyle   Do not use drugs or alcohol to reduce pain. Limit alcohol intake to no more than 1 drink a day for nonpregnant women and 2 drinks a day for men. One drink equals 12 oz of beer, 5 oz of wine, or 1 oz of hard liquor.  Do not use any products that contain nicotine or tobacco, such as cigarettes and e-cigarettes. These can delay healing. If you need help quitting, ask your health care provider.  Eat a healthy diet and maintain a healthy weight. Poor diet and excess weight may make pain worse. ? Eat foods that are high in fiber. These include fresh fruits and vegetables, whole grains, and beans. ? Limit foods that are high in fat and processed sugars, such as fried and sweet foods.  Exercise regularly. Exercise lowers stress and may help relieve pain. ? Ask your health care provider what activities and exercises are safe for you. ? If your health care provider approves, join an exercise class that combines movement and stress reduction. Examples include yoga and tai chi.  Get enough sleep. Lack of sleep may make pain worse.  Lower stress as much as possible. Practice stress reduction techniques as told by your therapist. General instructions  Work with all your pain management providers to find the treatments that work best for you. You are an important member of your pain management team. There are many things you can do to reduce pain on your own.  Consider joining an online or in-person support group for people who have chronic pain.  Keep all follow-up visits as told by your health care providers. This is important. Where to find more information You can find more information about managing pain without opioids from:  American Academy of Pain Medicine: painmed.[place] for Chronic Pain:  instituteforchronicpain.org  American Chronic Pain Association: theacpa.org Contact a health care provider if:  You have side effects from pain medicine.  Your pain gets worse or does not get better with treatments or home care.  You are struggling with anxiety or depression. Summary  Many types of pain can be managed without opioids. Chronic pain may respond better to pain management without opioids.  Pain is best managed with a team of providers working together.  Pain management without opioids may include non-opioid medicines, medical treatments, physical therapy, mental health therapy, and lifestyle changes.  Tell your health care providers if your pain gets worse or is not being managed well enough. This information is not intended to replace advice given to you by your health care provider. Make sure you discuss any questions you have with your health care provider. Document Revised: 11/22/2018 Document Reviewed: 12/21/2016 Elsevier Patient Education  [place].

## 2019-12-12 DIAGNOSIS — M9902 Segmental and somatic dysfunction of thoracic region: Secondary | ICD-10-CM | POA: Diagnosis not present

## 2019-12-12 DIAGNOSIS — M545 Low back pain, unspecified: Secondary | ICD-10-CM | POA: Insufficient documentation

## 2019-12-12 DIAGNOSIS — M9903 Segmental and somatic dysfunction of lumbar region: Secondary | ICD-10-CM | POA: Diagnosis not present

## 2019-12-12 DIAGNOSIS — M9906 Segmental and somatic dysfunction of lower extremity: Secondary | ICD-10-CM | POA: Diagnosis not present

## 2019-12-12 DIAGNOSIS — M9901 Segmental and somatic dysfunction of cervical region: Secondary | ICD-10-CM | POA: Diagnosis not present

## 2019-12-12 DIAGNOSIS — G8929 Other chronic pain: Secondary | ICD-10-CM | POA: Insufficient documentation

## 2019-12-12 NOTE — Progress Notes (Signed)
Virtual Visit via Virtual Note  This visit type was conducted due to national recommendations for restrictions regarding the COVID-19 pandemic (e.g. social distancing).  This format is felt to be most appropriate for this patient at this time.  All issues noted in this document were discussed and addressed.  No physical exam was performed (except for noted visual exam findings with Video Visits).   I connected with@ on 12/12/19 at  4:00 PM EDT by a video enabled telemedicine application or telephone and verified that I am speaking with the correct person using two identifiers. Location patient: home Location provider: work or home office Persons participating in the virtual visit: patient, husband,  provider  I discussed the limitations, risks, security and privacy concerns of performing an evaluation and management service by telephone and the availability of in person appointments. I also discussed with the patient that there may be a patient responsible charge related to this service. The patient expressed understanding and agreed to proceed.    Reason for visit: Patient has had intermittent pain in the lower back followed by her chiropractor.  She has had for treatment so far with the chiropractor and the back is feeling much better at this time.  HPI: This 34 year old healthy female reports she has had back pain on and off since she was 35 years old. No known injury or trauma.  She has done pretty well with managing this intermittent back pain by strengthening her core, stretching, exercising, keeping her weight normal.  It is located in the lumbar region bilateral , but right side is worse than the left.  This is achy all the time and sometimes she will get a sharp pinching pain, no radiation into her buttock or down her leg. When this pain  occurs, she cannot do a lot, and lays down, applies ice and sees her chiropractor. She recently had another flare of this back pain on Monday and it was so  bad she could not stand up straight. The trigger may be nursing her baby and sitting in a not natural position on soft chair for long periods of time.   She was able to see her chiropractor for a couple adjustments and treatments and is now feeling much better. She saw her chiropractor today, and reports she is about 95% better.  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  Patient reports her chiropractor has done x-rays.  She has not seen orthopedics and there are no x-rays to review in the system.  ROS: See pertinent positives and negatives per HPI.  No flank pain, dysuria, urgency frequency burning.    Past Medical History:  Diagnosis Date  . Acne   . Miscarriage     Past Surgical History:  Procedure Laterality Date  . BREAST ENHANCEMENT SURGERY    . BREAST ENHANCEMENT SURGERY     2014  . DILATION AND CURETTAGE OF UTERUS     2018  . DILATION AND EVACUATION N/A 12/07/2016   Procedure: DILATATION AND EVACUATION;  Surgeon: Conard Novak, MD;  Location: [facility] ORS;  Service: Gynecology;  Laterality: N/A;  . LASIK    . LASIK     2009  . TONSILLECTOMY     1992  . TONSILLECTOMY AND ADENOIDECTOMY      Family History  Problem Relation Age of Onset  . Diabetes Maternal Grandfather   . Cancer Maternal Grandfather   . Cancer Maternal Grandmother        ? type  . Cancer Paternal Grandmother        lung smoker   . Depression Paternal Grandfather     SOCIAL HX: Never smoked   Current Outpatient Medications:  Marland Kitchen  Multiple Vitamins-Minerals (WOMENS MULTIVITAMIN PLUS PO), Take by mouth., Disp: , Rfl:   EXAM:  VITALS per patient if applicable:99.2, wt 155 lbs  GENERAL: alert, oriented, appears well and in no acute distress  HEENT: atraumatic, conjunctiva clear, no obvious abnormalities on inspection of external nose  and ears  NECK: normal movements of the head and neck  LUNGS: on inspection no signs of respiratory distress, breathing rate appears normal, no obvious gross SOB, gasping or wheezing  CV: no obvious cyanosis  MS: moves all visible extremities without noticeable abnormality. No reported back pain today and she is getting up and down out of her chair without difficulty.    PSYCH/NEURO: pleasant and cooperative, no obvious depression or anxiety, speech and thought processing grossly intact  ASSESSMENT AND PLAN:  Discussed the following assessment and plan:  Chronic bilateral low back pain without sciatica - Plan: Ambulatory referral to Orthopedic Surgery  Lactating mother  No problem-specific Assessment & Plan notes found for this encounter.  We discussed chronic causes and triggers of back pain.  She is really doing all the right things and managing her back pain.  I would recommend an opinion by orthopedics and let them take a look at her back films that her chiropractor has done. She does not really want to take much for pain medicine since she is breast-feeding.  She is comfortable taking Tylenol arthritis as needed.  She continue to use ice if her back flares again, and to call back as needed.   I discussed the assessment and treatment plan with the patient. The patient was provided an opportunity to ask questions and all were answered. The patient agreed with the plan and demonstrated an understanding of the instructions.   The patient was advised to call back or seek an in-person evaluation if the symptoms worsen or if the condition fails to improve as anticipated.  Amedeo Kinsman, NP Adult Nurse Practitioner [facility] [employer] [phone]

## 2019-12-14 ENCOUNTER — Other Ambulatory Visit: Payer: Self-pay | Admitting: Orthopedic Surgery

## 2019-12-14 DIAGNOSIS — M9901 Segmental and somatic dysfunction of cervical region: Secondary | ICD-10-CM | POA: Diagnosis not present

## 2019-12-14 DIAGNOSIS — M9903 Segmental and somatic dysfunction of lumbar region: Secondary | ICD-10-CM | POA: Diagnosis not present

## 2019-12-14 DIAGNOSIS — M9902 Segmental and somatic dysfunction of thoracic region: Secondary | ICD-10-CM | POA: Diagnosis not present

## 2019-12-14 DIAGNOSIS — M545 Low back pain, unspecified: Secondary | ICD-10-CM | POA: Diagnosis not present

## 2019-12-14 DIAGNOSIS — M9906 Segmental and somatic dysfunction of lower extremity: Secondary | ICD-10-CM | POA: Diagnosis not present

## 2019-12-18 DIAGNOSIS — M9901 Segmental and somatic dysfunction of cervical region: Secondary | ICD-10-CM | POA: Diagnosis not present

## 2019-12-18 DIAGNOSIS — M9906 Segmental and somatic dysfunction of lower extremity: Secondary | ICD-10-CM | POA: Diagnosis not present

## 2019-12-18 DIAGNOSIS — M9902 Segmental and somatic dysfunction of thoracic region: Secondary | ICD-10-CM | POA: Diagnosis not present

## 2019-12-18 DIAGNOSIS — M9903 Segmental and somatic dysfunction of lumbar region: Secondary | ICD-10-CM | POA: Diagnosis not present

## 2019-12-21 DIAGNOSIS — M9903 Segmental and somatic dysfunction of lumbar region: Secondary | ICD-10-CM | POA: Diagnosis not present

## 2019-12-21 DIAGNOSIS — M9902 Segmental and somatic dysfunction of thoracic region: Secondary | ICD-10-CM | POA: Diagnosis not present

## 2019-12-21 DIAGNOSIS — M9906 Segmental and somatic dysfunction of lower extremity: Secondary | ICD-10-CM | POA: Diagnosis not present

## 2019-12-21 DIAGNOSIS — M9901 Segmental and somatic dysfunction of cervical region: Secondary | ICD-10-CM | POA: Diagnosis not present

## 2019-12-24 DIAGNOSIS — M9901 Segmental and somatic dysfunction of cervical region: Secondary | ICD-10-CM | POA: Diagnosis not present

## 2019-12-24 DIAGNOSIS — M9903 Segmental and somatic dysfunction of lumbar region: Secondary | ICD-10-CM | POA: Diagnosis not present

## 2019-12-24 DIAGNOSIS — M9906 Segmental and somatic dysfunction of lower extremity: Secondary | ICD-10-CM | POA: Diagnosis not present

## 2019-12-24 DIAGNOSIS — M9902 Segmental and somatic dysfunction of thoracic region: Secondary | ICD-10-CM | POA: Diagnosis not present

## 2019-12-26 DIAGNOSIS — M9906 Segmental and somatic dysfunction of lower extremity: Secondary | ICD-10-CM | POA: Diagnosis not present

## 2019-12-26 DIAGNOSIS — M9902 Segmental and somatic dysfunction of thoracic region: Secondary | ICD-10-CM | POA: Diagnosis not present

## 2019-12-26 DIAGNOSIS — M9901 Segmental and somatic dysfunction of cervical region: Secondary | ICD-10-CM | POA: Diagnosis not present

## 2019-12-26 DIAGNOSIS — M9903 Segmental and somatic dysfunction of lumbar region: Secondary | ICD-10-CM | POA: Diagnosis not present

## 2019-12-31 DIAGNOSIS — M545 Low back pain, unspecified: Secondary | ICD-10-CM | POA: Diagnosis not present

## 2020-01-01 DIAGNOSIS — M9906 Segmental and somatic dysfunction of lower extremity: Secondary | ICD-10-CM | POA: Diagnosis not present

## 2020-01-01 DIAGNOSIS — M9901 Segmental and somatic dysfunction of cervical region: Secondary | ICD-10-CM | POA: Diagnosis not present

## 2020-01-01 DIAGNOSIS — M9903 Segmental and somatic dysfunction of lumbar region: Secondary | ICD-10-CM | POA: Diagnosis not present

## 2020-01-01 DIAGNOSIS — M9902 Segmental and somatic dysfunction of thoracic region: Secondary | ICD-10-CM | POA: Diagnosis not present

## 2020-01-02 DIAGNOSIS — M9901 Segmental and somatic dysfunction of cervical region: Secondary | ICD-10-CM | POA: Diagnosis not present

## 2020-01-02 DIAGNOSIS — M9902 Segmental and somatic dysfunction of thoracic region: Secondary | ICD-10-CM | POA: Diagnosis not present

## 2020-01-02 DIAGNOSIS — M9903 Segmental and somatic dysfunction of lumbar region: Secondary | ICD-10-CM | POA: Diagnosis not present

## 2020-01-02 DIAGNOSIS — M9906 Segmental and somatic dysfunction of lower extremity: Secondary | ICD-10-CM | POA: Diagnosis not present

## 2020-01-07 DIAGNOSIS — M9906 Segmental and somatic dysfunction of lower extremity: Secondary | ICD-10-CM | POA: Diagnosis not present

## 2020-01-07 DIAGNOSIS — M9901 Segmental and somatic dysfunction of cervical region: Secondary | ICD-10-CM | POA: Diagnosis not present

## 2020-01-07 DIAGNOSIS — M9902 Segmental and somatic dysfunction of thoracic region: Secondary | ICD-10-CM | POA: Diagnosis not present

## 2020-01-07 DIAGNOSIS — M9903 Segmental and somatic dysfunction of lumbar region: Secondary | ICD-10-CM | POA: Diagnosis not present

## 2020-01-09 DIAGNOSIS — M9902 Segmental and somatic dysfunction of thoracic region: Secondary | ICD-10-CM | POA: Diagnosis not present

## 2020-01-09 DIAGNOSIS — M9906 Segmental and somatic dysfunction of lower extremity: Secondary | ICD-10-CM | POA: Diagnosis not present

## 2020-01-09 DIAGNOSIS — M9903 Segmental and somatic dysfunction of lumbar region: Secondary | ICD-10-CM | POA: Diagnosis not present

## 2020-01-09 DIAGNOSIS — M9901 Segmental and somatic dysfunction of cervical region: Secondary | ICD-10-CM | POA: Diagnosis not present

## 2020-01-14 DIAGNOSIS — M9903 Segmental and somatic dysfunction of lumbar region: Secondary | ICD-10-CM | POA: Diagnosis not present

## 2020-01-14 DIAGNOSIS — M9906 Segmental and somatic dysfunction of lower extremity: Secondary | ICD-10-CM | POA: Diagnosis not present

## 2020-01-14 DIAGNOSIS — M9902 Segmental and somatic dysfunction of thoracic region: Secondary | ICD-10-CM | POA: Diagnosis not present

## 2020-01-14 DIAGNOSIS — M9901 Segmental and somatic dysfunction of cervical region: Secondary | ICD-10-CM | POA: Diagnosis not present

## 2020-01-15 DIAGNOSIS — M545 Low back pain, unspecified: Secondary | ICD-10-CM | POA: Diagnosis not present

## 2020-01-18 DIAGNOSIS — M9901 Segmental and somatic dysfunction of cervical region: Secondary | ICD-10-CM | POA: Diagnosis not present

## 2020-01-18 DIAGNOSIS — M9902 Segmental and somatic dysfunction of thoracic region: Secondary | ICD-10-CM | POA: Diagnosis not present

## 2020-01-18 DIAGNOSIS — M5459 Other low back pain: Secondary | ICD-10-CM | POA: Diagnosis not present

## 2020-01-18 DIAGNOSIS — M9903 Segmental and somatic dysfunction of lumbar region: Secondary | ICD-10-CM | POA: Diagnosis not present

## 2020-01-21 ENCOUNTER — Other Ambulatory Visit: Payer: Self-pay

## 2020-01-21 ENCOUNTER — Ambulatory Visit (INDEPENDENT_AMBULATORY_CARE_PROVIDER_SITE_OTHER): Payer: 59 | Admitting: Dermatology

## 2020-01-21 DIAGNOSIS — L578 Other skin changes due to chronic exposure to nonionizing radiation: Secondary | ICD-10-CM

## 2020-01-21 DIAGNOSIS — L821 Other seborrheic keratosis: Secondary | ICD-10-CM | POA: Diagnosis not present

## 2020-01-21 DIAGNOSIS — L820 Inflamed seborrheic keratosis: Secondary | ICD-10-CM

## 2020-01-21 NOTE — Progress Notes (Signed)
   New Patient Visit  Subjective  Brittany Good is a 34 y.o. female who presents for the following: Skin Problem (New pt present for a spot check today on her chest and back ). Pt report moles darker since she had a baby 6 months ago,   Pt breast feed 10 month old baby   The following portions of the chart were reviewed this encounter and updated as appropriate:  Tobacco  Allergies  Meds  Problems  Med Hx  Surg Hx  Fam Hx     Review of Systems:  No other skin or systemic complaints except as noted in HPI or Assessment and Plan.  Objective  Well appearing patient in no apparent distress; mood and affect are within normal limits.  A focused examination was performed including face,chest,back . Relevant physical exam findings are noted in the Assessment and Plan.  Objective  R breast inferior edge of areola (2): Erythematous keratotic or waxy stuck-on papule or plaque.    Assessment & Plan  Inflamed seborrheic keratosis (2) changing. If not resolved at follow up, consider biopsy R breast inferior edge of areola  Destruction of lesion - R breast inferior edge of areola Complexity: simple   Destruction method: cryotherapy   Informed consent: discussed and consent obtained   Timeout:  patient name, date of birth, surgical site, and procedure verified Lesion destroyed using liquid nitrogen: Yes   Region frozen until ice ball extended beyond lesion: Yes   Outcome: patient tolerated procedure well with no complications   Post-procedure details: wound care instructions given     Actinic Damage - chronic, secondary to cumulative UV radiation exposure/sun exposure over time - diffuse scaly erythematous macules with underlying dyspigmentation - Recommend daily broad spectrum sunscreen SPF 30+ to sun-exposed areas, reapply every 2 hours as needed.  - Call for new or changing lesions.  Seborrheic Keratoses - Stuck-on, waxy, tan-brown papules and plaques  - Discussed benign  etiology and prognosis. - Observe - Call for any changes  Return in about 3 months (around 04/22/2020).  IAngelique Holm, CMA, am acting as scribe for Armida Sans, MD .  Documentation: I have reviewed the above documentation for accuracy and completeness, and I agree with the above.  Armida Sans, MD

## 2020-01-21 NOTE — Patient Instructions (Addendum)
Cryotherapy Aftercare  . Wash gently with soap and water everyday.   . Apply Vaseline and Band-Aid daily until healed.  

## 2020-01-22 ENCOUNTER — Encounter: Payer: Self-pay | Admitting: Dermatology

## 2020-01-22 DIAGNOSIS — M9902 Segmental and somatic dysfunction of thoracic region: Secondary | ICD-10-CM | POA: Diagnosis not present

## 2020-01-22 DIAGNOSIS — M9903 Segmental and somatic dysfunction of lumbar region: Secondary | ICD-10-CM | POA: Diagnosis not present

## 2020-01-22 DIAGNOSIS — M5459 Other low back pain: Secondary | ICD-10-CM | POA: Diagnosis not present

## 2020-01-22 DIAGNOSIS — M9901 Segmental and somatic dysfunction of cervical region: Secondary | ICD-10-CM | POA: Diagnosis not present

## 2020-01-29 DIAGNOSIS — M9901 Segmental and somatic dysfunction of cervical region: Secondary | ICD-10-CM | POA: Diagnosis not present

## 2020-01-29 DIAGNOSIS — M9902 Segmental and somatic dysfunction of thoracic region: Secondary | ICD-10-CM | POA: Diagnosis not present

## 2020-01-29 DIAGNOSIS — M5459 Other low back pain: Secondary | ICD-10-CM | POA: Diagnosis not present

## 2020-01-29 DIAGNOSIS — M9903 Segmental and somatic dysfunction of lumbar region: Secondary | ICD-10-CM | POA: Diagnosis not present

## 2020-02-07 DIAGNOSIS — M9902 Segmental and somatic dysfunction of thoracic region: Secondary | ICD-10-CM | POA: Diagnosis not present

## 2020-02-07 DIAGNOSIS — M5459 Other low back pain: Secondary | ICD-10-CM | POA: Diagnosis not present

## 2020-02-07 DIAGNOSIS — M9901 Segmental and somatic dysfunction of cervical region: Secondary | ICD-10-CM | POA: Diagnosis not present

## 2020-02-07 DIAGNOSIS — M9903 Segmental and somatic dysfunction of lumbar region: Secondary | ICD-10-CM | POA: Diagnosis not present

## 2020-05-06 ENCOUNTER — Ambulatory Visit: Payer: 59 | Admitting: Dermatology

## 2020-06-12 ENCOUNTER — Ambulatory Visit: Payer: 59 | Admitting: Family Medicine

## 2020-07-01 ENCOUNTER — Telehealth: Payer: Self-pay

## 2020-07-01 NOTE — Telephone Encounter (Signed)
Patient is scheduled for 4/21 at 3:30 with Jobie Quaker for possible Mirena placement

## 2020-07-03 ENCOUNTER — Encounter: Payer: Self-pay | Admitting: Obstetrics and Gynecology

## 2020-07-03 ENCOUNTER — Other Ambulatory Visit: Payer: Self-pay

## 2020-07-03 ENCOUNTER — Ambulatory Visit (INDEPENDENT_AMBULATORY_CARE_PROVIDER_SITE_OTHER): Payer: 59 | Admitting: Obstetrics and Gynecology

## 2020-07-03 VITALS — BP 120/78 | Ht 68.0 in | Wt 161.0 lb

## 2020-07-03 DIAGNOSIS — Z975 Presence of (intrauterine) contraceptive device: Secondary | ICD-10-CM

## 2020-07-03 DIAGNOSIS — Z30430 Encounter for insertion of intrauterine contraceptive device: Secondary | ICD-10-CM

## 2020-07-03 LAB — POCT URINE PREGNANCY: Preg Test, Ur: NEGATIVE

## 2020-07-03 MED ORDER — LEVONORGESTREL 20 MCG/24HR IU IUD: 1.0000 | INTRAUTERINE_SYSTEM | Freq: Once | INTRAUTERINE | 0 refills | Status: DC

## 2020-07-03 NOTE — Progress Notes (Signed)
  IUD PROCEDURE NOTE:  Brittany Good is a 35 y.o. [phone] here for IUD insertion. No GYN concerns.  Last pap smear was normal.  IUD Insertion Procedure Note Patient identified, informed consent performed, consent signed.   Discussed risks of irregular bleeding, cramping, infection, malpositioning or misplacement of the IUD outside the uterus which may require further procedure such as laparoscopy, risk of failure <1%. Time out was performed.  Urine pregnancy test negative.  A bimanual exam showed the uterus to be retroverted.  Speculum placed in the vagina.  Cervix visualized.  Cleaned with Betadine x 2.  Grasped anteriorly with a single tooth tenaculum.  Uterus sounded to 7 cm.   Mirena IUD placed per manufacturer's recommendations.  Strings trimmed to 3 cm. Tenaculum was removed, good hemostasis noted.  Patient tolerated procedure well.   Patient was given post-procedure instructions.  She was advised to have backup contraception for one week.  Patient was also asked to check IUD strings periodically and follow up in 4 weeks for IUD check.  [name], CNM, MSN [facility], [facility] 07/03/2020  4:01 PM

## 2020-07-07 NOTE — Telephone Encounter (Signed)
Mirena rcvd/charged 07/03/20

## 2020-07-26 IMAGING — US US EXTREM LOW VENOUS*L*
1 series · 13 of 24 positions shown · non-contrast
Comparison: None

CLINICAL DATA: Thirty-six weeks pregnant, swelling LEFT lower
extremity question deep venous thrombosis

EXAM:
LEFT LOWER EXTREMITY VENOUS DOPPLER ULTRASOUND
TECHNIQUE: Gray-scale sonography with compression, as well as color and duplex
ultrasound, were performed to evaluate the deep venous system(s)
from the level of the common femoral vein through the popliteal and
proximal calf veins.

[Series 1: us venous img lower uni left (dvt) · portal-venous · 13 of 34 slices shown]
[im 1/34]
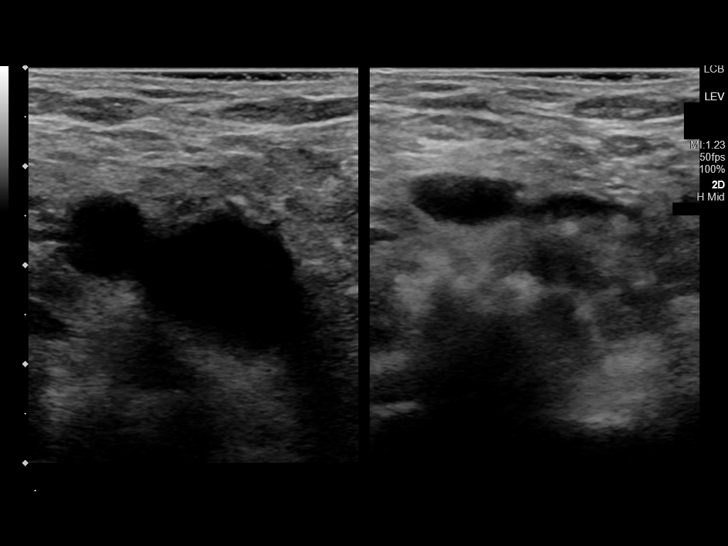
[im 3/34]
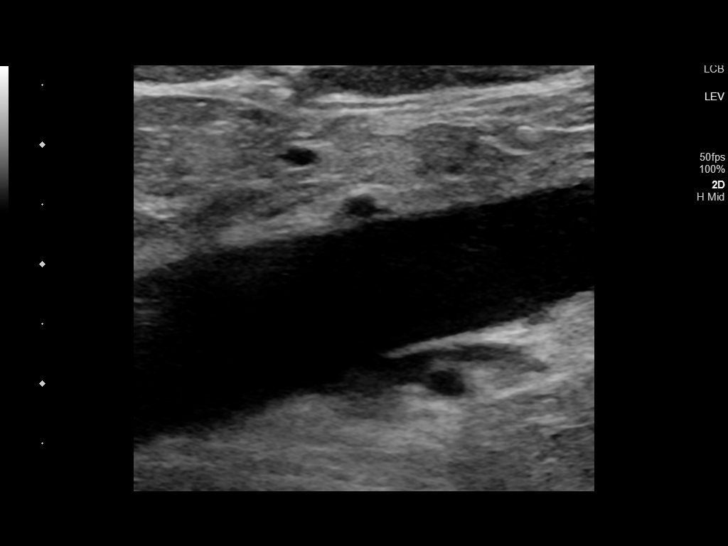
[im 6/34]
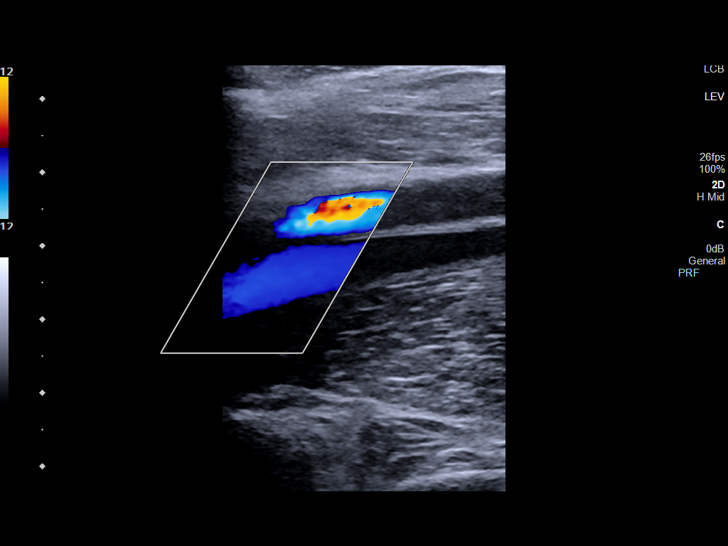
[im 9/34]
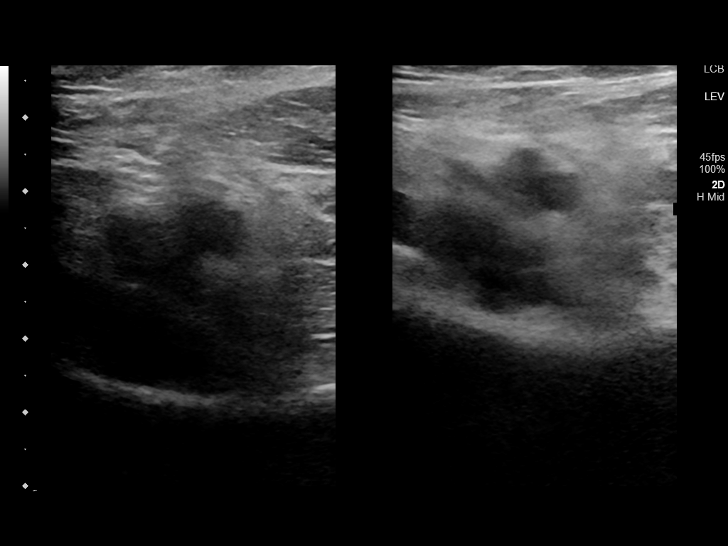
[im 12/34]
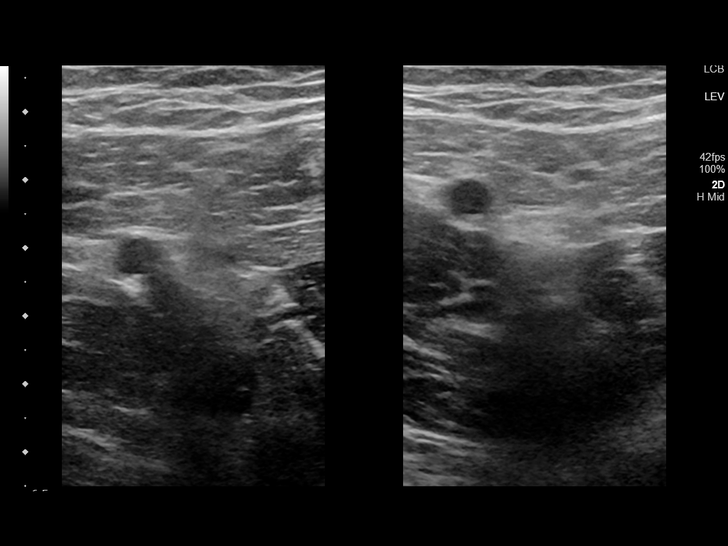
[im 15/34]
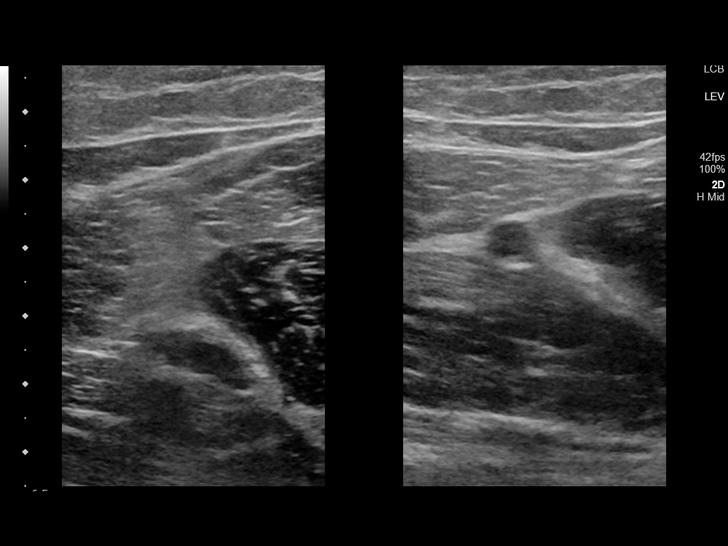
[im 18/34]
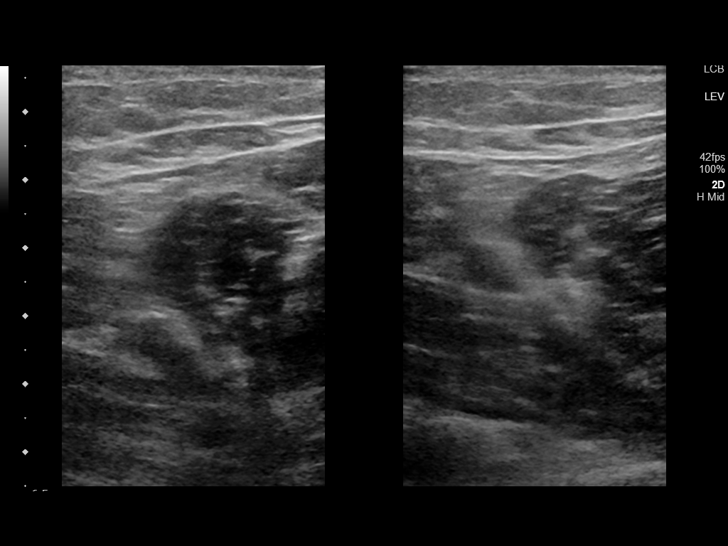
[im 19/34]
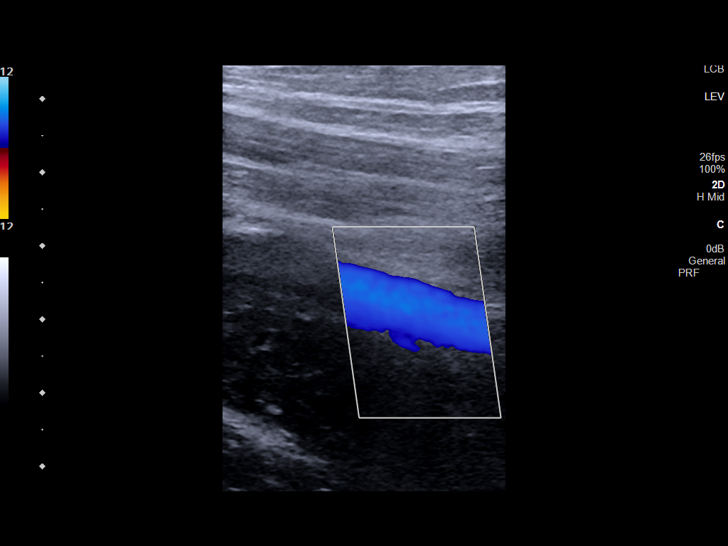
[im 22/34]
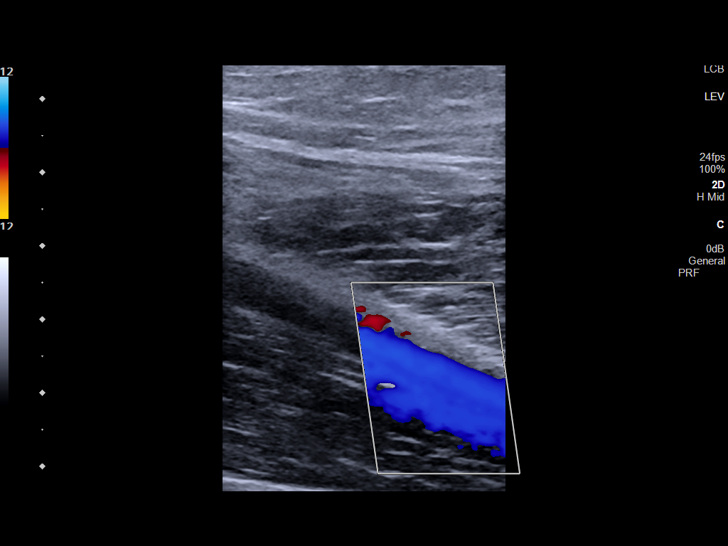
[im 25/34]
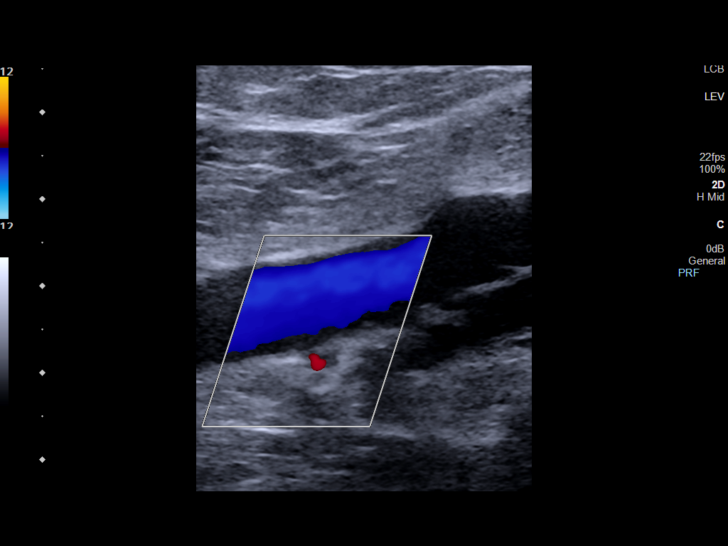
[im 28/34]
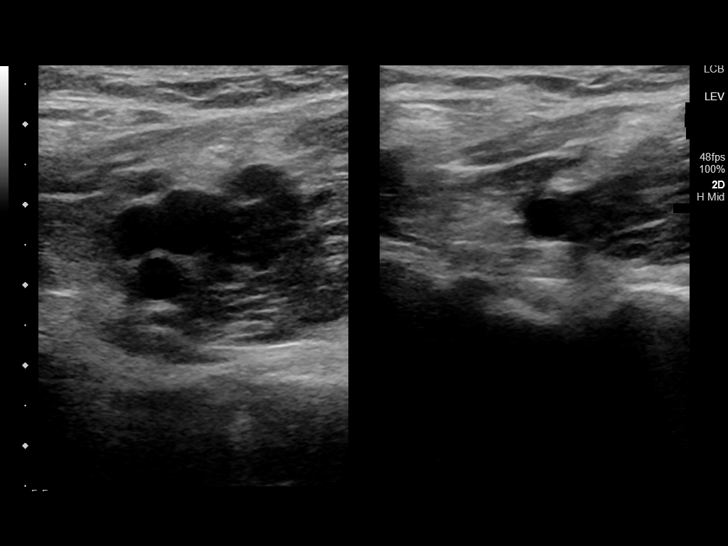
[im 31/34]
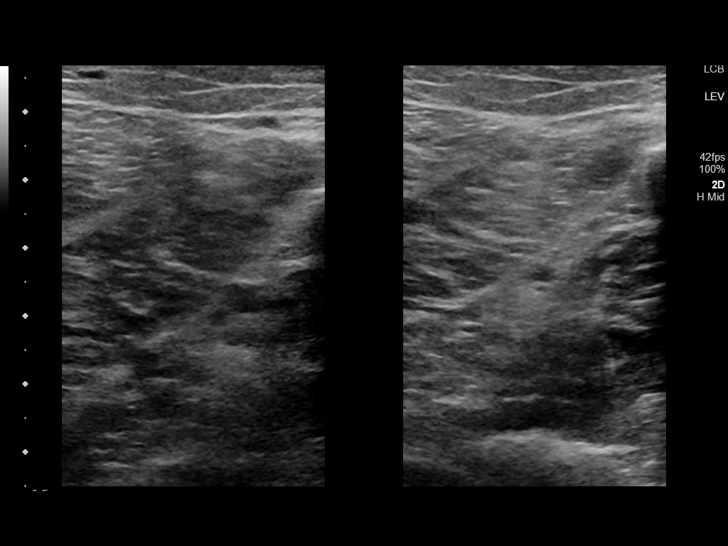
[im 34/34]
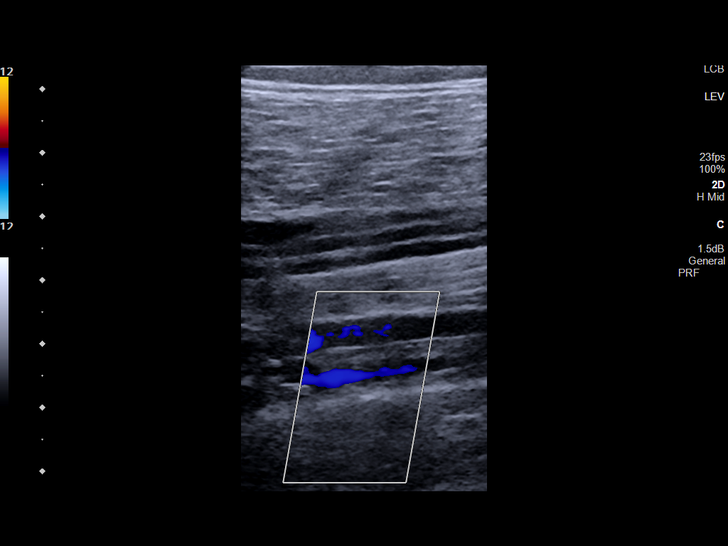

[13 of 24 positions shown; findings below may reference images not displayed]

FINDINGS: VENOUS

Normal compressibility of the common femoral, superficial femoral,
and popliteal veins, as well as the visualized calf veins.
Visualized portions of profunda femoral vein and great saphenous
vein unremarkable. No filling defects to suggest DVT on grayscale or
color Doppler imaging. Doppler waveforms show normal direction of
venous flow, normal respiratory phasicity and response to
augmentation.

Limited views of the contralateral common femoral vein are
unremarkable.

OTHER

None.

Limitations: none
IMPRESSION: No femoropopliteal DVT nor evidence of DVT within the visualized
calf veins of the LEFT lower extremity.

If clinical symptoms are inconsistent or if there are persistent or
worsening symptoms, further imaging (possibly involving the iliac
veins) may be warranted.

## 2020-07-31 ENCOUNTER — Ambulatory Visit: Payer: 59 | Admitting: Obstetrics and Gynecology

## 2020-08-29 DIAGNOSIS — H5213 Myopia, bilateral: Secondary | ICD-10-CM | POA: Diagnosis not present

## 2020-08-29 DIAGNOSIS — H52223 Regular astigmatism, bilateral: Secondary | ICD-10-CM | POA: Diagnosis not present

## 2020-08-29 DIAGNOSIS — H524 Presbyopia: Secondary | ICD-10-CM | POA: Diagnosis not present

## 2020-10-07 ENCOUNTER — Other Ambulatory Visit: Payer: Self-pay

## 2020-10-07 MED ORDER — CARESTART COVID-19 HOME TEST VI KIT
PACK | 0 refills | Status: DC
  Filled 2020-10-07: qty 2, 4d supply, fill #0

## 2020-10-23 ENCOUNTER — Other Ambulatory Visit: Payer: Self-pay

## 2020-10-23 MED ORDER — CARESTART COVID-19 HOME TEST VI KIT
PACK | 0 refills | Status: DC
  Filled 2020-10-23: qty 2, 4d supply, fill #0

## 2020-10-27 ENCOUNTER — Other Ambulatory Visit: Payer: Self-pay

## 2020-10-27 MED ORDER — CARESTART COVID-19 HOME TEST VI KIT
PACK | 0 refills | Status: DC
  Filled 2020-10-27 – 2020-10-28 (×2): qty 2, 4d supply, fill #0
  Filled 2020-10-28: qty 1, 1d supply, fill #0

## 2020-10-28 ENCOUNTER — Other Ambulatory Visit: Payer: Self-pay

## 2020-11-13 ENCOUNTER — Other Ambulatory Visit: Payer: Self-pay

## 2020-11-13 ENCOUNTER — Ambulatory Visit (INDEPENDENT_AMBULATORY_CARE_PROVIDER_SITE_OTHER): Payer: 59 | Admitting: Dermatology

## 2020-11-13 DIAGNOSIS — L578 Other skin changes due to chronic exposure to nonionizing radiation: Secondary | ICD-10-CM | POA: Diagnosis not present

## 2020-11-13 DIAGNOSIS — L820 Inflamed seborrheic keratosis: Secondary | ICD-10-CM

## 2020-11-13 DIAGNOSIS — L821 Other seborrheic keratosis: Secondary | ICD-10-CM

## 2020-11-13 NOTE — Patient Instructions (Signed)
Cryotherapy Aftercare  . Wash gently with soap and water everyday.   . Apply Vaseline and Band-Aid daily until healed.  

## 2020-11-13 NOTE — Progress Notes (Signed)
   Follow-Up Visit   Subjective  Brittany Good is a 35 y.o. female who presents for the following: Spot Check (Pt c/o of itchy and irritating spot on her right neck that she would like checked today. ).  Pt has other spots to be evaluated.  The following portions of the chart were reviewed this encounter and updated as appropriate:  Tobacco  Allergies  Meds  Problems  Med Hx  Surg Hx  Fam Hx     Review of Systems: No other skin or systemic complaints except as noted in HPI or Assessment and Plan.  Objective  Well appearing patient in no apparent distress; mood and affect are within normal limits.  A focused examination was performed including neck. Relevant physical exam findings are noted in the Assessment and Plan.  right neck x 3 (3) Erythematous keratotic or waxy stuck-on papule or plaque.   Assessment & Plan  Inflamed seborrheic keratosis right neck x 3  Prior to procedure, discussed risks of blister formation, small wound, skin dyspigmentation, or rare scar following cryotherapy. Recommend Vaseline ointment to treated areas while healing.   Destruction of lesion - right neck x 3 Complexity: simple   Destruction method: cryotherapy   Informed consent: discussed and consent obtained   Timeout:  patient name, date of birth, surgical site, and procedure verified Lesion destroyed using liquid nitrogen: Yes   Region frozen until ice ball extended beyond lesion: Yes   Outcome: patient tolerated procedure well with no complications   Post-procedure details: wound care instructions given    Actinic Damage - chronic, secondary to cumulative UV radiation exposure/sun exposure over time - diffuse scaly erythematous macules with underlying dyspigmentation - Recommend daily broad spectrum sunscreen SPF 30+ to sun-exposed areas, reapply every 2 hours as needed.  - Recommend staying in the shade or wearing long sleeves, sun glasses (UVA+UVB protection) and wide brim hats  (4-inch brim around the entire circumference of the hat). - Call for new or changing lesions.  Seborrheic Keratoses - Stuck-on, waxy, tan-brown papules and/or plaques  - Benign-appearing - Discussed benign etiology and prognosis. - Observe - Call for any changes  Return if symptoms worsen or fail to improve.  IEpifania Gore, CMA, am acting as scribe for Armida Sans, MD.  Documentation: I have reviewed the above documentation for accuracy and completeness, and I agree with the above.  Armida Sans, MD

## 2020-11-20 ENCOUNTER — Encounter: Payer: Self-pay | Admitting: Dermatology

## 2020-12-25 ENCOUNTER — Telehealth: Payer: 59 | Admitting: Physician Assistant

## 2020-12-25 ENCOUNTER — Other Ambulatory Visit: Payer: Self-pay

## 2020-12-25 DIAGNOSIS — M545 Low back pain, unspecified: Secondary | ICD-10-CM

## 2020-12-25 MED ORDER — CYCLOBENZAPRINE HCL 10 MG PO TABS
10.0000 mg | ORAL_TABLET | Freq: Three times a day (TID) | ORAL | 0 refills | Status: DC | PRN
  Filled 2020-12-25: qty 15, 5d supply, fill #0

## 2020-12-25 MED ORDER — NAPROXEN 500 MG PO TABS
500.0000 mg | ORAL_TABLET | Freq: Two times a day (BID) | ORAL | 0 refills | Status: DC
  Filled 2020-12-25: qty 20, 10d supply, fill #0

## 2020-12-25 NOTE — Progress Notes (Signed)
I have spent 5 minutes in review of e-visit questionnaire, review and updating patient chart, medical decision making and response to patient.   Montarius Kitagawa Cody Naveed Humphres, PA-C    

## 2020-12-25 NOTE — Progress Notes (Signed)

## 2021-01-15 ENCOUNTER — Other Ambulatory Visit: Payer: Self-pay

## 2021-01-15 MED ORDER — CARESTART COVID-19 HOME TEST VI KIT
PACK | 0 refills | Status: DC
  Filled 2021-01-15: qty 2, 4d supply, fill #0

## 2021-01-20 ENCOUNTER — Ambulatory Visit: Payer: 59

## 2021-02-11 DIAGNOSIS — Z23 Encounter for immunization: Secondary | ICD-10-CM | POA: Diagnosis not present

## 2021-05-11 ENCOUNTER — Other Ambulatory Visit: Payer: Self-pay

## 2021-05-11 ENCOUNTER — Ambulatory Visit (INDEPENDENT_AMBULATORY_CARE_PROVIDER_SITE_OTHER): Payer: No Typology Code available for payment source | Admitting: Family

## 2021-05-11 ENCOUNTER — Encounter: Payer: Self-pay | Admitting: Family

## 2021-05-11 ENCOUNTER — Ambulatory Visit (INDEPENDENT_AMBULATORY_CARE_PROVIDER_SITE_OTHER)
Admission: RE | Admit: 2021-05-11 | Discharge: 2021-05-11 | Disposition: A | Discharge status: Home / Self Care | Payer: No Typology Code available for payment source | Source: Ambulatory Visit | Attending: Family | Admitting: Family

## 2021-05-11 VITALS — BP 104/64 | HR 74 | Ht 68.0 in | Wt 167.0 lb

## 2021-05-11 DIAGNOSIS — G8929 Other chronic pain: Secondary | ICD-10-CM | POA: Diagnosis not present

## 2021-05-11 DIAGNOSIS — M545 Low back pain, unspecified: Secondary | ICD-10-CM | POA: Diagnosis not present

## 2021-05-11 DIAGNOSIS — Z13220 Encounter for screening for lipoid disorders: Secondary | ICD-10-CM | POA: Diagnosis not present

## 2021-05-11 DIAGNOSIS — Z0001 Encounter for general adult medical examination with abnormal findings: Secondary | ICD-10-CM | POA: Diagnosis not present

## 2021-05-11 DIAGNOSIS — D509 Iron deficiency anemia, unspecified: Secondary | ICD-10-CM

## 2021-05-11 NOTE — Progress Notes (Signed)
New Patient Office Visit  Subjective:  Patient ID: Brittany Good, female    DOB: 07-24-1985  Age: 36 y.o. MRN: [id number]  CC:  Chief Complaint  Patient presents with   Establish Care    Pt stated--lower back pain comes and go for long term.    HPI Brittany Good is here to establish care as a new patient.  Prior provider was: Karlyn Agee Scocuzza Pt is without acute concerns.   chronic concerns:  Low back pain, has been slowly improving but has flare ups. Chronic has been ongoing since age 67, no known injury or trauma. Xray in the past on maternity leave about two years ago with chiropractor. Did see [facility] in the past, recommend physical therapy, did one session and didn't continue on. Comes and goes, so forgets about it when it occurs. Seems to be becoming more frequent, last episode October/January and then just now last Monday. When symptoms occur in bil midline lower back ,right side prominent. No in the lower buttocks. No radiation of pain down the leg. Not sure if links up with menses, but did have menses on the 18th when it did occur. Does take muscle relaxer , flexeril, with relief but makes her sleepy. Ibuprofen doesn't help but naproxen does. Beginning to improve in the last few days with naproxen here and there.   Does work as a Futures trader, sits often / Nature conservation officer.   No urinary frequency , urgency or flank pain.   Pap: last pap 11/02/18, has appt April 2023 for repeat pap, felicia dickerson/ midwife and dr Leafy Ro.   Past Medical History:  Diagnosis Date   Acne    Depression    Miscarriage     Past Surgical History:  Procedure Laterality Date   BREAST ENHANCEMENT SURGERY     BREAST ENHANCEMENT SURGERY     2014   DILATION AND CURETTAGE OF UTERUS     2018   DILATION AND EVACUATION N/A 12/07/2016   Procedure: DILATATION AND EVACUATION;  Surgeon: Will Bonnet, MD;  Location: [facility] ORS;  Service: Gynecology;  Laterality: N/A;    LASIK     LASIK     2009   TONSILLECTOMY     1992   TONSILLECTOMY AND ADENOIDECTOMY      Family History  Problem Relation Age of Onset   Diabetes Maternal Grandfather    Cancer Maternal Grandfather    Cancer Maternal Grandmother        ? type   Cancer Paternal Grandmother        lung smoker    Depression Paternal Grandfather     Social History   Socioeconomic History   Marital status: Married    Spouse name: Not on file   Number of children: 3   Years of education: Not on file   Highest education level: Not on file  Occupational History   Occupation: labor delivery nurse, RN  Tobacco Use   Smoking status: Never   Smokeless tobacco: Never  Vaping Use   Vaping Use: Never used  Substance and Sexual Activity   Alcohol use: Yes    Comment: , occasionally   Drug use: No   Sexual activity: Yes    Birth control/protection: I.U.D.  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  Wears seat belt, no guns, safe in relationship    Moved from [facility] in [place] to [place]   Social Determinants of Health   Financial Resource Strain: Not on file  Food Insecurity: Not on file  Transportation Needs: Not on file  Physical Activity: Not on file  Stress: Not on file  Social Connections: Not on file  Intimate Partner Violence: Not on file    Outpatient Medications Prior to Visit  Medication Sig Dispense Refill   cyclobenzaprine (FLEXERIL) 10 MG tablet Take 1 tablet (10 mg total) by mouth 3 (three) times daily as needed for muscle spasms. 15 tablet 0   levonorgestrel (MIRENA) 20 MCG/24HR IUD 1 Intra Uterine Device (1 each total) by Intrauterine route once for 1 dose. 1 each 0   Multiple Vitamins-Minerals (WOMENS MULTIVITAMIN PLUS PO) Take by mouth.     naproxen (NAPROSYN) 500 MG tablet Take 1 tablet (500 mg total) by mouth 2 (two) times daily with a meal. 20 tablet 0   COVID-19  At Home Antigen Test (CARESTART COVID-19 HOME TEST) KIT use as directed 2 kit 0   COVID-19 At Home Antigen Test (CARESTART COVID-19 HOME TEST) KIT use as directed 2 kit 0   No facility-administered medications prior to visit.    No Known Allergies  ROS Review of Systems  Constitutional:  Negative for chills, fatigue and fever.  Respiratory:  Negative for cough and shortness of breath.   Cardiovascular:  Negative for chest pain and palpitations.  Genitourinary:  Negative for difficulty urinating, dysuria, flank pain, frequency, pelvic pain and vaginal bleeding.  Musculoskeletal:  Positive for back pain (right mid to lower back no radiation of pain).  Psychiatric/Behavioral:  Negative for confusion, sleep disturbance and suicidal ideas. The patient is not nervous/anxious.        Objective:    Physical Exam Constitutional:      General: She is not in acute distress.    Appearance: Normal appearance. She is normal weight. She is not ill-appearing, toxic-appearing or diaphoretic.  HENT:     Head: Normocephalic.  Cardiovascular:     Rate and Rhythm: Normal rate and regular rhythm.  Pulmonary:     Effort: Pulmonary effort is normal.  Musculoskeletal:     Lumbar back: Decreased range of motion (painful rom with rotation left to right and flexion).       Back:  Skin:    General: Skin is warm.  Neurological:     General: No focal deficit present.     Mental Status: She is alert and oriented to person, place, and time.     BP 104/64    Pulse 74    Ht 5' 8"  (1.727 m)    Wt 167 lb (75.8 kg)    LMP 05/02/2021    SpO2 96%    BMI 25.39 kg/m  Wt Readings from Last 3 Encounters:  05/11/21 167 lb (75.8 kg)  07/03/20 161 lb (73 kg)  12/10/19 155 lb (70.3 kg)     There are no preventive care reminders to display for this patient.   There are no preventive care reminders to display for this patient.  Lab Results  Component Value Date   TSH 1.930 07/28/2017   Lab Results   Component Value Date   WBC 6.3 05/11/2021   HGB 12.6 05/11/2021   HCT 38.3 05/11/2021   MCV 92.0 05/11/2021   PLT 240.0 05/11/2021   Lab Results  Component Value Date   NA 139 05/11/2021  K 3.7 05/11/2021   CO2 30 05/11/2021   GLUCOSE 81 05/11/2021   BUN 12 05/11/2021   CREATININE 0.79 05/11/2021   BILITOT 0.5 05/11/2021   ALKPHOS 28 (L) 05/11/2021   AST 15 05/11/2021   ALT 10 05/11/2021   PROT 7.3 05/11/2021   ALBUMIN 4.6 05/11/2021   CALCIUM 9.7 05/11/2021   GFR 95.37 05/11/2021   Lab Results  Component Value Date   CHOL 158 05/11/2021   Lab Results  Component Value Date   HDL 67.40 05/11/2021   Lab Results  Component Value Date   LDLCALC 77 05/11/2021   Lab Results  Component Value Date   TRIG 66.0 05/11/2021   Lab Results  Component Value Date   CHOLHDL 2 05/11/2021   No results found for: HGBA1C    Assessment & Plan:   Problem List Items Addressed This Visit       Other   Iron deficiency anemia - Primary    Cbc today pending results      Relevant Orders   CBC with Differential (Completed)   Chronic bilateral low back pain without sciatica    Dg lumbar spine pending Heat/ice prn Flexeril as needed Chronic, referral placed for neuosurgery as also increase of frequency exacerbation/episodes      Relevant Orders   DG Lumbar Spine Complete (Completed)   Ambulatory referral to Neurosurgery   Comprehensive metabolic panel (Completed)   Encounter for general adult medical examination with abnormal findings    Preventative care handout given to pt  Healthcare screenings reviewed as applicable to pt Pt advised on safe sex, wearing seatbelts in car, and proper nutrition labwork ordered today for annual      Relevant Orders   CBC with Differential (Completed)   Comprehensive metabolic panel (Completed)   Lipid panel (Completed)   Screening for lipoid disorders    Lipid panel ordered pending results      Relevant Orders   Lipid panel  (Completed)    No orders of the defined types were placed in this encounter.   Follow-up: No follow-ups on file.    Eugenia Pancoast, FNP

## 2021-05-11 NOTE — Patient Instructions (Addendum)
Complete xray(s) prior to leaving today. I will notify you of your results once received. ° °Stop by the lab prior to leaving today. I will notify you of your results once received.  ° °It was a pleasure seeing you today! Please do not hesitate to reach out with any questions and or concerns. ° °Regards,  ° °Talyn Dessert °FNP-C ° ° °

## 2021-05-12 ENCOUNTER — Other Ambulatory Visit: Payer: Self-pay | Admitting: Family

## 2021-05-12 DIAGNOSIS — D509 Iron deficiency anemia, unspecified: Secondary | ICD-10-CM

## 2021-05-12 LAB — CBC WITH DIFFERENTIAL/PLATELET
Basophils Absolute: 0.1 10*3/uL (ref 0.0–0.1)
Basophils Relative: 1 % (ref 0.0–3.0)
Eosinophils Absolute: 0.1 10*3/uL (ref 0.0–0.7)
Eosinophils Relative: 2.3 % (ref 0.0–5.0)
HCT: 38.3 % (ref 36.0–46.0)
Hemoglobin: 12.6 g/dL (ref 12.0–15.0)
Lymphocytes Relative: 32.6 % (ref 12.0–46.0)
Lymphs Abs: 2 10*3/uL (ref 0.7–4.0)
MCHC: 32.9 g/dL (ref 30.0–36.0)
MCV: 92 fl (ref 78.0–100.0)
Monocytes Absolute: 0.4 10*3/uL (ref 0.1–1.0)
Monocytes Relative: 7.1 % (ref 3.0–12.0)
Neutro Abs: 3.6 10*3/uL (ref 1.4–7.7)
Neutrophils Relative %: 57 % (ref 43.0–77.0)
Platelets: 240 10*3/uL (ref 150.0–400.0)
RBC: 4.17 Mil/uL (ref 3.87–5.11)
RDW: 14.8 % (ref 11.5–15.5)
WBC: 6.3 10*3/uL (ref 4.0–10.5)

## 2021-05-12 LAB — COMPREHENSIVE METABOLIC PANEL
ALT: 10 U/L (ref 0–35)
AST: 15 U/L (ref 0–37)
Albumin: 4.6 g/dL (ref 3.5–5.2)
Alkaline Phosphatase: 28 U/L — ABNORMAL LOW (ref 39–117)
BUN: 12 mg/dL (ref 6–23)
CO2: 30 mEq/L (ref 19–32)
Calcium: 9.7 mg/dL (ref 8.4–10.5)
Chloride: 102 mEq/L (ref 96–112)
Creatinine, Ser: 0.79 mg/dL (ref 0.40–1.20)
GFR: 95.37 mL/min (ref >60.00)
Glucose, Bld: 81 mg/dL (ref 70–99)
Potassium: 3.7 mEq/L (ref 3.5–5.1)
Sodium: 139 mEq/L (ref 135–145)
Total Bilirubin: 0.5 mg/dL (ref 0.2–1.2)
Total Protein: 7.3 g/dL (ref 6.0–8.3)

## 2021-05-12 LAB — LIPID PANEL
Cholesterol: 158 mg/dL (ref 0–200)
HDL: 67.4 mg/dL (ref >39.00)
LDL Cholesterol: 77 mg/dL (ref 0–99)
NonHDL: 90.26
Total CHOL/HDL Ratio: 2
Triglycerides: 66 mg/dL (ref 0.0–149.0)
VLDL: 13.2 mg/dL (ref 0.0–40.0)

## 2021-05-12 NOTE — Progress Notes (Signed)
Can we add IBC + ferritin? I will add the order.

## 2021-05-13 ENCOUNTER — Other Ambulatory Visit (INDEPENDENT_AMBULATORY_CARE_PROVIDER_SITE_OTHER): Payer: No Typology Code available for payment source

## 2021-05-13 ENCOUNTER — Encounter: Payer: Self-pay | Admitting: Family

## 2021-05-13 DIAGNOSIS — D509 Iron deficiency anemia, unspecified: Secondary | ICD-10-CM | POA: Diagnosis not present

## 2021-05-13 LAB — IBC + FERRITIN
Ferritin: 10.7 ng/mL (ref 10.0–291.0)
Iron: 102 ug/dL (ref 42–145)
Saturation Ratios: 30.9 % (ref 20.0–50.0)
TIBC: 330.4 ug/dL (ref 250.0–450.0)
Transferrin: 236 mg/dL (ref 212.0–360.0)

## 2021-05-14 DIAGNOSIS — Z0001 Encounter for general adult medical examination with abnormal findings: Secondary | ICD-10-CM | POA: Insufficient documentation

## 2021-05-14 DIAGNOSIS — Z13220 Encounter for screening for lipoid disorders: Secondary | ICD-10-CM | POA: Insufficient documentation

## 2021-05-14 NOTE — Assessment & Plan Note (Signed)
Preventative care handout given to pt  ?Healthcare screenings reviewed as applicable to pt ?Pt advised on safe sex, wearing seatbelts in car, and proper nutrition ?labwork ordered today for annual ?

## 2021-05-14 NOTE — Assessment & Plan Note (Signed)
Cbc today pending results 

## 2021-05-14 NOTE — Assessment & Plan Note (Signed)
Lipid panel ordered pending results.   

## 2021-05-14 NOTE — Assessment & Plan Note (Signed)
Dg lumbar spine pending ?Heat/ice prn ?Flexeril as needed ?Chronic, referral placed for neuosurgery as also increase of frequency exacerbation/episodes ?

## 2021-06-09 ENCOUNTER — Other Ambulatory Visit: Payer: Self-pay

## 2021-06-09 MED ORDER — PREDNISONE 10 MG PO TABS
ORAL_TABLET | ORAL | 0 refills | Status: DC
  Filled 2021-06-09: qty 21, 6d supply, fill #0

## 2021-08-17 ENCOUNTER — Other Ambulatory Visit: Payer: Self-pay

## 2021-08-17 MED ORDER — FLUCONAZOLE 150 MG PO TABS
ORAL_TABLET | ORAL | 0 refills | Status: DC
  Filled 2021-08-17: qty 2, 3d supply, fill #0

## 2021-09-30 ENCOUNTER — Ambulatory Visit
Admission: EM | Admit: 2021-09-30 | Discharge: 2021-09-30 | Disposition: A | Discharge status: Home / Self Care | Payer: No Typology Code available for payment source | Attending: Emergency Medicine | Admitting: Emergency Medicine

## 2021-09-30 DIAGNOSIS — J029 Acute pharyngitis, unspecified: Secondary | ICD-10-CM

## 2021-09-30 LAB — POCT RAPID STREP A (OFFICE): Rapid Strep A Screen: NEGATIVE

## 2021-09-30 NOTE — Discharge Instructions (Addendum)
The strep test is negative.  Take Tylenol or ibuprofen as needed.  Follow up with your primary care provider if your symptoms are not improving.

## 2021-09-30 NOTE — ED Provider Notes (Signed)
Renaldo Fiddler    CSN: [id number] Arrival date & time: 09/30/21  0816      History   Chief Complaint Chief Complaint  Patient presents with   Sore Throat    HPI Brittany Good is a 36 y.o. female.  Patient presents with sore throat since yesterday.  No fever, chills, rash, cough, shortness of breath, vomiting, diarrhea, or other symptoms.  No treatments at home.  Patient is going on vacation tomorrow and wants to make sure she does not have strep throat.  The history is provided by the patient.    Past Medical History:  Diagnosis Date   Acne    Depression    Miscarriage     Patient Active Problem List   Diagnosis Date Noted   Encounter for general adult medical examination with abnormal findings 05/14/2021   Screening for lipoid disorders 05/14/2021   Iron deficiency anemia 05/11/2021   Chronic bilateral low back pain without sciatica 05/11/2021    Past Surgical History:  Procedure Laterality Date   BREAST ENHANCEMENT SURGERY     BREAST ENHANCEMENT SURGERY     2014   DILATION AND CURETTAGE OF UTERUS     2018   DILATION AND EVACUATION N/A 12/07/2016   Procedure: DILATATION AND EVACUATION;  Surgeon: Conard Novak, MD;  Location: [facility] ORS;  Service: Gynecology;  Laterality: N/A;   LASIK     LASIK     2009   TONSILLECTOMY     1992   TONSILLECTOMY AND ADENOIDECTOMY      OB History     Gravida  4   Para  3   Term  3   Preterm      AB  1   Living  3      SAB  1   IAB      Ectopic      Multiple  0   Live Births  3            Home Medications    Prior to Admission medications   Medication Sig Start Date End Date Taking? Authorizing Provider  cyclobenzaprine (FLEXERIL) 10 MG tablet Take 1 tablet (10 mg total) by mouth 3 (three) times daily as needed for muscle spasms. 12/25/20   Waldon Merl, PA-C  fluconazole (DIFLUCAN) 150 MG tablet Take 1 tablet (150 mg total) by mouth once for 1 dose. May repeat tablet in 3 days  08/17/21     levonorgestrel (MIRENA) 20 MCG/24HR IUD 1 Intra Uterine Device (1 each total) by Intrauterine route once for 1 dose. 07/03/20 07/03/20  [name], CNM  Multiple Vitamins-Minerals (WOMENS MULTIVITAMIN PLUS PO) Take by mouth.    [provider]  naproxen (NAPROSYN) 500 MG tablet Take 1 tablet (500 mg total) by mouth 2 (two) times daily with a meal. 12/25/20   Waldon Merl, PA-C  predniSONE (DELTASONE) 10 MG tablet 6 day taper - Take as directed; take as needed pain flareup 06/09/21       Family History Family History  Problem Relation Age of Onset   Diabetes Maternal Grandfather    Cancer Maternal Grandfather    Cancer Maternal Grandmother        ? type   Cancer Paternal Grandmother        lung smoker    Depression Paternal Grandfather     Social History Social History   Tobacco Use   Smoking status: Never   Smokeless tobacco: Never  Vaping Use  Vaping Use: Never used  Substance Use Topics   Alcohol use: Yes    Comment: , occasionally   Drug use: No     Allergies   Patient has no known allergies.   Review of Systems Review of Systems  Constitutional:  Negative for chills and fever.  HENT:  Positive for sore throat. Negative for ear pain.   Respiratory:  Negative for cough and shortness of breath.   Gastrointestinal:  Negative for diarrhea and vomiting.  Skin:  Negative for color change and rash.  All other systems reviewed and are negative.    Physical Exam Triage Vital Signs ED Triage Vitals  Enc Vitals Group     BP      Pulse      Resp      Temp      Temp src      SpO2      Weight      Height      Head Circumference      Peak Flow      Pain Score      Pain Loc      Pain Edu?      Excl. in GC?    No data found.  Updated Vital Signs BP 137/87   Pulse 79   Temp 97.7 F (36.5 C)   Resp 18   LMP 09/11/2021   SpO2 99%   Visual Acuity Right Eye Distance:   Left Eye Distance:   Bilateral Distance:    Right Eye  Near:   Left Eye Near:    Bilateral Near:     Physical Exam Vitals and nursing note reviewed.  Constitutional:      General: She is not in acute distress.    Appearance: Normal appearance. She is well-developed. She is not ill-appearing.  HENT:     Right Ear: Tympanic membrane normal.     Left Ear: Tympanic membrane normal.     Nose: Nose normal.     Mouth/Throat:     Mouth: Mucous membranes are moist.     Pharynx: Oropharynx is clear.  Cardiovascular:     Rate and Rhythm: Normal rate and regular rhythm.     Heart sounds: Normal heart sounds.  Pulmonary:     Effort: Pulmonary effort is normal. No respiratory distress.     Breath sounds: Normal breath sounds.  Musculoskeletal:     Cervical back: Neck supple.  Skin:    General: Skin is warm and dry.  Neurological:     Mental Status: She is alert.  Psychiatric:        Mood and Affect: Mood normal.        Behavior: Behavior normal.      UC Treatments / Results  Labs (all labs ordered are listed, but only abnormal results are displayed) Labs Reviewed  POCT RAPID STREP A (OFFICE)    EKG   Radiology No results found.  Procedures Procedures (including critical care time)  Medications Ordered in UC Medications - No data to display  Initial Impression / Assessment and Plan / UC Course  I have reviewed the triage vital signs and the nursing notes.  Pertinent labs & imaging results that were available during my care of the patient were reviewed by me and considered in my medical decision making (see chart for details).    Sore throat.  Rapid strep negative.  Discussed symptomatic treatment including Tylenol or ibuprofen as needed.  Instructed patient to follow up with her  PCP if her symptoms are not improving.  She agrees to plan of care.    Final Clinical Impressions(s) / UC Diagnoses   Final diagnoses:  Sore throat     Discharge Instructions      The strep test is negative.  Take Tylenol or ibuprofen as  needed.  Follow up with your primary care provider if your symptoms are not improving.        ED Prescriptions   None    PDMP not reviewed this encounter.   Mickie Bail, NP 09/30/21 [phone]

## 2021-09-30 NOTE — ED Triage Notes (Signed)
Patient presents to Urgent Care with complaints of sore throat x 1 day. Requesting strep test. No other symptoms. Not taking any meds.

## 2021-12-02 ENCOUNTER — Telehealth: Payer: No Typology Code available for payment source

## 2022-01-18 ENCOUNTER — Encounter: Payer: Self-pay | Admitting: Family

## 2022-01-18 NOTE — Telephone Encounter (Signed)
Left message to return call to our office. Pt needs an appointment with Tabitha.

## 2022-01-18 NOTE — Telephone Encounter (Signed)
Appointment made for 01/19/2022

## 2022-01-19 ENCOUNTER — Encounter: Payer: Self-pay | Admitting: Family

## 2022-01-19 ENCOUNTER — Ambulatory Visit (INDEPENDENT_AMBULATORY_CARE_PROVIDER_SITE_OTHER): Payer: No Typology Code available for payment source | Admitting: Family

## 2022-01-19 ENCOUNTER — Other Ambulatory Visit: Payer: Self-pay

## 2022-01-19 VITALS — BP 116/68 | HR 71 | Temp 98.1°F | Resp 16 | Ht 68.0 in | Wt 162.0 lb

## 2022-01-19 DIAGNOSIS — F419 Anxiety disorder, unspecified: Secondary | ICD-10-CM | POA: Diagnosis not present

## 2022-01-19 DIAGNOSIS — F32A Depression, unspecified: Secondary | ICD-10-CM | POA: Diagnosis not present

## 2022-01-19 DIAGNOSIS — F1010 Alcohol abuse, uncomplicated: Secondary | ICD-10-CM | POA: Insufficient documentation

## 2022-01-19 MED ORDER — SERTRALINE HCL 50 MG PO TABS
50.0000 mg | ORAL_TABLET | Freq: Every day | ORAL | 1 refills | Status: DC
  Filled 2022-01-19: qty 30, 30d supply, fill #0

## 2022-01-19 NOTE — Assessment & Plan Note (Signed)
I do also agree with pt on going to AA, as I feel this will be very helpful. Commended pt on this decision to try to cut down on alcohol. Plan is to work on anxiety/depression with medications and see if we can decrease alcohol intake.

## 2022-01-19 NOTE — Progress Notes (Signed)
Established Patient Office Visit  Subjective:  Patient ID: Brittany Good, female    DOB: 02/21/1986  Age: 36 y.o. MRN: [id number]  CC:  Chief Complaint  Patient presents with   Depression    Want to get medication to help    HPI Brittany Good is here today for follow up.   Pt is with acute concerns. Her husband and her are going through some concerns, and they went onto talk space and tried to work on things through there. She does feel she has concerns with her alcohol intake. She feels as though she self soothes with alcohol to help calm her down. She is eager to break the cycle of drinking and help with depression by maybe being started on medication. Denies SI HI. She does state that a therapist thought she might need to be assessed for depression a swell. She does state she took lexapro one night in the past and didn't repeat because it made her sick.   She drinks about one bottle a night, four times a week.   Her grandpa on dads side, did have depression and had underwent shock therapy.   She is also looking into an AA meeting. Her brother recently started going as well, and has been sober for the last two months.      01/19/2022   11:50 AM  GAD 7 : Generalized Anxiety Score  Nervous, Anxious, on Edge 1  Control/stop worrying 1  Worry too much - different things 1  Trouble relaxing 1  Restless 0  Easily annoyed or irritable 1  Afraid - awful might happen 0  Total GAD 7 Score 5  Anxiety Difficulty Somewhat difficult       01/19/2022   11:50 AM 12/10/2019    4:08 PM 06/29/2017    8:33 AM  PHQ9 SCORE ONLY  PHQ-9 Total Score 7 2 0       Past Medical History:  Diagnosis Date   Acne    Depression    Miscarriage     Past Surgical History:  Procedure Laterality Date   BREAST ENHANCEMENT SURGERY     BREAST ENHANCEMENT SURGERY     2014   DILATION AND CURETTAGE OF UTERUS     2018   DILATION AND EVACUATION N/A 12/07/2016   Procedure: DILATATION AND  EVACUATION;  Surgeon: Will Bonnet, MD;  Location: [facility] ORS;  Service: Gynecology;  Laterality: N/A;   LASIK     LASIK     2009   TONSILLECTOMY     1992   TONSILLECTOMY AND ADENOIDECTOMY      Family History  Problem Relation Age of Onset   Alcohol abuse Brother    Cancer Maternal Grandmother        ? type   Diabetes Maternal Grandfather    Cancer Maternal Grandfather    Cancer Paternal Grandmother        lung smoker    Depression Paternal Grandfather     Social History   Socioeconomic History   Marital status: Married    Spouse name: Not on file   Number of children: 3   Years of education: Not on file   Highest education level: Not on file  Occupational History   Occupation: labor delivery nurse, RN  Tobacco Use   Smoking status: Never   Smokeless tobacco: Never  Vaping Use   Vaping Use: Never used  Substance and Sexual Activity   Alcohol use: Yes    Comment: ,  occasionally   Drug use: No   Sexual activity: Yes    Birth control/protection: I.U.D.  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    Wears seat belt, no guns, safe in relationship    Moved from [facility] in 2017 to [place]   Social Determinants of Health   Financial Resource Strain: Not on file  Food Insecurity: Not on file  Transportation Needs: Not on file  Physical Activity: Not on file  Stress: Not on file  Social Connections: Not on file  Intimate Partner Violence: Not on file    Outpatient Medications Prior to Visit  Medication Sig Dispense Refill   cyclobenzaprine (FLEXERIL) 10 MG tablet Take 1 tablet (10 mg total) by mouth 3 (three) times daily as needed for muscle spasms. 15 tablet 0   Multiple Vitamins-Minerals (WOMENS MULTIVITAMIN PLUS PO) Take by mouth.     naproxen (NAPROSYN) 500 MG tablet Take 1 tablet (500 mg total) by mouth 2 (two) times daily with a meal. 20 tablet 0    predniSONE (DELTASONE) 10 MG tablet 6 day taper - Take as directed; take as needed pain flareup 21 tablet 0   levonorgestrel (MIRENA) 20 MCG/24HR IUD 1 Intra Uterine Device (1 each total) by Intrauterine route once for 1 dose. 1 each 0   fluconazole (DIFLUCAN) 150 MG tablet Take 1 tablet (150 mg total) by mouth once for 1 dose. May repeat tablet in 3 days 2 tablet 0   No facility-administered medications prior to visit.    No Known Allergies       Objective:    Physical Exam Constitutional:      General: She is not in acute distress.    Appearance: Normal appearance. She is not ill-appearing, toxic-appearing or diaphoretic.  Neurological:     Mental Status: She is alert.  Psychiatric:        Attention and Perception: Attention normal.        Mood and Affect: Mood normal.        Speech: Speech normal.        Behavior: Behavior normal. Behavior is cooperative.        Thought Content: Thought content normal.        Cognition and Memory: Cognition and memory normal.       BP 116/68   Pulse 71   Temp 98.1 F (36.7 C)   Resp 16   Ht 5\' 8"  (1.727 m)   Wt 162 lb (73.5 kg)   LMP 01/12/2022 (Approximate)   SpO2 99%   Breastfeeding No   BMI 24.63 kg/m  Wt Readings from Last 3 Encounters:  01/19/22 162 lb (73.5 kg)  05/11/21 167 lb (75.8 kg)  07/03/20 161 lb (73 kg)     Health Maintenance Due  Topic Date Due   COVID-19 Vaccine (3 - Pfizer risk series) 05/22/2019   INFLUENZA VACCINE  10/13/2021   PAP SMEAR-Modifier  11/01/2021    There are no preventive care reminders to display for this patient.  Lab Results  Component Value Date   TSH 1.930 07/28/2017   Lab Results  Component Value Date   WBC 6.3 05/11/2021   HGB 12.6 05/11/2021   HCT 38.3 05/11/2021   MCV 92.0 05/11/2021   PLT 240.0 05/11/2021   Lab Results  Component Value Date  NA 139 05/11/2021   K 3.7 05/11/2021   CO2 30 05/11/2021   GLUCOSE 81 05/11/2021   BUN 12 05/11/2021   CREATININE 0.79  05/11/2021   BILITOT 0.5 05/11/2021   ALKPHOS 28 (L) 05/11/2021   AST 15 05/11/2021   ALT 10 05/11/2021   PROT 7.3 05/11/2021   ALBUMIN 4.6 05/11/2021   CALCIUM 9.7 05/11/2021   GFR 95.37 05/11/2021   Lab Results  Component Value Date   CHOL 158 05/11/2021   Lab Results  Component Value Date   HDL 67.40 05/11/2021   Lab Results  Component Value Date   LDLCALC 77 05/11/2021   Lab Results  Component Value Date   TRIG 66.0 05/11/2021   Lab Results  Component Value Date   CHOLHDL 2 05/11/2021   No results found for: "HGBA1C"    Assessment & Plan:   Problem List Items Addressed This Visit       Other   Anxiety and depression - Primary     I instructed pt to start zoloft 1/2 tablet once daily for 1 week and then increase to a full tablet once daily on week two as tolerated.  We discussed common side effects such as nausea, drowsiness and weight gain.  Also discussed rare but serious side effect of suicidal ideation.  She is instructed to discontinue medication and go directly to ED if this occurs.  Pt verbalizes understanding.  Plan is to follow up in 30 days to evaluate progress.    Recommend therapy, pt states is doing well with marriage therapist for now.        Relevant Medications   sertraline (ZOLOFT) 50 MG tablet   Alcohol abuse    I do also agree with pt on going to AA, as I feel this will be very helpful. Commended pt on this decision to try to cut down on alcohol. Plan is to work on anxiety/depression with medications and see if we can decrease alcohol intake.        Meds ordered this encounter  Medications   sertraline (ZOLOFT) 50 MG tablet    Sig: Take 1 tablet (50 mg total) by mouth daily.    Dispense:  30 tablet    Refill:  1    Order Specific Question:   Supervising Provider    Answer:   Diona Browner, AMY E [id number]    Follow-up: Return in about 1 month (around 02/18/2022) for f/u anxiety.    Eugenia Pancoast, FNP

## 2022-01-19 NOTE — Patient Instructions (Signed)
------------------------------------ Start sertraline (Zoloft) 50 mg for anxiety and depression. Take 1/2 tablet by mouth once daily for about one week, then increase to 1 full tablet thereafter.   Taking the medicine as directed and not missing any doses is one of the best things you can do to treat your anxiety/depression.  Here are some things to keep in mind:  Side effects (stomach upset, some increased anxiety) may happen before you notice a benefit.  These side effects typically go away over time. Changes to your dose of medicine or a change in medication all together is sometimes necessary Many people will notice an improvement within two weeks but the full effect of the medication can take up to 4-6 weeks Stopping the medication when you start feeling better often results in a return of symptoms. Most people need to be on medication at least 6-12 months If you start having thoughts of hurting yourself or others after starting this medicine, please call me immediately.    Regards,   Lenice Koper FNP-C     ------------------------------------  Managing Anxiety, Adult After being diagnosed with anxiety, you may be relieved to know why you have felt or behaved a certain way. You may also feel overwhelmed about the treatment ahead and what it will mean for your life. With care and support, you can manage this condition. How to manage lifestyle changes Managing stress and anxiety  Stress is your body's reaction to life changes and events, both good and bad. Most stress will last just a few hours, but stress can be ongoing and can lead to more than just stress. Although stress can play a major role in anxiety, it is not the same as anxiety. Stress is usually caused by something external, such as a deadline, test, or competition. Stress normally passes after the triggering event has ended.  Anxiety is caused by something internal, such as imagining a terrible outcome or worrying that  something will go wrong that will devastate you. Anxiety often does not go away even after the triggering event is over, and it can become long-term (chronic) worry. It is important to understand the differences between stress and anxiety and to manage your stress effectively so that it does not lead to an anxious response. Talk with your health care provider or a counselor to learn more about reducing anxiety and stress. He or she may suggest tension reduction techniques, such as: Music therapy. Spend time creating or listening to music that you enjoy and that inspires you. Mindfulness-based meditation. Practice being aware of your normal breaths while not trying to control your breathing. It can be done while sitting or walking. Centering prayer. This involves focusing on a word, phrase, or sacred image that means something to you and brings you peace. Deep breathing. To do this, expand your stomach and inhale slowly through your nose. Hold your breath for 3-5 seconds. Then exhale slowly, letting your stomach muscles relax. Self-talk. Learn to notice and identify thought patterns that lead to anxiety reactions and change those patterns to thoughts that feel peaceful. Muscle relaxation. Taking time to tense muscles and then relax them. Choose a tension reduction technique that fits your lifestyle and personality. These techniques take time and practice. Set aside 5-15 minutes a day to do them. Therapists can offer counseling and training in these techniques. The training to help with anxiety may be covered by some insurance plans. Other things you can do to manage stress and anxiety include: Keeping a stress diary.  This can help you learn what triggers your reaction and then learn ways to manage your response. Thinking about how you react to certain situations. You may not be able to control everything, but you can control your response. Making time for activities that help you relax and not feeling  guilty about spending your time in this way. Doing visual imagery. This involves imagining or creating mental pictures to help you relax. Practicing yoga. Through yoga poses, you can lower tension and promote relaxation.  Medicines Medicines can help ease symptoms. Medicines for anxiety include: Antidepressant medicines. These are usually prescribed for long-term daily control. Anti-anxiety medicines. These may be added in severe cases, especially when panic attacks occur. Medicines will be prescribed by a health care provider. When used together, medicines, psychotherapy, and tension reduction techniques may be the most effective treatment. Relationships Relationships can play a big part in helping you recover. Try to spend more time connecting with trusted friends and family members. Consider going to couples counseling if you have a partner, taking family education classes, or going to family therapy. Therapy can help you and others better understand your condition. How to recognize changes in your anxiety Everyone responds differently to treatment for anxiety. Recovery from anxiety happens when symptoms decrease and stop interfering with your daily activities at home or work. This may mean that you will start to: Have better concentration and focus. Worry will interfere less in your daily thinking. Sleep better. Be less irritable. Have more energy. Have improved memory. It is also important to recognize when your condition is getting worse. Contact your health care provider if your symptoms interfere with home or work and you feel like your condition is not improving. Follow these instructions at home: Activity Exercise. Adults should do the following: Exercise for at least 150 minutes each week. The exercise should increase your heart rate and make you sweat (moderate-intensity exercise). Strengthening exercises at least twice a week. Get the right amount and quality of sleep. Most  adults need 7-9 hours of sleep each night. Lifestyle  Eat a healthy diet that includes plenty of vegetables, fruits, whole grains, low-fat dairy products, and lean protein. Do not eat a lot of foods that are high in fats, added sugars, or salt (sodium). Make choices that simplify your life. Do not use any products that contain nicotine or tobacco. These products include cigarettes, chewing tobacco, and vaping devices, such as e-cigarettes. If you need help quitting, ask your health care provider. Avoid caffeine, alcohol, and certain over-the-counter cold medicines. These may make you feel worse. Ask your pharmacist which medicines to avoid. General instructions Take over-the-counter and prescription medicines only as told by your health care provider. Keep all follow-up visits. This is important. Where to find support You can get help and support from these sources: Self-help groups. Online and [employer]. A trusted spiritual leader. Couples counseling. Family education classes. Family therapy. Where to find more information You may find that joining a support group helps you deal with your anxiety. The following sources can help you locate counselors or support groups near you: Mental Health America: www.mentalhealthamerica.net Anxiety and Depression Association of [place] (ADAA): ProgramCam.de [employer] on Mental Illness (NAMI): www.nami.org Contact a health care provider if: You have a hard time staying focused or finishing daily tasks. You spend many hours a day feeling worried about everyday life. You become exhausted by worry. You start to have headaches or frequently feel tense. You develop chronic nausea or diarrhea. Get  help right away if: You have a racing heart and shortness of breath. You have thoughts of hurting yourself or others. If you ever feel like you may hurt yourself or others, or have thoughts about taking your own life, get help right away.  Go to your nearest emergency department or: Call your local emergency services (911 in the [place] Call a suicide crisis helpline, such as the National Suicide Prevention Lifeline at 678 231 5256 or 988 in the [place] This is open 24 hours a day in the [place] Text the Crisis Text Line at 319-468-7165 (in the [place] Summary Taking steps to learn and use tension reduction techniques can help calm you and help prevent triggering an anxiety reaction. When used together, medicines, psychotherapy, and tension reduction techniques may be the most effective treatment. Family, friends, and partners can play a big part in supporting you. This information is not intended to replace advice given to you by your health care provider. Make sure you discuss any questions you have with your health care provider. Document Revised: 09/24/2020 Document Reviewed: 06/22/2020 Elsevier Patient Education  2023 [employer].    ------------------------------------

## 2022-01-19 NOTE — Assessment & Plan Note (Addendum)
I instructed pt to start zoloft 1/2 tablet once daily for 1 week and then increase to a full tablet once daily on week two as tolerated.  We discussed common side effects such as nausea, drowsiness and weight gain.  Also discussed rare but serious side effect of suicidal ideation.  She is instructed to discontinue medication and go directly to ED if this occurs.  Pt verbalizes understanding.  Plan is to follow up in 30 days to evaluate progress.    Recommend therapy, pt states is doing well with marriage therapist for now.

## 2022-02-06 ENCOUNTER — Encounter: Payer: Self-pay | Admitting: Family

## 2022-02-16 ENCOUNTER — Other Ambulatory Visit: Payer: Self-pay

## 2022-02-16 ENCOUNTER — Ambulatory Visit: Payer: No Typology Code available for payment source | Admitting: Family

## 2022-02-16 ENCOUNTER — Telehealth (INDEPENDENT_AMBULATORY_CARE_PROVIDER_SITE_OTHER): Payer: No Typology Code available for payment source | Admitting: Family

## 2022-02-16 VITALS — HR 81 | Ht 68.0 in | Wt 162.0 lb

## 2022-02-16 DIAGNOSIS — F1011 Alcohol abuse, in remission: Secondary | ICD-10-CM | POA: Diagnosis not present

## 2022-02-16 DIAGNOSIS — F32A Depression, unspecified: Secondary | ICD-10-CM | POA: Diagnosis not present

## 2022-02-16 DIAGNOSIS — F419 Anxiety disorder, unspecified: Secondary | ICD-10-CM | POA: Diagnosis not present

## 2022-02-16 MED ORDER — VENLAFAXINE HCL ER 37.5 MG PO CP24
37.5000 mg | ORAL_CAPSULE | Freq: Every day | ORAL | 1 refills | Status: DC
  Filled 2022-02-16: qty 30, 30d supply, fill #0
  Filled 2022-03-17: qty 30, 30d supply, fill #1

## 2022-02-16 NOTE — Progress Notes (Signed)
MyChart Video Visit    Virtual Visit via Video Note   This visit type was conducted due to national recommendations for restrictions regarding the COVID-19 Pandemic (e.g. social distancing) in an effort to limit this patient's exposure and mitigate transmission in our community. This patient is at least at moderate risk for complications without adequate follow up. This format is felt to be most appropriate for this patient at this time. Physical exam was limited by quality of the video and audio technology used for the visit. CMA was able to get the patient set up on a video visit.  Patient location: Home. Patient and provider in visit Provider location: Office  I discussed the limitations of evaluation and management by telemedicine and the availability of in person appointments. The patient expressed understanding and agreed to proceed.  Visit Date: 02/16/2022  Today's healthcare provider: Mort Sawyers, FNP     Subjective:    Patient ID: Brittany Good, female    DOB: [birth year], 36 y.o.   MRN: [id number]  Chief Complaint  Patient presents with   Anxiety    Anxiety      Pt here today via video visit with concerns.   Anxiety/depression: started with 50 mg zoloft in the last one month. Does have diarrhea/bloating feeling. She is getting a headache and is curious if this is medication related or not. She has had headache for the last few days but she also has had her period so she is curious if it is hormone related. First day was intense headache,and tylenol did not help. At times she will feel pretty down without wanting to get out of bed at times, but other times feels a bit better on this medication.   Alcohol intake, four weeks sober today. She also started going once a week with AA meetings.      01/19/2022   11:50 AM 12/10/2019    4:08 PM 06/29/2017    8:33 AM  PHQ9 SCORE ONLY  PHQ-9 Total Score 7 2 0      01/19/2022   11:50 AM  GAD 7 : Generalized  Anxiety Score  Nervous, Anxious, on Edge 1  Control/stop worrying 1  Worry too much - different things 1  Trouble relaxing 1  Restless 0  Easily annoyed or irritable 1  Afraid - awful might happen 0  Total GAD 7 Score 5  Anxiety Difficulty Somewhat difficult     Past Medical History:  Diagnosis Date   Acne    Depression    Miscarriage     Past Surgical History:  Procedure Laterality Date   BREAST ENHANCEMENT SURGERY     BREAST ENHANCEMENT SURGERY     2014   DILATION AND CURETTAGE OF UTERUS     2018   DILATION AND EVACUATION N/A 12/07/2016   Procedure: DILATATION AND EVACUATION;  Surgeon: Conard Novak, MD;  Location: [facility] ORS;  Service: Gynecology;  Laterality: N/A;   LASIK     LASIK     2009   TONSILLECTOMY     1992   TONSILLECTOMY AND ADENOIDECTOMY      Family History  Problem Relation Age of Onset   Alcohol abuse Brother    Cancer Maternal Grandmother        ? type   Diabetes Maternal Grandfather    Cancer Maternal Grandfather    Cancer Paternal Grandmother        lung smoker    Depression Paternal Grandfather  Social History   Socioeconomic History   Marital status: Married    Spouse name: Not on file   Number of children: 3   Years of education: Not on file   Highest education level: Not on file  Occupational History   Occupation: labor delivery nurse, RN  Tobacco Use   Smoking status: Never   Smokeless tobacco: Never  Vaping Use   Vaping Use: Never used  Substance and Sexual Activity   Alcohol use: Yes    Comment: , occasionally   Drug use: No   Sexual activity: Yes    Birth control/protection: I.U.D.  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, historic buildings to read    Wears seat belt, no guns, safe in relationship    Moved from [facility] in 2017 to [place]   Social Determinants of Health   Financial Resource Strain: Not on file  Food Insecurity: Not  on file  Transportation Needs: Not on file  Physical Activity: Not on file  Stress: Not on file  Social Connections: Not on file  Intimate Partner Violence: Not on file    Outpatient Medications Prior to Visit  Medication Sig Dispense Refill   cyclobenzaprine (FLEXERIL) 10 MG tablet Take 1 tablet (10 mg total) by mouth 3 (three) times daily as needed for muscle spasms. 15 tablet 0   Multiple Vitamins-Minerals (WOMENS MULTIVITAMIN PLUS PO) Take by mouth.     naproxen (NAPROSYN) 500 MG tablet Take 1 tablet (500 mg total) by mouth 2 (two) times daily with a meal. 20 tablet 0   predniSONE (DELTASONE) 10 MG tablet 6 day taper - Take as directed; take as needed pain flareup 21 tablet 0   sertraline (ZOLOFT) 50 MG tablet Take 1 tablet (50 mg total) by mouth daily. 30 tablet 1   levonorgestrel (MIRENA) 20 MCG/24HR IUD 1 Intra Uterine Device (1 each total) by Intrauterine route once for 1 dose. 1 each 0   No facility-administered medications prior to visit.    No Known Allergies  Review of Systems     Objective:    Physical Exam Vitals reviewed.  Constitutional:      Appearance: Normal appearance.  Pulmonary:     Effort: Pulmonary effort is normal.  Neurological:     General: No focal deficit present.     Mental Status: She is alert.  Psychiatric:        Mood and Affect: Mood normal.        Behavior: Behavior normal.        Thought Content: Thought content normal.        Judgment: Judgment normal.     Pulse 81   Ht 5\' 8"  (1.727 m)   Wt 162 lb (73.5 kg)   LMP 02/12/2022 (Approximate)   BMI 24.63 kg/m  Wt Readings from Last 3 Encounters:  02/16/22 162 lb (73.5 kg)  01/19/22 162 lb (73.5 kg)  05/11/21 167 lb (75.8 kg)       Assessment & Plan:   Problem List Items Addressed This Visit       Other   Anxiety and depression - Primary    Dsicussed with pt having side effects with zoloft, change to venlafaxine 37.5 mg once daily.  Pt to f/u in three weeks or so.         Relevant Medications  venlafaxine XR (EFFEXOR XR) 37.5 MG 24 hr capsule   Alcohol abuse, in remission    Continue with AA  Commended for quitting drinking.         I have discontinued Psalm R. Hammes's sertraline. I am also having her start on venlafaxine XR. Additionally, I am having her maintain her Multiple Vitamins-Minerals (WOMENS MULTIVITAMIN PLUS PO), levonorgestrel, naproxen, cyclobenzaprine, and predniSONE.  Meds ordered this encounter  Medications   venlafaxine XR (EFFEXOR XR) 37.5 MG 24 hr capsule    Sig: Take 1 capsule (37.5 mg total) by mouth daily with breakfast.    Dispense:  30 capsule    Refill:  1    I discussed the assessment and treatment plan with the patient. The patient was provided an opportunity to ask questions and all were answered. The patient agreed with the plan and demonstrated an understanding of the instructions.   The patient was advised to call back or seek an in-person evaluation if the symptoms worsen or if the condition fails to improve as anticipated.  I provided 15 minutes of face-to-face time during this encounter.   Mort Sawyers, FNP [facility] [phone] (phone) [phone] (fax)  [facility]

## 2022-02-16 NOTE — Assessment & Plan Note (Signed)
Continue with AA  Commended for quitting drinking.

## 2022-02-16 NOTE — Patient Instructions (Signed)
  Stop zoloft  Start effexor 37.5 mg once daily.  F/u in the next three weeks and we will see how you are doing.    Regards,   Mort Sawyers FNP-C

## 2022-02-16 NOTE — Assessment & Plan Note (Signed)
Dsicussed with pt having side effects with zoloft, change to venlafaxine 37.5 mg once daily.  Pt to f/u in three weeks or so.

## 2022-02-26 ENCOUNTER — Encounter: Payer: Self-pay | Admitting: Family

## 2022-03-17 ENCOUNTER — Other Ambulatory Visit: Payer: Self-pay

## 2022-03-17 NOTE — Telephone Encounter (Signed)
Pt is due for 3 week f/u from last appt. We will discuss this at the visit f/u.

## 2022-03-18 ENCOUNTER — Other Ambulatory Visit: Payer: Self-pay

## 2022-03-18 ENCOUNTER — Encounter: Payer: Self-pay | Admitting: Family

## 2022-03-19 ENCOUNTER — Other Ambulatory Visit: Payer: Self-pay

## 2022-03-19 MED ORDER — VENLAFAXINE HCL ER 75 MG PO CP24
75.0000 mg | ORAL_CAPSULE | Freq: Every day | ORAL | 0 refills | Status: DC
  Filled 2022-03-19 – 2022-03-29 (×2): qty 90, 90d supply, fill #0

## 2022-03-22 ENCOUNTER — Encounter: Payer: Self-pay | Admitting: Family

## 2022-03-22 ENCOUNTER — Telehealth (INDEPENDENT_AMBULATORY_CARE_PROVIDER_SITE_OTHER): Payer: 59 | Admitting: Family

## 2022-03-22 DIAGNOSIS — F32A Depression, unspecified: Secondary | ICD-10-CM | POA: Diagnosis not present

## 2022-03-22 DIAGNOSIS — F419 Anxiety disorder, unspecified: Secondary | ICD-10-CM

## 2022-03-22 NOTE — Assessment & Plan Note (Signed)
Continue with effexor 75 mg, new increase in the last few days. F/u four weeks. Referral also placed for psychiatrist for evaluation.   Continue to work on Radiographer, therapeutic. Recommend getting with a therapist to discuss as well.   If any SI HI get more urgent help.

## 2022-03-22 NOTE — Patient Instructions (Signed)
Recommend Brittany Good with EAP if you are able to get in with her. Continue with talkspace.    Regards,   Ravleen Ries FNP-C Managing Anxiety, Adult After being diagnosed with anxiety, you may be relieved to know why you have felt or behaved a certain way. You may also feel overwhelmed about the treatment ahead and what it will mean for your life. With care and support, you can manage this condition. How to manage lifestyle changes Managing stress and anxiety  Stress is your body's reaction to life changes and events, both good and bad. Most stress will last just a few hours, but stress can be ongoing and can lead to more than just stress. Although stress can play a major role in anxiety, it is not the same as anxiety. Stress is usually caused by something external, such as a deadline, test, or competition. Stress normally passes after the triggering event has ended.  Anxiety is caused by something internal, such as imagining a terrible outcome or worrying that something will go wrong that will devastate you. Anxiety often does not go away even after the triggering event is over, and it can become long-term (chronic) worry. It is important to understand the differences between stress and anxiety and to manage your stress effectively so that it does not lead to an anxious response. Talk with your health care provider or a counselor to learn more about reducing anxiety and stress. He or she may suggest tension reduction techniques, such as: Music therapy. Spend time creating or listening to music that you enjoy and that inspires you. Mindfulness-based meditation. Practice being aware of your normal breaths while not trying to control your breathing. It can be done while sitting or walking. Centering prayer. This involves focusing on a word, phrase, or sacred image that means something to you and brings you peace. Deep breathing. To do this, expand your stomach and inhale slowly through your  nose. Hold your breath for 3-5 seconds. Then exhale slowly, letting your stomach muscles relax. Self-talk. Learn to notice and identify thought patterns that lead to anxiety reactions and change those patterns to thoughts that feel peaceful. Muscle relaxation. Taking time to tense muscles and then relax them. Choose a tension reduction technique that fits your lifestyle and personality. These techniques take time and practice. Set aside 5-15 minutes a day to do them. Therapists can offer counseling and training in these techniques. The training to help with anxiety may be covered by some insurance plans. Other things you can do to manage stress and anxiety include: Keeping a stress diary. This can help you learn what triggers your reaction and then learn ways to manage your response. Thinking about how you react to certain situations. You may not be able to control everything, but you can control your response. Making time for activities that help you relax and not feeling guilty about spending your time in this way. Doing visual imagery. This involves imagining or creating mental pictures to help you relax. Practicing yoga. Through yoga poses, you can lower tension and promote relaxation.  Medicines Medicines can help ease symptoms. Medicines for anxiety include: Antidepressant medicines. These are usually prescribed for long-term daily control. Anti-anxiety medicines. These may be added in severe cases, especially when panic attacks occur. Medicines will be prescribed by a health care provider. When used together, medicines, psychotherapy, and tension reduction techniques may be the most effective treatment. Relationships Relationships can play a big part in helping you recover. Try to  spend more time connecting with trusted friends and family members. Consider going to couples counseling if you have a partner, taking family education classes, or going to family therapy. Therapy can help you and  others better understand your condition. How to recognize changes in your anxiety Everyone responds differently to treatment for anxiety. Recovery from anxiety happens when symptoms decrease and stop interfering with your daily activities at home or work. This may mean that you will start to: Have better concentration and focus. Worry will interfere less in your daily thinking. Sleep better. Be less irritable. Have more energy. Have improved memory. It is also important to recognize when your condition is getting worse. Contact your health care provider if your symptoms interfere with home or work and you feel like your condition is not improving. Follow these instructions at home: Activity Exercise. Adults should do the following: Exercise for at least 150 minutes each week. The exercise should increase your heart rate and make you sweat (moderate-intensity exercise). Strengthening exercises at least twice a week. Get the right amount and quality of sleep. Most adults need 7-9 hours of sleep each night. Lifestyle  Eat a healthy diet that includes plenty of vegetables, fruits, whole grains, low-fat dairy products, and lean protein. Do not eat a lot of foods that are high in fats, added sugars, or salt (sodium). Make choices that simplify your life. Do not use any products that contain nicotine or tobacco. These products include cigarettes, chewing tobacco, and vaping devices, such as e-cigarettes. If you need help quitting, ask your health care provider. Avoid caffeine, alcohol, and certain over-the-counter cold medicines. These may make you feel worse. Ask your pharmacist which medicines to avoid. General instructions Take over-the-counter and prescription medicines only as told by your health care provider. Keep all follow-up visits. This is important. Where to find support You can get help and support from these sources: Self-help groups. Online and [employer]. A trusted  spiritual leader. Couples counseling. Family education classes. Family therapy. Where to find more information You may find that joining a support group helps you deal with your anxiety. The following sources can help you locate counselors or support groups near you: [place]: www.mentalhealthamerica.net Anxiety and Depression Association of [place] (ADAA): https://www.clark.net/ National Alliance on Mental Illness (NAMI): www.nami.org Contact a health care provider if: You have a hard time staying focused or finishing daily tasks. You spend many hours a day feeling worried about everyday life. You become exhausted by worry. You start to have headaches or frequently feel tense. You develop chronic nausea or diarrhea. Get help right away if: You have a racing heart and shortness of breath. You have thoughts of hurting yourself or others. If you ever feel like you may hurt yourself or others, or have thoughts about taking your own life, get help right away. Go to your nearest emergency department or: Call your local emergency services (911 in the [place] Call a suicide crisis helpline, such as the [place] at (267) 481-3127 or 988 in the [place]. This is open 24 hours a day in the [place] Text the Crisis Text Line at 737-849-4056 (in the [place].). Summary Taking steps to learn and use tension reduction techniques can help calm you and help prevent triggering an anxiety reaction. When used together, medicines, psychotherapy, and tension reduction techniques may be the most effective treatment. Family, friends, and partners can play a big part in supporting you. This information is not intended to replace advice given  to you by your health care provider. Make sure you discuss any questions you have with your health care provider. Document Revised: 09/24/2020 Document Reviewed: 06/22/2020 Elsevier Patient Education  2023 [employer].

## 2022-03-22 NOTE — Telephone Encounter (Signed)
Saw patient already today. Please see note for further information.

## 2022-03-22 NOTE — Progress Notes (Signed)
MyChart Video Visit    Virtual Visit via Video Note   This visit type was conducted due to national recommendations for restrictions regarding the COVID-19 Pandemic (e.g. social distancing) in an effort to limit this patient's exposure and mitigate transmission in our community. This patient is at least at moderate risk for complications without adequate follow up. This format is felt to be most appropriate for this patient at this time. Physical exam was limited by quality of the video and audio technology used for the visit. CMA was able to get the patient set up on a video visit.  Patient location: Home. Patient and provider in visit Provider location: Office  I discussed the limitations of evaluation and management by telemedicine and the availability of in person appointments. The patient expressed understanding and agreed to proceed.  Visit Date: 03/22/2022  Today's healthcare provider: Eugenia Pancoast, FNP     Subjective:    Patient ID: Brittany Good, female    DOB: 09-Oct-1985, 37 y.o.   MRN: [id number]  Chief Complaint  Patient presents with   Follow-up    Medication follow up    HPI  Pt here today via video visit for f/u   Anxiety depression: last visit 12/5 changed to venlafaxine 37.5 from zoloft as was experiencing side effects. She is taking 75 mcg once daily as of two days ago. She states stomach has been slightly upset but not sure if this is a side effect of this medication or not. She uses talk-space currently, but not utilizing EAP. She isn't using them currently.   Sleep on and off. Has a two year old so this isn't helping sleep either and she works night shift.   Alcohol abuse in remission: has not had any alcohol in the last 60 days, quit cold [place]. Cravings are not 'terrible' only every once in a while.      03/22/2022    9:11 AM 01/19/2022   11:50 AM 12/10/2019    4:08 PM  PHQ9 SCORE ONLY  PHQ-9 Total Score 2 7 2   Slight decrease since last  visit with phq9 which is good.      03/22/2022    9:11 AM 01/19/2022   11:50 AM  GAD 7 : Generalized Anxiety Score  Nervous, Anxious, on Edge 0 1  Control/stop worrying 0 1  Worry too much - different things 0 1  Trouble relaxing 0 1  Restless 0 0  Easily annoyed or irritable 0 1  Afraid - awful might happen 0 0  Total GAD 7 Score 0 5  Anxiety Difficulty Not difficult at all Somewhat difficult      Past Medical History:  Diagnosis Date   Acne    Depression    Miscarriage     Past Surgical History:  Procedure Laterality Date   BREAST ENHANCEMENT SURGERY     BREAST ENHANCEMENT SURGERY     2014   DILATION AND CURETTAGE OF UTERUS     2018   DILATION AND EVACUATION N/A 12/07/2016   Procedure: DILATATION AND EVACUATION;  Surgeon: Will Bonnet, MD;  Location: [facility] ORS;  Service: Gynecology;  Laterality: N/A;   LASIK     LASIK     2009   TONSILLECTOMY     1992   TONSILLECTOMY AND ADENOIDECTOMY      Family History  Problem Relation Age of Onset   Alcohol abuse Brother    Cancer Maternal Grandmother        ?  type   Diabetes Maternal Grandfather    Cancer Maternal Grandfather    Cancer Paternal Grandmother        lung smoker    Depression Paternal Grandfather     Social History   Socioeconomic History   Marital status: Married    Spouse name: Not on file   Number of children: 3   Years of education: Not on file   Highest education level: Not on file  Occupational History   Occupation: labor delivery nurse, RN  Tobacco Use   Smoking status: Never   Smokeless tobacco: Never  Vaping Use   Vaping Use: Never used  Substance and Sexual Activity   Alcohol use: Yes    Comment: , occasionally   Drug use: No   Sexual activity: Yes    Birth control/protection: I.U.D.  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, historic buildings to read    Wears seat belt, no guns, safe in  relationship    Moved from [facility] in [place] to [place]   Social Determinants of Health   Financial Resource Strain: Not on file  Food Insecurity: Not on file  Transportation Needs: Not on file  Physical Activity: Not on file  Stress: Not on file  Social Connections: Not on file  Intimate Partner Violence: Not on file    Outpatient Medications Prior to Visit  Medication Sig Dispense Refill   cyclobenzaprine (FLEXERIL) 10 MG tablet Take 1 tablet (10 mg total) by mouth 3 (three) times daily as needed for muscle spasms. 15 tablet 0   levonorgestrel (MIRENA) 20 MCG/DAY IUD by Intrauterine route.     Multiple Vitamins-Minerals (WOMENS MULTIVITAMIN PLUS PO) Take by mouth.     naproxen (NAPROSYN) 500 MG tablet Take 1 tablet (500 mg total) by mouth 2 (two) times daily with a meal. 20 tablet 0   venlafaxine XR (EFFEXOR XR) 75 MG 24 hr capsule Take 1 capsule (75 mg total) by mouth daily with breakfast. 90 capsule 0   levonorgestrel (MIRENA) 20 MCG/24HR IUD 1 Intra Uterine Device (1 each total) by Intrauterine route once for 1 dose. 1 each 0   predniSONE (DELTASONE) 10 MG tablet 6 day taper - Take as directed; take as needed pain flareup (Patient not taking: Reported on 03/22/2022) 21 tablet 0   No facility-administered medications prior to visit.    No Known Allergies  Review of Systems     Objective:    Physical Exam Constitutional:      General: She is not in acute distress.    Appearance: Normal appearance. She is not ill-appearing or toxic-appearing.  Pulmonary:     Effort: Pulmonary effort is normal.  Neurological:     General: No focal deficit present.     Mental Status: She is alert and oriented to person, place, and time. Mental status is at baseline.  Psychiatric:        Mood and Affect: Mood normal.        Behavior: Behavior normal.        Thought Content: Thought content normal.        Judgment: Judgment normal.     There were no vitals taken for this visit. Wt Readings from  Last 3 Encounters:  02/16/22 162 lb (73.5 kg)  01/19/22 162 lb (73.5 kg)  05/11/21 167 lb (75.8 kg)  Assessment & Plan:   Problem List Items Addressed This Visit       Other   Anxiety and depression - Primary    Continue with effexor 75 mg, new increase in the last few days. F/u four weeks. Referral also placed for psychiatrist for evaluation.   Continue to work on Pharmacologist. Recommend getting with a therapist to discuss as well.   If any SI HI get more urgent help.        I have discontinued Brittany Good's predniSONE. I am also having her maintain her Multiple Vitamins-Minerals (WOMENS MULTIVITAMIN PLUS PO), levonorgestrel, naproxen, cyclobenzaprine, venlafaxine XR, and levonorgestrel.  No orders of the defined types were placed in this encounter.   I discussed the assessment and treatment plan with the patient. The patient was provided an opportunity to ask questions and all were answered. The patient agreed with the plan and demonstrated an understanding of the instructions.   The patient was advised to call back or seek an in-person evaluation if the symptoms worsen or if the condition fails to improve as anticipated.  I provided 15 minutes of face-to-face time during this encounter.   Mort Sawyers, FNP [facility] [place] [phone] (phone) [phone] (fax)  [facility]

## 2022-03-26 ENCOUNTER — Other Ambulatory Visit: Payer: Self-pay

## 2022-03-29 ENCOUNTER — Other Ambulatory Visit: Payer: Self-pay

## 2022-05-10 ENCOUNTER — Other Ambulatory Visit: Payer: Self-pay

## 2022-05-10 DIAGNOSIS — F4011 Social phobia, generalized: Secondary | ICD-10-CM | POA: Diagnosis not present

## 2022-05-10 DIAGNOSIS — F331 Major depressive disorder, recurrent, moderate: Secondary | ICD-10-CM | POA: Diagnosis not present

## 2022-05-10 DIAGNOSIS — F3281 Premenstrual dysphoric disorder: Secondary | ICD-10-CM | POA: Diagnosis not present

## 2022-05-10 DIAGNOSIS — F1011 Alcohol abuse, in remission: Secondary | ICD-10-CM | POA: Diagnosis not present

## 2022-05-10 MED ORDER — FLUOXETINE HCL 20 MG PO CAPS
20.0000 mg | ORAL_CAPSULE | Freq: Every day | ORAL | 0 refills | Status: DC
  Filled 2022-05-10: qty 30, 30d supply, fill #0

## 2022-05-11 DIAGNOSIS — F4011 Social phobia, generalized: Secondary | ICD-10-CM | POA: Diagnosis not present

## 2022-05-11 DIAGNOSIS — F331 Major depressive disorder, recurrent, moderate: Secondary | ICD-10-CM | POA: Diagnosis not present

## 2022-06-07 ENCOUNTER — Other Ambulatory Visit: Payer: Self-pay

## 2022-06-07 DIAGNOSIS — F4011 Social phobia, generalized: Secondary | ICD-10-CM | POA: Diagnosis not present

## 2022-06-07 DIAGNOSIS — F1011 Alcohol abuse, in remission: Secondary | ICD-10-CM | POA: Diagnosis not present

## 2022-06-07 DIAGNOSIS — F331 Major depressive disorder, recurrent, moderate: Secondary | ICD-10-CM | POA: Diagnosis not present

## 2022-06-07 DIAGNOSIS — F3281 Premenstrual dysphoric disorder: Secondary | ICD-10-CM | POA: Diagnosis not present

## 2022-06-07 MED ORDER — FLUOXETINE HCL 20 MG PO CAPS
ORAL_CAPSULE | Freq: Every day | ORAL | 1 refills | Status: DC
  Filled 2022-06-07: qty 30, 30d supply, fill #0
  Filled 2022-07-06: qty 30, 30d supply, fill #1

## 2022-07-08 ENCOUNTER — Other Ambulatory Visit (HOSPITAL_COMMUNITY): Payer: Self-pay

## 2022-07-08 ENCOUNTER — Other Ambulatory Visit (HOSPITAL_BASED_OUTPATIENT_CLINIC_OR_DEPARTMENT_OTHER): Payer: Self-pay

## 2022-07-08 ENCOUNTER — Encounter (HOSPITAL_COMMUNITY): Payer: Self-pay

## 2022-07-12 ENCOUNTER — Other Ambulatory Visit: Payer: Self-pay

## 2022-07-12 ENCOUNTER — Encounter: Payer: Self-pay | Admitting: Family

## 2022-07-12 ENCOUNTER — Ambulatory Visit (INDEPENDENT_AMBULATORY_CARE_PROVIDER_SITE_OTHER): Payer: 59 | Admitting: Family

## 2022-07-12 VITALS — BP 110/64 | HR 76 | Temp 98.2°F | Ht 68.0 in | Wt 145.0 lb

## 2022-07-12 DIAGNOSIS — Z13220 Encounter for screening for lipoid disorders: Secondary | ICD-10-CM | POA: Diagnosis not present

## 2022-07-12 DIAGNOSIS — M545 Low back pain, unspecified: Secondary | ICD-10-CM

## 2022-07-12 DIAGNOSIS — R233 Spontaneous ecchymoses: Secondary | ICD-10-CM | POA: Diagnosis not present

## 2022-07-12 DIAGNOSIS — F331 Major depressive disorder, recurrent, moderate: Secondary | ICD-10-CM | POA: Diagnosis not present

## 2022-07-12 DIAGNOSIS — F3281 Premenstrual dysphoric disorder: Secondary | ICD-10-CM | POA: Diagnosis not present

## 2022-07-12 DIAGNOSIS — F329 Major depressive disorder, single episode, unspecified: Secondary | ICD-10-CM | POA: Insufficient documentation

## 2022-07-12 DIAGNOSIS — Z0000 Encounter for general adult medical examination without abnormal findings: Secondary | ICD-10-CM

## 2022-07-12 DIAGNOSIS — G8929 Other chronic pain: Secondary | ICD-10-CM | POA: Diagnosis not present

## 2022-07-12 DIAGNOSIS — D509 Iron deficiency anemia, unspecified: Secondary | ICD-10-CM | POA: Diagnosis not present

## 2022-07-12 DIAGNOSIS — F1011 Alcohol abuse, in remission: Secondary | ICD-10-CM

## 2022-07-12 DIAGNOSIS — F4010 Social phobia, unspecified: Secondary | ICD-10-CM | POA: Insufficient documentation

## 2022-07-12 DIAGNOSIS — F4011 Social phobia, generalized: Secondary | ICD-10-CM | POA: Diagnosis not present

## 2022-07-12 LAB — BASIC METABOLIC PANEL
BUN: 16 mg/dL (ref 6–23)
CO2: 29 mEq/L (ref 19–32)
Calcium: 9.5 mg/dL (ref 8.4–10.5)
Chloride: 100 mEq/L (ref 96–112)
Creatinine, Ser: 0.89 mg/dL (ref 0.40–1.20)
GFR: 81.99 mL/min (ref >60.00)
Glucose, Bld: 98 mg/dL (ref 70–99)
Potassium: 3.7 mEq/L (ref 3.5–5.1)
Sodium: 136 mEq/L (ref 135–145)

## 2022-07-12 LAB — LIPID PANEL
Cholesterol: 171 mg/dL (ref 0–200)
HDL: 68.7 mg/dL (ref >39.00)
LDL Cholesterol: 92 mg/dL (ref 0–99)
NonHDL: 102.15
Total CHOL/HDL Ratio: 2
Triglycerides: 53 mg/dL (ref 0.0–149.0)
VLDL: 10.6 mg/dL (ref 0.0–40.0)

## 2022-07-12 MED ORDER — FLUOXETINE HCL 20 MG PO CAPS
20.0000 mg | ORAL_CAPSULE | Freq: Every day | ORAL | 0 refills | Status: DC
  Filled 2022-07-12 – 2022-08-06 (×2): qty 90, 90d supply, fill #0

## 2022-07-12 NOTE — Assessment & Plan Note (Signed)
stable °

## 2022-07-12 NOTE — Assessment & Plan Note (Signed)
Stable

## 2022-07-12 NOTE — Assessment & Plan Note (Signed)
Patient Counseling(The following topics were reviewed): ? Preventative care handout given to pt  ?Health maintenance and immunizations reviewed. Please refer to Health maintenance section. ?Pt advised on safe sex, wearing seatbelts in car, and proper nutrition ?labwork ordered today for annual ?Dental health: Discussed importance of regular tooth brushing, flossing, and dental visits. ? ? ?

## 2022-07-12 NOTE — Progress Notes (Signed)
Subjective:  Patient ID: Brittany Good, female    DOB: 19-Jul-1985  Age: 37 y.o. MRN: [id number]  Patient Care Team: Mort Sawyers, FNP as PCP - General (Family Medicine)   CC:  Chief Complaint  Patient presents with   Annual Exam    HPI Brittany Good is a 37 y.o. female who presents today for an annual physical exam. She reports consuming a general diet. Avoids red meat and or pork.  Gym/ health club routine includes cardio. She generally feels well. She reports sleeping well. She does not have additional problems to discuss today.   Vision:Within last year Dental:Receives regular dental care STD:The patient denies history of sexually transmitted disease.  Lung Cancer Screening with low-dose Chest CT: n/a - Adults age 84-80 who are current cigarette smokers or quit within the last 15 years. Must have 20 pack year history.  AAA Screening: n/a - Men age 75-75 who have ever smoked  Mammogram: n/a Last pap: 2020 pt will follow up with GYN  Pt is with acute concerns.  Has noticed some easy bruising over the last six months, around the time that she started prozac.   Advanced Directives Patient does not have advanced directives including  n/a . She does not have a copy in the electronic medical record.   DEPRESSION SCREENING    07/12/2022    7:25 AM 03/22/2022    9:11 AM 01/19/2022   11:50 AM 12/10/2019    4:08 PM 06/29/2017    8:33 AM  PHQ 2/9 Scores  PHQ - 2 Score 0 0 2 0 0  PHQ- 9 Score  2 7 2       ROS: Negative unless specifically indicated above in HPI.    Current Outpatient Medications:    cyclobenzaprine (FLEXERIL) 10 MG tablet, Take 1 tablet (10 mg total) by mouth 3 (three) times daily as needed for muscle spasms., Disp: 15 tablet, Rfl: 0   FLUoxetine (PROZAC) 20 MG capsule, Take 1 daily, Disp: 30 capsule, Rfl: 1   levonorgestrel (MIRENA) 20 MCG/24HR IUD, 1 Intra Uterine Device (1 each total) by Intrauterine route once for 1 dose., Disp: 1 each, Rfl: 0    levonorgestrel (MIRENA) 20 MCG/DAY IUD, by Intrauterine route., Disp: , Rfl:    Multiple Vitamins-Minerals (WOMENS MULTIVITAMIN PLUS PO), Take by mouth., Disp: , Rfl:    naproxen (NAPROSYN) 500 MG tablet, Take 1 tablet (500 mg total) by mouth 2 (two) times daily with a meal., Disp: 20 tablet, Rfl: 0    Objective:    BP 110/64 (BP Location: Left Arm)   Pulse 76   Temp 98.2 F (36.8 C) (Temporal)   Ht 5\' 8"  (1.727 m)   Wt 145 lb (65.8 kg)   LMP 06/28/2022 (Approximate)   SpO2 99%   BMI 22.05 kg/m   BP Readings from Last 3 Encounters:  07/12/22 110/64  01/19/22 116/68  09/30/21 137/87      Physical Exam Constitutional:      General: She is not in acute distress.    Appearance: Normal appearance. She is normal weight. She is not ill-appearing.  HENT:     Head: Normocephalic.     Right Ear: Tympanic membrane normal.     Left Ear: Tympanic membrane normal.     Nose: Nose normal.     Mouth/Throat:     Mouth: Mucous membranes are moist.  Eyes:     Extraocular Movements: Extraocular movements intact.     Pupils: Pupils are equal, round,  and reactive to light.  Cardiovascular:     Rate and Rhythm: Normal rate and regular rhythm.  Pulmonary:     Effort: Pulmonary effort is normal.     Breath sounds: Normal breath sounds.  Abdominal:     General: Abdomen is flat. Bowel sounds are normal.     Palpations: Abdomen is soft.     Tenderness: There is no guarding or rebound.  Musculoskeletal:        General: Normal range of motion.     Cervical back: Normal range of motion.  Skin:    General: Skin is warm.     Capillary Refill: Capillary refill takes less than 2 seconds.  Neurological:     General: No focal deficit present.     Mental Status: She is alert.  Psychiatric:        Mood and Affect: Mood normal.        Behavior: Behavior normal.        Thought Content: Thought content normal.        Judgment: Judgment normal.          Assessment & Plan:  Alcohol abuse, in  remission Assessment & Plan: Commended on alcohol use in remission    Chronic bilateral low back pain without sciatica Assessment & Plan: Stable    Easy bruising -     PT and PTT  Iron deficiency anemia, unspecified iron deficiency anemia type Assessment & Plan: stable   Encounter for general adult medical examination without abnormal findings Assessment & Plan: Patient Counseling(The following topics were reviewed):  Preventative care handout given to pt  Health maintenance and immunizations reviewed. Please refer to Health maintenance section. Pt advised on safe sex, wearing seatbelts in car, and proper nutrition labwork ordered today for annual Dental health: Discussed importance of regular tooth brushing, flossing, and dental visits.   Orders: -     Lipid panel -     Basic metabolic panel -     PT and PTT  Screening for lipoid disorders -     Lipid panel      Follow-up: No follow-ups on file.   Mort Sawyers, FNP

## 2022-07-12 NOTE — Assessment & Plan Note (Signed)
Commended on alcohol use in remission

## 2022-07-13 LAB — PT AND PTT
INR: 1 (ref 0.9–1.2)
Prothrombin Time: 11.2 s (ref 9.1–12.0)
aPTT: 26 s (ref 24–33)

## 2022-08-06 ENCOUNTER — Other Ambulatory Visit: Payer: Self-pay

## 2022-08-19 DIAGNOSIS — F331 Major depressive disorder, recurrent, moderate: Secondary | ICD-10-CM | POA: Diagnosis not present

## 2022-08-19 DIAGNOSIS — F4011 Social phobia, generalized: Secondary | ICD-10-CM | POA: Diagnosis not present

## 2022-08-20 ENCOUNTER — Other Ambulatory Visit: Payer: Self-pay

## 2022-08-20 MED ORDER — NALTREXONE HCL 50 MG PO TABS
50.0000 mg | ORAL_TABLET | Freq: Every day | ORAL | 0 refills | Status: DC
  Filled 2022-08-20: qty 30, 30d supply, fill #0

## 2022-08-20 MED ORDER — FLUOXETINE HCL 40 MG PO CAPS
40.0000 mg | ORAL_CAPSULE | Freq: Every day | ORAL | 0 refills | Status: DC
  Filled 2022-08-20: qty 30, 30d supply, fill #0

## 2022-09-23 ENCOUNTER — Other Ambulatory Visit: Payer: Self-pay

## 2022-09-23 DIAGNOSIS — F331 Major depressive disorder, recurrent, moderate: Secondary | ICD-10-CM | POA: Diagnosis not present

## 2022-09-23 DIAGNOSIS — F1011 Alcohol abuse, in remission: Secondary | ICD-10-CM | POA: Diagnosis not present

## 2022-09-23 DIAGNOSIS — F4011 Social phobia, generalized: Secondary | ICD-10-CM | POA: Diagnosis not present

## 2022-09-23 DIAGNOSIS — F3281 Premenstrual dysphoric disorder: Secondary | ICD-10-CM | POA: Diagnosis not present

## 2022-09-23 MED ORDER — FLUOXETINE HCL 40 MG PO CAPS
40.0000 mg | ORAL_CAPSULE | Freq: Every day | ORAL | 0 refills | Status: DC
  Filled 2022-09-23: qty 90, 90d supply, fill #0

## 2022-09-24 ENCOUNTER — Other Ambulatory Visit: Payer: Self-pay

## 2022-10-31 ENCOUNTER — Other Ambulatory Visit: Payer: Self-pay | Admitting: Obstetrics

## 2022-10-31 MED ORDER — OFLOXACIN 0.3 % OP SOLN
1.0000 [drp] | Freq: Four times a day (QID) | OPHTHALMIC | 0 refills | Status: AC
  Filled 2022-10-31: qty 10, 50d supply, fill #0

## 2022-11-01 ENCOUNTER — Other Ambulatory Visit: Payer: Self-pay

## 2022-11-22 ENCOUNTER — Other Ambulatory Visit: Payer: Self-pay

## 2022-11-22 DIAGNOSIS — F1011 Alcohol abuse, in remission: Secondary | ICD-10-CM | POA: Diagnosis not present

## 2022-11-22 DIAGNOSIS — F3281 Premenstrual dysphoric disorder: Secondary | ICD-10-CM | POA: Diagnosis not present

## 2022-11-22 DIAGNOSIS — F4011 Social phobia, generalized: Secondary | ICD-10-CM | POA: Diagnosis not present

## 2022-11-22 DIAGNOSIS — F331 Major depressive disorder, recurrent, moderate: Secondary | ICD-10-CM | POA: Diagnosis not present

## 2022-11-22 MED ORDER — FLUOXETINE HCL 40 MG PO CAPS: 40.0000 mg | ORAL_CAPSULE | Freq: Every day | ORAL | 0 refills | Status: DC

## 2023-01-20 ENCOUNTER — Other Ambulatory Visit: Payer: Self-pay

## 2023-01-20 DIAGNOSIS — F1011 Alcohol abuse, in remission: Secondary | ICD-10-CM | POA: Diagnosis not present

## 2023-01-20 DIAGNOSIS — F3281 Premenstrual dysphoric disorder: Secondary | ICD-10-CM | POA: Diagnosis not present

## 2023-01-20 DIAGNOSIS — F331 Major depressive disorder, recurrent, moderate: Secondary | ICD-10-CM | POA: Diagnosis not present

## 2023-01-20 DIAGNOSIS — F4011 Social phobia, generalized: Secondary | ICD-10-CM | POA: Diagnosis not present

## 2023-01-20 MED ORDER — FLUOXETINE HCL 40 MG PO CAPS
40.0000 mg | ORAL_CAPSULE | Freq: Every day | ORAL | 0 refills | Status: DC
  Filled 2023-01-20: qty 90, 90d supply, fill #0

## 2023-01-30 DIAGNOSIS — J069 Acute upper respiratory infection, unspecified: Secondary | ICD-10-CM | POA: Insufficient documentation

## 2023-01-31 ENCOUNTER — Other Ambulatory Visit: Payer: Self-pay

## 2023-01-31 MED ORDER — BENZONATATE 200 MG PO CAPS
200.0000 mg | ORAL_CAPSULE | Freq: Three times a day (TID) | ORAL | 0 refills | Status: DC
  Filled 2023-01-31: qty 30, 10d supply, fill #0

## 2023-02-24 ENCOUNTER — Other Ambulatory Visit: Payer: Self-pay

## 2023-02-24 MED ORDER — SOLIFENACIN SUCCINATE 10 MG PO TABS
10.0000 mg | ORAL_TABLET | Freq: Every day | ORAL | 3 refills | Status: DC
  Filled 2023-02-24: qty 90, 90d supply, fill #0

## 2023-03-02 ENCOUNTER — Ambulatory Visit
Admission: EM | Admit: 2023-03-02 | Discharge: 2023-03-02 | Disposition: A | Discharge status: Home / Self Care | Payer: 59 | Attending: Emergency Medicine | Admitting: Emergency Medicine

## 2023-03-02 DIAGNOSIS — B349 Viral infection, unspecified: Secondary | ICD-10-CM

## 2023-03-02 LAB — POC COVID19/FLU A&B COMBO
Covid Antigen, POC: NEGATIVE
Influenza A Antigen, POC: NEGATIVE
Influenza B Antigen, POC: NEGATIVE

## 2023-03-02 NOTE — Discharge Instructions (Addendum)
 The COVID and flu tests are negative.   Take Tylenol or ibuprofen as needed for fever or discomfort.  Take plain Mucinex as needed for congestion.  Rest and keep yourself hydrated.    Follow-up with your primary care provider if your symptoms are not improving.

## 2023-03-02 NOTE — ED Provider Notes (Signed)
Renaldo Fiddler    CSN: [id number] Arrival date & time: 03/02/23  0825      History   Chief Complaint Chief Complaint  Patient presents with   Fever   Headache   Generalized Body Aches    HPI Brittany Good is a 37 y.o. female.  Patient presents with fever, chills, body aches, fatigue, headache x 1 to 2 days.  Tmax 101.5 this morning.  Treating with ibuprofen.  No ear pain, sore throat, cough, chest pain, shortness of breath, vomiting, diarrhea.  The history is provided by the patient and medical records.    Past Medical History:  Diagnosis Date   Acne    Depression    Miscarriage     Patient Active Problem List   Diagnosis Date Noted   MDD (major depressive disorder) 07/12/2022   PMDD (premenstrual dysphoric disorder) 07/12/2022   Social anxiety disorder 07/12/2022   Alcohol abuse, in remission 02/16/2022   Iron deficiency anemia 05/11/2021   Chronic bilateral low back pain without sciatica 05/11/2021    Past Surgical History:  Procedure Laterality Date   BREAST ENHANCEMENT SURGERY     BREAST ENHANCEMENT SURGERY     2014   DILATION AND CURETTAGE OF UTERUS     2018   DILATION AND EVACUATION N/A 12/07/2016   Procedure: DILATATION AND EVACUATION;  Surgeon: Conard Novak, MD;  Location: [facility] ORS;  Service: Gynecology;  Laterality: N/A;   LASIK     LASIK     2009   TONSILLECTOMY     1992   TONSILLECTOMY AND ADENOIDECTOMY      OB History     Gravida  4   Para  3   Term  3   Preterm      AB  1   Living  3      SAB  1   IAB      Ectopic      Multiple  0   Live Births  3            Home Medications    Prior to Admission medications   Medication Sig Start Date End Date Taking? Authorizing Provider  benzonatate (TESSALON) 200 MG capsule Take 1 capsule (200 mg total) by mouth 3 (three) times daily. Patient not taking: Reported on 03/02/2023 01/30/23     cyclobenzaprine (FLEXERIL) 10 MG tablet Take 1 tablet (10 mg  total) by mouth 3 (three) times daily as needed for muscle spasms. Patient not taking: Reported on 03/02/2023 12/25/20   Waldon Merl, PA-C  FLUoxetine (PROZAC) 40 MG capsule Take 1 capsule (40mg ) by mouth once daily. 11/22/22     FLUoxetine (PROZAC) 40 MG capsule Take 1 capsule (40 mg total) by mouth daily. 01/20/23     levonorgestrel (MIRENA) 20 MCG/24HR IUD 1 Intra Uterine Device (1 each total) by Intrauterine route once for 1 dose. 07/03/20 07/12/22  [name], CNM  levonorgestrel (MIRENA) 20 MCG/DAY IUD by Intrauterine route.    [provider]  Multiple Vitamins-Minerals (WOMENS MULTIVITAMIN PLUS PO) Take by mouth.    [provider]  naltrexone (DEPADE) 50 MG tablet Take 1 tablet (50 mg total) by mouth at bedtime. 08/19/22     naproxen (NAPROSYN) 500 MG tablet Take 1 tablet (500 mg total) by mouth 2 (two) times daily with a meal. 12/25/20   Waldon Merl, PA-C  solifenacin (VESICARE) 10 MG tablet Take 1 tablet (10 mg total) by mouth once daily 02/23/23  Family History Family History  Problem Relation Age of Onset   Alcohol abuse Brother    Cancer Maternal Grandmother        ? type   Diabetes Maternal Grandfather    Cancer Maternal Grandfather    Cancer Paternal Grandmother        lung smoker    Depression Paternal Grandfather     Social History Social History   Tobacco Use   Smoking status: Never   Smokeless tobacco: Never  Vaping Use   Vaping status: Never Used  Substance Use Topics   Alcohol use: Not Currently    Comment: , occasionally   Drug use: No     Allergies   Patient has no known allergies.   Review of Systems Review of Systems  Constitutional:  Positive for chills, fatigue and fever.  HENT:  Negative for ear pain and sore throat.   Respiratory:  Negative for cough and shortness of breath.   Cardiovascular:  Negative for chest pain and palpitations.  Gastrointestinal:  Negative for diarrhea and vomiting.     Physical  Exam Triage Vital Signs ED Triage Vitals  Encounter Vitals Group     BP 03/02/23 0858 99/65     Systolic BP Percentile --      Diastolic BP Percentile --      Pulse Rate 03/02/23 0858 92     Resp 03/02/23 0858 18     Temp 03/02/23 0858 99.5 F (37.5 C)     Temp src --      SpO2 03/02/23 0858 98 %     Weight --      Height --      Head Circumference --      Peak Flow --      Pain Score 03/02/23 0900 3     Pain Loc --      Pain Education --      Exclude from Growth Chart --    No data found.  Updated Vital Signs BP 99/65   Pulse 92   Temp 99.5 F (37.5 C)   Resp 18   LMP 02/20/2023   SpO2 98%   Visual Acuity Right Eye Distance:   Left Eye Distance:   Bilateral Distance:    Right Eye Near:   Left Eye Near:    Bilateral Near:     Physical Exam Constitutional:      General: She is not in acute distress. HENT:     Right Ear: Tympanic membrane normal.     Left Ear: Tympanic membrane normal.     Nose: Nose normal.     Mouth/Throat:     Mouth: Mucous membranes are moist.     Pharynx: Oropharynx is clear.  Cardiovascular:     Rate and Rhythm: Normal rate and regular rhythm.     Heart sounds: Normal heart sounds.  Pulmonary:     Effort: Pulmonary effort is normal. No respiratory distress.     Breath sounds: Normal breath sounds.  Skin:    General: Skin is warm and dry.  Neurological:     Mental Status: She is alert.      UC Treatments / Results  Labs (all labs ordered are listed, but only abnormal results are displayed) Labs Reviewed  POC COVID19/FLU A&B COMBO    EKG   Radiology No results found.  Procedures Procedures (including critical care time)  Medications Ordered in UC Medications - No data to display  Initial Impression / Assessment and Plan /  UC Course  I have reviewed the triage vital signs and the nursing notes.  Pertinent labs & imaging results that were available during my care of the patient were reviewed by me and considered  in my medical decision making (see chart for details).    Viral illness.  Rapid COVID and flu negative.  Discussed symptomatic treatment including Tylenol or ibuprofen as needed for fever or discomfort, plain Mucinex as needed for congestion, rest, hydration.  Instructed patient to follow-up with her PCP if not improving.  ED precautions given.  Patient agrees to plan of care.   Final Clinical Impressions(s) / UC Diagnoses   Final diagnoses:  Viral illness     Discharge Instructions      The COVID and flu tests are negative.   Take Tylenol or ibuprofen as needed for fever or discomfort.  Take plain Mucinex as needed for congestion.  Rest and keep yourself hydrated.    Follow-up with your primary care provider if your symptoms are not improving.         ED Prescriptions   None    PDMP not reviewed this encounter.   Mickie Bail, NP 03/02/23 [phone]

## 2023-03-02 NOTE — ED Triage Notes (Signed)
Patient to Urgent Care with complaints of fevers/ chills/ headaches/ generalized body aches/ fatigued.  Symptoms started two days ago. Max temp 101.5.  Meds: ibuprofen.

## 2023-03-06 ENCOUNTER — Encounter: Payer: Self-pay | Admitting: Family

## 2023-03-08 ENCOUNTER — Ambulatory Visit: Payer: 59 | Admitting: Internal Medicine

## 2023-03-08 ENCOUNTER — Ambulatory Visit (INDEPENDENT_AMBULATORY_CARE_PROVIDER_SITE_OTHER)
Admission: RE | Admit: 2023-03-08 | Discharge: 2023-03-08 | Disposition: A | Discharge status: Home / Self Care | Payer: 59 | Source: Ambulatory Visit | Attending: Internal Medicine | Admitting: Internal Medicine

## 2023-03-08 ENCOUNTER — Encounter: Payer: Self-pay | Admitting: Internal Medicine

## 2023-03-08 ENCOUNTER — Ambulatory Visit: Payer: 59

## 2023-03-08 ENCOUNTER — Other Ambulatory Visit: Payer: Self-pay

## 2023-03-08 VITALS — BP 100/58 | HR 98 | Temp 99.2°F | Ht 68.0 in | Wt 138.0 lb

## 2023-03-08 DIAGNOSIS — J22 Unspecified acute lower respiratory infection: Secondary | ICD-10-CM | POA: Insufficient documentation

## 2023-03-08 DIAGNOSIS — J984 Other disorders of lung: Secondary | ICD-10-CM | POA: Diagnosis not present

## 2023-03-08 DIAGNOSIS — J181 Lobar pneumonia, unspecified organism: Secondary | ICD-10-CM | POA: Insufficient documentation

## 2023-03-08 DIAGNOSIS — R0602 Shortness of breath: Secondary | ICD-10-CM | POA: Diagnosis not present

## 2023-03-08 DIAGNOSIS — R918 Other nonspecific abnormal finding of lung field: Secondary | ICD-10-CM | POA: Diagnosis not present

## 2023-03-08 DIAGNOSIS — R059 Cough, unspecified: Secondary | ICD-10-CM | POA: Diagnosis not present

## 2023-03-08 MED ORDER — AZITHROMYCIN 250 MG PO TABS
ORAL_TABLET | ORAL | 1 refills | Status: DC
  Filled 2023-03-08: qty 6, 5d supply, fill #0
  Filled 2023-03-11: qty 6, 5d supply, fill #1

## 2023-03-08 MED ORDER — CEFTRIAXONE SODIUM 1 G IJ SOLR
1.0000 g | Freq: Once | INTRAMUSCULAR | Status: AC
  Administered 2023-03-08: 1 g via INTRAMUSCULAR

## 2023-03-08 MED ORDER — BENZONATATE 200 MG PO CAPS
200.0000 mg | ORAL_CAPSULE | Freq: Three times a day (TID) | ORAL | 0 refills | Status: DC | PRN
  Filled 2023-03-08: qty 60, 20d supply, fill #0

## 2023-03-08 NOTE — Assessment & Plan Note (Signed)
Persistent symptoms over a week Certainly compatible with the Mycoplasma that has been around Will check CXR

## 2023-03-08 NOTE — Addendum Note (Signed)
Addended by: Eual Fines on: 03/08/2023 10:55 AM   Modules accepted: Orders

## 2023-03-08 NOTE — Progress Notes (Signed)
Subjective:    Patient ID: Brittany Good, female    DOB: 06-25-1985, 37 y.o.   MRN: [id number]  HPI Here with mom due to persistent respiratory symptoms  Started with feeling cold 8 days ago Also achy---but worsened 6 days ago--went to urgent care. Told it was viral but flu/COVID negative Mostly fever, chills, achy  Worsening since then----though fever less noticeable Having more trouble breathing and worse cough Some SOB even just with coughing Cough mostly dry  Temporal headache/frontal No ear pain Slight sore throat--from cough Has sense of popping in throat/chest--when lying down  Tylenol/motrin---some help Nyquil---not helping her much now (can't lie down comfortably)  Current Outpatient Medications on File Prior to Visit  Medication Sig Dispense Refill   FLUoxetine (PROZAC) 40 MG capsule Take 1 capsule (40mg ) by mouth once daily. 90 capsule 0   Multiple Vitamins-Minerals (WOMENS MULTIVITAMIN PLUS PO) Take by mouth.     solifenacin (VESICARE) 10 MG tablet Take 1 tablet (10 mg total) by mouth once daily 90 tablet 3   benzonatate (TESSALON) 200 MG capsule Take 1 capsule (200 mg total) by mouth 3 (three) times daily. (Patient not taking: Reported on 03/08/2023) 30 capsule 0   No current facility-administered medications on file prior to visit.    No Known Allergies  Past Medical History:  Diagnosis Date   Acne    Depression    Miscarriage     Past Surgical History:  Procedure Laterality Date   BREAST ENHANCEMENT SURGERY     BREAST ENHANCEMENT SURGERY     2014   DILATION AND CURETTAGE OF UTERUS     2018   DILATION AND EVACUATION N/A 12/07/2016   Procedure: DILATATION AND EVACUATION;  Surgeon: Conard Novak, MD;  Location: [facility] OR[facility];  Service: Gynecology;  Laterality: N/A;   LASIK     LASIK     2009   TONSILLECTOMY     1992   TONSILLECTOMY AND ADENOIDECTOMY      Family History  Problem Relation Age of Onset   Alcohol abuse Brother    Cancer  Maternal Grandmother        ? type   Diabetes Maternal Grandfather    Cancer Maternal Grandfather    Cancer Paternal Grandmother        lung smoker    Depression Paternal Grandfather     Social History   Socioeconomic History   Marital status: Married    Spouse name: Not on file   Number of children: 3   Years of education: Not on file   Highest education level: Bachelor's degree (e.g., BA, AB, BS)  Occupational History   Occupation: labor delivery nurse, RN  Tobacco Use   Smoking status: Never   Smokeless tobacco: Never  Vaping Use   Vaping status: Never Used  Substance and Sexual Activity   Alcohol use: Not Currently    Comment: , occasionally   Drug use: No   Sexual activity: Yes    Birth control/protection: I.U.D.  Other Topics Concern   Not on file  Social History Narrative   Married    2 kids girls    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 is Runner, broadcasting/film/video    Likes to read    Wears seat belt, no guns, safe in relationship    Moved from [facility] in 2017 to [facility]   Social Drivers of Health   Financial Resource Strain: Low Risk  (03/07/2023)   Overall Financial Resource Strain (CARDIA)  Difficulty of Paying Living Expenses: Not very hard  Food Insecurity: No Food Insecurity (03/07/2023)   Hunger Vital Sign    Worried About Running Out of Food in the Last Year: Never true    Ran Out of Food in the Last Year: Never true  Transportation Needs: No Transportation Needs (03/07/2023)   PRAPARE - Administrator, Civil Service (Medical): No    Lack of Transportation (Non-Medical): No  Physical Activity: Insufficiently Active (03/07/2023)   Exercise Vital Sign    Days of Exercise per Week: 4 days    Minutes of Exercise per Session: 30 min  Stress: No Stress Concern Present (03/07/2023)   [employer] of Occupational Health - Occupational Stress Questionnaire    Feeling of Stress : Not at all  Social Connections: Moderately Isolated (03/07/2023)   Social Connection and  Isolation Panel [NHANES]    Frequency of Communication with Friends and Family: More than three times a week    Frequency of Social Gatherings with Friends and Family: Once a week    Attends Religious Services: Never    Database administrator or Organizations: No    Attends Engineer, structural: Not on file    Marital Status: Married  Catering manager Violence: Not on file   Review of Systems No loss of smell or taste Some nausea---no vomiting Eating fair--not much appetite Missed work this past weekend    Objective:   Physical Exam Constitutional:      Appearance: Normal appearance.  HENT:     Head:     Comments: No sinus tenderness    Right Ear: Tympanic membrane and ear canal normal.     Left Ear: Tympanic membrane and ear canal normal.     Mouth/Throat:     Pharynx: No oropharyngeal exudate or posterior oropharyngeal erythema.  Pulmonary:     Effort: Pulmonary effort is normal.     Breath sounds: Normal breath sounds. No wheezing or rales.     Comments: Cough with deep breaths Musculoskeletal:     Cervical back: Neck supple.  Lymphadenopathy:     Cervical: No cervical adenopathy.  Neurological:     Mental Status: She is alert.            Assessment & Plan:

## 2023-03-08 NOTE — Assessment & Plan Note (Signed)
Sig LLL pneumonia but also affecting the right side Discussed alternatives of levaquin or rocephin/azithromycin Mom has concerns with the flouroquinolone so will do the alternate Benzonatate for cough analgesics

## 2023-03-10 ENCOUNTER — Encounter: Payer: Self-pay | Admitting: Internal Medicine

## 2023-03-11 ENCOUNTER — Other Ambulatory Visit: Payer: Self-pay

## 2023-03-14 ENCOUNTER — Ambulatory Visit (INDEPENDENT_AMBULATORY_CARE_PROVIDER_SITE_OTHER): Payer: 59 | Admitting: Family

## 2023-03-14 ENCOUNTER — Other Ambulatory Visit: Payer: Self-pay

## 2023-03-14 VITALS — BP 104/62 | HR 75 | Temp 97.5°F | Ht 68.0 in | Wt 136.0 lb

## 2023-03-14 DIAGNOSIS — R0602 Shortness of breath: Secondary | ICD-10-CM

## 2023-03-14 DIAGNOSIS — B3731 Acute candidiasis of vulva and vagina: Secondary | ICD-10-CM | POA: Diagnosis not present

## 2023-03-14 DIAGNOSIS — J189 Pneumonia, unspecified organism: Secondary | ICD-10-CM | POA: Diagnosis not present

## 2023-03-14 MED ORDER — PREDNISONE 10 MG PO TABS
ORAL_TABLET | ORAL | 0 refills | Status: AC
  Filled 2023-03-14: qty 21, 6d supply, fill #0

## 2023-03-14 MED ORDER — FLUCONAZOLE 150 MG PO TABS
ORAL_TABLET | ORAL | 0 refills | Status: AC
  Filled 2023-03-14: qty 2, 3d supply, fill #0

## 2023-03-14 MED ORDER — ALBUTEROL SULFATE HFA 108 (90 BASE) MCG/ACT IN AERS
2.0000 | INHALATION_SPRAY | Freq: Four times a day (QID) | RESPIRATORY_TRACT | 0 refills | Status: DC | PRN
  Filled 2023-03-14: qty 6.7, 25d supply, fill #0

## 2023-03-14 MED ORDER — DOXYCYCLINE HYCLATE 100 MG PO TABS
100.0000 mg | ORAL_TABLET | Freq: Two times a day (BID) | ORAL | 0 refills | Status: AC
  Filled 2023-03-14: qty 20, 10d supply, fill #0

## 2023-03-14 NOTE — Assessment & Plan Note (Signed)
Significant crackles remain left lower and mid lobe Start doxycycline 100 mg po bid x 10 days Has completed zpack and 1g ceftriaxone.  Prednisone pack given for RX  Continue mucinex.  Albuterol prn sob  Cxr repeat in three weeks.  F/u in ten days to clinic. Report red flag symptoms, d/w pt

## 2023-03-14 NOTE — Progress Notes (Signed)
Established Patient Office Visit  Subjective:   Patient ID: Brittany Good, female    DOB: 27-Sep-1985  Age: 37 y.o. MRN: [id number]  CC:  Chief Complaint  Patient presents with   Medical Management of Chronic Issues    Some improvement from symptoms     HPI: Brittany Good is a 37 y.o. female presenting on 03/14/2023 for Medical Management of Chronic Issues (Some improvement from symptoms )  12/24 was seen in office for persistent respiratory symptoms with sob and worsening cough with some sob but mainly with cough. With sinus headache, slight sore throat. Cxr was ordered. Given ceftriaxone in office 1 g followed by zpack and benzonatate for cough prn. Had been to urgent care 12/18 flu and covid negative. Discharged without medications however had progressed so then saw the primary in our office.   CXR 12/24 with extensive pneumonia mid and lower left lung as well as slight in right upper lobe. Also suspected left pleural effusion.   Today she states with definite improvement but still with night sweats and doe however no fevers. She felt something 'pop' yesterday when she was coughing. Hard to cough because it hurts her a lot to cough. Slight sore throat but really feels from coughing. Is using mucinex cough drops and using humidifier at night and tea with mild relief.        ROS: Negative unless specifically indicated above in HPI.   Relevant past medical history reviewed and updated as indicated.   Allergies and medications reviewed and updated.   Current Outpatient Medications:    albuterol (VENTOLIN HFA) 108 (90 Base) MCG/ACT inhaler, Inhale 2 puffs into the lungs every 6 (six) hours as needed for wheezing or shortness of breath., Disp: 6.7 g, Rfl: 0   benzonatate (TESSALON) 200 MG capsule, Take 1 capsule (200 mg total) by mouth 3 (three) times daily as needed for cough., Disp: 60 capsule, Rfl: 0   doxycycline (VIBRA-TABS) 100 MG tablet, Take 1 tablet (100 mg total)  by mouth 2 (two) times daily for 10 days., Disp: 20 tablet, Rfl: 0   fluconazole (DIFLUCAN) 150 MG tablet, Take 1 tablet by mouth once, repeat in three days if still with symptoms, Disp: 2 tablet, Rfl: 0   FLUoxetine (PROZAC) 40 MG capsule, Take 1 capsule (40mg ) by mouth once daily., Disp: 90 capsule, Rfl: 0   Multiple Vitamins-Minerals (WOMENS MULTIVITAMIN PLUS PO), Take by mouth., Disp: , Rfl:    predniSONE (DELTASONE) 10 MG tablet, 6 tablets (60 mg total) daily for 1 day, THEN 5 tablets (50 mg total) daily for 1 day, THEN 4 tablets (40 mg total) daily for 1 day, THEN 3 tablets (30 mg total) daily for 1 day, THEN 2 tablets (20 mg total) daily for 1 day, THEN 1 tablet (10 mg total) daily for 1 day., Disp: 21 tablet, Rfl: 0  No Known Allergies  Objective:   BP 104/62   Pulse 75   Temp (!) 97.5 F (36.4 C) (Temporal)   Ht 5\' 8"  (1.727 m)   Wt 136 lb (61.7 kg)   LMP 02/20/2023   SpO2 94%   BMI 20.68 kg/m    Physical Exam Constitutional:      General: She is not in acute distress.    Appearance: Normal appearance. She is normal weight. She is not ill-appearing, toxic-appearing or diaphoretic.  HENT:     Head: Normocephalic.     Right Ear: Tympanic membrane normal.     Left Ear:  Tympanic membrane normal.     Nose: Nose normal.     Mouth/Throat:     Mouth: Mucous membranes are dry.     Pharynx: No oropharyngeal exudate or posterior oropharyngeal erythema.  Eyes:     Extraocular Movements: Extraocular movements intact.     Pupils: Pupils are equal, round, and reactive to light.  Cardiovascular:     Rate and Rhythm: Normal rate and regular rhythm.     Pulses: Normal pulses.     Heart sounds: Normal heart sounds.  Pulmonary:     Effort: Pulmonary effort is normal.     Breath sounds: Examination of the left-middle field reveals rales. Examination of the left-lower field reveals rales. Rales present.  Musculoskeletal:     Cervical back: Normal range of motion.  Neurological:      General: No focal deficit present.     Mental Status: She is alert and oriented to person, place, and time. Mental status is at baseline.  Psychiatric:        Mood and Affect: Mood normal.        Behavior: Behavior normal.        Thought Content: Thought content normal.        Judgment: Judgment normal.     Assessment & Plan:  Vaginal candida -     Fluconazole; Take 1 tablet by mouth once, repeat in three days if still with symptoms  Dispense: 2 tablet; Refill: 0  Pneumonia of both lower lobes due to infectious organism Assessment & Plan: Significant crackles remain left lower and mid lobe Start doxycycline 100 mg po bid x 10 days Has completed zpack and 1g ceftriaxone.  Prednisone pack given for RX  Continue mucinex.  Albuterol prn sob  Cxr repeat in three weeks.  F/u in ten days to clinic. Report red flag symptoms, d/w pt  Orders: -     Albuterol Sulfate HFA; Inhale 2 puffs into the lungs every 6 (six) hours as needed for wheezing or shortness of breath.  Dispense: 6.7 g; Refill: 0 -     Doxycycline Hyclate; Take 1 tablet (100 mg total) by mouth 2 (two) times daily for 10 days.  Dispense: 20 tablet; Refill: 0 -     predniSONE; 6 tablets (60 mg total) daily for 1 day, THEN 5 tablets (50 mg total) daily for 1 day, THEN 4 tablets (40 mg total) daily for 1 day, THEN 3 tablets (30 mg total) daily for 1 day, THEN 2 tablets (20 mg total) daily for 1 day, THEN 1 tablet (10 mg total) daily for 1 day.  Dispense: 21 tablet; Refill: 0  SOB (shortness of breath) -     Albuterol Sulfate HFA; Inhale 2 puffs into the lungs every 6 (six) hours as needed for wheezing or shortness of breath.  Dispense: 6.7 g; Refill: 0     Follow up plan: Return in about 10 days (around 03/24/2023) for follow up pneumonia.  Mort Sawyers, FNP

## 2023-03-22 ENCOUNTER — Encounter: Payer: Self-pay | Admitting: Family

## 2023-03-24 ENCOUNTER — Other Ambulatory Visit: Payer: Self-pay

## 2023-03-24 ENCOUNTER — Ambulatory Visit: Payer: 59 | Admitting: Family

## 2023-03-24 ENCOUNTER — Encounter: Payer: Self-pay | Admitting: Family

## 2023-03-24 VITALS — BP 100/70 | HR 73 | Temp 98.2°F | Ht 68.0 in | Wt 129.2 lb

## 2023-03-24 DIAGNOSIS — R0609 Other forms of dyspnea: Secondary | ICD-10-CM

## 2023-03-24 DIAGNOSIS — J189 Pneumonia, unspecified organism: Secondary | ICD-10-CM | POA: Diagnosis not present

## 2023-03-24 MED ORDER — PREDNISONE 10 MG PO TABS
ORAL_TABLET | ORAL | 0 refills | Status: DC
  Filled 2023-03-24: qty 21, 6d supply, fill #0

## 2023-03-24 MED ORDER — FLUTICASONE-SALMETEROL 250-50 MCG/ACT IN AEPB
1.0000 | INHALATION_SPRAY | Freq: Two times a day (BID) | RESPIRATORY_TRACT | 0 refills | Status: DC
  Filled 2023-03-24: qty 60, 30d supply, fill #0

## 2023-03-24 NOTE — Assessment & Plan Note (Addendum)
 Improvement on physical exam.  Completed doxycycline , rocephin  and zpack.  Completed one pack prednisone , will send in another rx today as helped with symptoms.  Due to doe and sob will send in advair  250/50 mcg twice daily x 2 weeks.  Repeat cxr x two weeks.   Advised patient on supportive measures:  Be sure to rest, drink plenty of fluids, and use tylenol  or ibuprofen  as needed for pain. Follow up if fever >101, if symptoms worsen or if symptoms are not improving. Patient verbalizes understanding.   Would also benefit from zyrtec or xyzal for fluid in ears.

## 2023-03-24 NOTE — Telephone Encounter (Signed)
 Saw patient already, thank you for speaking with the patient.  Please see note for further information.

## 2023-03-24 NOTE — Patient Instructions (Signed)
  Another prednisone burst  Start advair  Start nightly zyrtec or xyzal

## 2023-03-24 NOTE — Progress Notes (Signed)
 Established Patient Office Visit  Subjective:   Patient ID: Brittany Good, female    DOB: [birth year]  Age: 38 y.o. MRN: [id number]  CC:  Chief Complaint  Patient presents with   Medical Management of Chronic Issues    Follow up pneumonia     HPI: Brittany Good is a 38 y.o. female presenting on 03/24/2023 for Medical Management of Chronic Issues (Follow up pneumonia )  Pneumonia, started last visit on doxycycline  100 mg po bid x 10 days which she completed yesterday.  Prior had completed zpack and 1 g ceftriaxone .  Taking mucinex and tessalon  perrles. Albuterol  use has been about once daily. Due for repeat CXR in two weeks.   Still hard for her to take a deep breath, sob pretty easy with exertion. Now with more sinus pressure. She does state prednisone  did help her feel a bit better. No fever.    Completed prednisone .        ROS: Negative unless specifically indicated above in HPI.   Relevant past medical history reviewed and updated as indicated.   Allergies and medications reviewed and updated.   Current Outpatient Medications:    albuterol  (VENTOLIN  HFA) 108 (90 Base) MCG/ACT inhaler, Inhale 2 puffs into the lungs every 6 (six) hours as needed for wheezing or shortness of breath., Disp: 6.7 g, Rfl: 0   benzonatate  (TESSALON ) 200 MG capsule, Take 1 capsule (200 mg total) by mouth 3 (three) times daily as needed for cough., Disp: 60 capsule, Rfl: 0   doxycycline  (VIBRA -TABS) 100 MG tablet, Take 1 tablet (100 mg total) by mouth 2 (two) times daily for 10 days., Disp: 20 tablet, Rfl: 0   FLUoxetine  (PROZAC ) 40 MG capsule, Take 1 capsule (40mg ) by mouth once daily., Disp: 90 capsule, Rfl: 0   fluticasone -salmeterol (ADVAIR  DISKUS) 250-50 MCG/ACT AEPB, Inhale 1 puff into the lungs in the morning and at bedtime., Disp: 60 each, Rfl: 0   Multiple Vitamins-Minerals (WOMENS MULTIVITAMIN PLUS PO), Take by mouth., Disp: , Rfl:    predniSONE  (DELTASONE ) 10 MG tablet, Take 6  tablets (60 mg total) by mouth daily AND 5 tablets (50 mg total) daily AND 4 tablets (40 mg total) daily AND 3 tablets (30 mg total) daily AND 2 tablets (20 mg total) daily AND 1 tablet (10 mg total) daily., Disp: 21 tablet, Rfl: 0  No Known Allergies  Objective:   BP 100/70 (BP Location: Left Arm, Patient Position: Sitting, Cuff Size: Normal)   Pulse 73   Temp 98.2 F (36.8 C) (Temporal)   Ht 5' 8 (1.727 m)   Wt 129 lb 3.2 oz (58.6 kg)   LMP 02/20/2023   SpO2 99%   BMI 19.64 kg/m    Physical Exam Constitutional:      General: She is not in acute distress.    Appearance: Normal appearance. She is normal weight. She is not ill-appearing, toxic-appearing or diaphoretic.  HENT:     Head: Normocephalic.     Right Ear: A middle ear effusion is present. Tympanic membrane is retracted.     Left Ear: A middle ear effusion is present. Tympanic membrane is retracted.     Nose: Nose normal.     Mouth/Throat:     Mouth: Mucous membranes are dry.     Pharynx: No oropharyngeal exudate or posterior oropharyngeal erythema.  Eyes:     Extraocular Movements: Extraocular movements intact.     Pupils: Pupils are equal, round, and reactive to light.  Cardiovascular:  Rate and Rhythm: Normal rate and regular rhythm.     Pulses: Normal pulses.     Heart sounds: Normal heart sounds.  Pulmonary:     Effort: Pulmonary effort is normal.     Breath sounds: Normal breath sounds.  Musculoskeletal:     Cervical back: Normal range of motion.  Neurological:     General: No focal deficit present.     Mental Status: She is alert and oriented to person, place, and time. Mental status is at baseline.  Psychiatric:        Mood and Affect: Mood normal.        Behavior: Behavior normal.        Thought Content: Thought content normal.        Judgment: Judgment normal.     Assessment & Plan:  Pneumonia of both lower lobes due to infectious organism Assessment & Plan: Improvement on physical exam.   Completed doxycycline , rocephin  and zpack.  Completed one pack prednisone , will send in another rx today as helped with symptoms.  Due to doe and sob will send in advair  250/50 mcg twice daily x 2 weeks.  Repeat cxr x two weeks.   Advised patient on supportive measures:  Be sure to rest, drink plenty of fluids, and use tylenol  or ibuprofen  as needed for pain. Follow up if fever >101, if symptoms worsen or if symptoms are not improving. Patient verbalizes understanding.   Would also benefit from zyrtec or xyzal for fluid in ears.    Orders: -     predniSONE ; Take 6 tablets (60 mg total) by mouth daily AND 5 tablets (50 mg total) daily AND 4 tablets (40 mg total) daily AND 3 tablets (30 mg total) daily AND 2 tablets (20 mg total) daily AND 1 tablet (10 mg total) daily.  Dispense: 21 tablet; Refill: 0  DOE (dyspnea on exertion) -     predniSONE ; Take 6 tablets (60 mg total) by mouth daily AND 5 tablets (50 mg total) daily AND 4 tablets (40 mg total) daily AND 3 tablets (30 mg total) daily AND 2 tablets (20 mg total) daily AND 1 tablet (10 mg total) daily.  Dispense: 21 tablet; Refill: 0 -     Fluticasone -Salmeterol; Inhale 1 puff into the lungs in the morning and at bedtime.  Dispense: 60 each; Refill: 0     Follow up plan: Return if symptoms worsen or fail to improve.  Ginger Patrick, FNP

## 2023-04-07 ENCOUNTER — Ambulatory Visit (INDEPENDENT_AMBULATORY_CARE_PROVIDER_SITE_OTHER)
Admission: RE | Admit: 2023-04-07 | Discharge: 2023-04-07 | Disposition: A | Discharge status: Home / Self Care | Payer: 59 | Source: Ambulatory Visit | Attending: Internal Medicine | Admitting: Internal Medicine

## 2023-04-07 DIAGNOSIS — J181 Lobar pneumonia, unspecified organism: Secondary | ICD-10-CM

## 2023-04-07 DIAGNOSIS — J189 Pneumonia, unspecified organism: Secondary | ICD-10-CM | POA: Diagnosis not present

## 2023-04-08 ENCOUNTER — Ambulatory Visit: Payer: 59 | Admitting: Family

## 2023-04-11 ENCOUNTER — Encounter: Payer: Self-pay | Admitting: Internal Medicine

## 2023-04-15 ENCOUNTER — Other Ambulatory Visit: Payer: Self-pay

## 2023-04-15 ENCOUNTER — Telehealth: Payer: 59 | Admitting: Physician Assistant

## 2023-04-15 DIAGNOSIS — T300 Burn of unspecified body region, unspecified degree: Secondary | ICD-10-CM | POA: Diagnosis not present

## 2023-04-15 MED ORDER — MUPIROCIN 2 % EX OINT
1.0000 | TOPICAL_OINTMENT | Freq: Two times a day (BID) | CUTANEOUS | 0 refills | Status: DC
  Filled 2023-04-15: qty 22, 30d supply, fill #0

## 2023-04-15 NOTE — Progress Notes (Signed)
 E-Visit for Burn  We are sorry that you are not feeling well. Here is how we plan to help!  Based on what you have shared with me it looks like you may have:  2nd degree burn with or without blisters.   Second-degree burns take 14-21 days to heal.  After the burn has healed the skin may look a little darker or lighter than before., I have prescribed Mupirocin 2% ointment.  Apply with gloves to the affected area two times per day for 14 days.  Apply a non-stick dressing such as Telfa to the site after you apply the cream.  You may hold the dressing in place with either paper tape or self adhering wrap such as Coban.  Product similar to Telfa and Coban can be bought at any pharmacy.  If you have a question ask your pharmacist.  Lawerance Bach are a type of painful wound caused by thermal, electrical, chemical, or electromagnetic energy.  Smoking and open flame are the leading cause of burn injury for older adults.  Scalding from a hot liquid is the leading cause of burn injury for children.  Both infants and older adults are the greatest risk for burn injury.  First degree burns effect only the outer layers of the skin.  The burn may be red and painful but the skin does not blister.  Long term tissue damage is rare.  Second degree burns involve the surface of the skin and the adjacent skin layers.  The burn sire also appears red and painful and the skin often swells and/or blisters.  Third degree burns destroy both layers of the skin and can also penetrate to underlying  Structures.  A third degree burn may not initially hurt because nerve endings were destroyed.  All third degree burns should be evaluated in person.  HOME CARE:  Wash the area gently with soap and water once a day Apply antibiotic ointment directly to a Band-Aid or dressing and apply Band-Aid or dressing over the burn.  Change dressing every other day.  Use warm water and 1 or 2 wipes with a wet washcloth to remove any surface debris.  Some  of the newer antibiotic ointments contain lidocaine that can help to control the localized pain of the burn. You should leave intact blisters alone for the first 7 days.  After a week you may gently remove blisters.  The easiest way to do this is gently wipe away the dead skin with wet gauze or wet washcloth. If that fails you may carefully trim off the dead skin with a pair of fine scissors.  Be sure to clean the scissors in alcohol before use.  GET HELP RIGHT AWAY IF:  The area of the burn is larger than 4 palms of our hand. You become short of breath. The site looks infected. Your symptoms persist after you have completed your treatment plan. The burn has not healed in 14 days.   MAKE SURE YOU:  Understand these instructions. Will watch your condition. Will get help right away if you are not doing well or get worse.  Your e-visit answers were reviewed by a board certified advanced clinical practitioner to complete your personal care plan.  Depending upon the condition, your plan could have included both over the counter or prescription medications.    Please review your pharmacy choice.  Make sure the pharmacy is open so you can pick up prescription now.   If there is a problem, you may contact your  provider through [employer] and have the prescription routed to another pharmacy. Your safety is important to [place].  If you have drug allergies check your prescription carefully.    For the next 24 hours you can use MyChart to ask questions about today's visit, request a non-urgent call back, or ask for a work or school excuse.  You will get an email with a link to our survey asking about your experience.  I hope that your e-visit has been valuable and will speed your recovery.     Thank you for using e-Visits.      I have spent 5 minutes in review of e-visit questionnaire, review and updating patient chart, medical decision making and response to patient.   Margaretann Loveless,  PA-C

## 2023-04-19 ENCOUNTER — Other Ambulatory Visit: Payer: Self-pay

## 2023-04-19 DIAGNOSIS — F331 Major depressive disorder, recurrent, moderate: Secondary | ICD-10-CM | POA: Diagnosis not present

## 2023-04-19 DIAGNOSIS — F4011 Social phobia, generalized: Secondary | ICD-10-CM | POA: Diagnosis not present

## 2023-04-19 DIAGNOSIS — F3281 Premenstrual dysphoric disorder: Secondary | ICD-10-CM | POA: Diagnosis not present

## 2023-04-19 DIAGNOSIS — F1011 Alcohol abuse, in remission: Secondary | ICD-10-CM | POA: Diagnosis not present

## 2023-04-19 MED ORDER — FLUOXETINE HCL 40 MG PO CAPS
40.0000 mg | ORAL_CAPSULE | Freq: Every day | ORAL | 1 refills | Status: DC
  Filled 2023-04-19: qty 90, 90d supply, fill #0
  Filled 2023-07-19: qty 90, 90d supply, fill #1

## 2023-04-22 ENCOUNTER — Other Ambulatory Visit: Payer: Self-pay

## 2023-08-22 ENCOUNTER — Other Ambulatory Visit: Payer: Self-pay

## 2023-08-22 DIAGNOSIS — F331 Major depressive disorder, recurrent, moderate: Secondary | ICD-10-CM | POA: Diagnosis not present

## 2023-08-22 DIAGNOSIS — Z1231 Encounter for screening mammogram for malignant neoplasm of breast: Secondary | ICD-10-CM | POA: Diagnosis not present

## 2023-08-22 DIAGNOSIS — Z01419 Encounter for gynecological examination (general) (routine) without abnormal findings: Secondary | ICD-10-CM | POA: Diagnosis not present

## 2023-08-22 DIAGNOSIS — Z1331 Encounter for screening for depression: Secondary | ICD-10-CM | POA: Diagnosis not present

## 2023-08-22 DIAGNOSIS — F4011 Social phobia, generalized: Secondary | ICD-10-CM | POA: Diagnosis not present

## 2023-08-22 DIAGNOSIS — F1011 Alcohol abuse, in remission: Secondary | ICD-10-CM | POA: Diagnosis not present

## 2023-08-22 DIAGNOSIS — F3281 Premenstrual dysphoric disorder: Secondary | ICD-10-CM | POA: Diagnosis not present

## 2023-08-22 MED ORDER — BUSPIRONE HCL 10 MG PO TABS
10.0000 mg | ORAL_TABLET | Freq: Every day | ORAL | 0 refills | Status: DC
  Filled 2023-08-22: qty 90, 90d supply, fill #0

## 2023-08-22 MED ORDER — FLUOXETINE HCL 40 MG PO CAPS
40.0000 mg | ORAL_CAPSULE | Freq: Every day | ORAL | 1 refills | Status: DC
  Filled 2023-10-17: qty 90, 90d supply, fill #0

## 2023-08-30 ENCOUNTER — Other Ambulatory Visit: Payer: Self-pay | Admitting: Obstetrics

## 2023-08-30 DIAGNOSIS — Z1231 Encounter for screening mammogram for malignant neoplasm of breast: Secondary | ICD-10-CM

## 2023-09-13 ENCOUNTER — Ambulatory Visit
Admission: RE | Admit: 2023-09-13 | Discharge: 2023-09-13 | Disposition: A | Discharge status: Home / Self Care | Source: Ambulatory Visit | Attending: Obstetrics | Admitting: Obstetrics

## 2023-09-13 DIAGNOSIS — Z1231 Encounter for screening mammogram for malignant neoplasm of breast: Secondary | ICD-10-CM | POA: Diagnosis not present

## 2023-09-19 ENCOUNTER — Other Ambulatory Visit: Payer: Self-pay | Admitting: Obstetrics

## 2023-09-19 DIAGNOSIS — R928 Other abnormal and inconclusive findings on diagnostic imaging of breast: Secondary | ICD-10-CM

## 2023-09-20 ENCOUNTER — Ambulatory Visit
Admission: RE | Admit: 2023-09-20 | Discharge: 2023-09-20 | Disposition: A | Discharge status: Home / Self Care | Source: Ambulatory Visit | Attending: Obstetrics | Admitting: Obstetrics

## 2023-09-20 DIAGNOSIS — R928 Other abnormal and inconclusive findings on diagnostic imaging of breast: Secondary | ICD-10-CM | POA: Insufficient documentation

## 2023-09-20 DIAGNOSIS — R92343 Mammographic extreme density, bilateral breasts: Secondary | ICD-10-CM | POA: Diagnosis not present

## 2023-10-17 ENCOUNTER — Other Ambulatory Visit: Payer: Self-pay

## 2023-10-18 ENCOUNTER — Encounter: Payer: Self-pay | Admitting: Dermatology

## 2023-10-18 ENCOUNTER — Ambulatory Visit: Admitting: Dermatology

## 2023-10-18 DIAGNOSIS — L720 Epidermal cyst: Secondary | ICD-10-CM | POA: Diagnosis not present

## 2023-10-18 DIAGNOSIS — Z7189 Other specified counseling: Secondary | ICD-10-CM

## 2023-10-18 DIAGNOSIS — L820 Inflamed seborrheic keratosis: Secondary | ICD-10-CM

## 2023-10-18 DIAGNOSIS — L729 Follicular cyst of the skin and subcutaneous tissue, unspecified: Secondary | ICD-10-CM

## 2023-10-18 DIAGNOSIS — L821 Other seborrheic keratosis: Secondary | ICD-10-CM | POA: Diagnosis not present

## 2023-10-18 NOTE — Patient Instructions (Addendum)

## 2023-10-18 NOTE — Progress Notes (Signed)
   Follow-Up Visit   Subjective  Brittany Good is a 38 y.o. female who presents for the following: The patient has spots on her to neck to be evaluated, some may be new or changing and the patient may have concern these could be cancer.   The following portions of the chart were reviewed this encounter and updated as appropriate: medications, allergies, medical history  Review of Systems:  No other skin or systemic complaints except as noted in HPI or Assessment and Plan.  Objective  Well appearing patient in no apparent distress; mood and affect are within normal limits.  A focused examination was performed of the following areas: Face,neck,back   Relevant exam findings are noted in the Assessment and Plan.  Neck - Anterior x 7, back x 17 (24) Stuck-on, waxy, tan-brown papules and plaques -- Discussed benign etiology and prognosis.   Assessment & Plan   SEBORRHEIC KERATOSIS - Stuck-on, waxy, tan-brown papules and/or plaques  - Benign-appearing - Discussed benign etiology and prognosis. - Observe - Call for any changes  EPIDERMAL INCLUSION CYST Exam: #2  ~1 cm Subcutaneous nodule on the left medial scapular and #3 Subcutaneous nodule on the low bacl Benign-appearing. Exam most consistent with an epidermal inclusion cyst. Discussed that a cyst is a benign growth that can grow over time and sometimes get irritated or inflamed. Recommend observation if it is not bothersome. Discussed option of surgical excision to remove it if it is growing, symptomatic, or other changes noted. Please call for new or changing lesions so they can be evaluated.  INFLAMED SEBORRHEIC KERATOSIS (24) Neck - Anterior x 7, back x 17 (24) Symptomatic, irritating, patient would like treated.   Recheck areas treated on the neck in 10 weeks  Destruction of lesion - Neck - Anterior x 7, back x 17 (24) Complexity: simple   Destruction method: cryotherapy   Informed consent: discussed and consent  obtained   Timeout:  patient name, date of birth, surgical site, and procedure verified Lesion destroyed using liquid nitrogen: Yes   Region frozen until ice ball extended beyond lesion: Yes   Outcome: patient tolerated procedure well with no complications   Post-procedure details: wound care instructions given     Return in about 10 weeks (around 12/27/2023) for ISKs .  IFay Kirks, CMA, am acting as scribe for Alm Rhyme, MD .   Documentation: I have reviewed the above documentation for accuracy and completeness, and I agree with the above.  Alm Rhyme, MD

## 2023-10-22 ENCOUNTER — Encounter: Payer: Self-pay | Admitting: Family

## 2023-11-01 ENCOUNTER — Ambulatory Visit (INDEPENDENT_AMBULATORY_CARE_PROVIDER_SITE_OTHER): Admitting: Family

## 2023-11-01 ENCOUNTER — Encounter: Payer: Self-pay | Admitting: Family

## 2023-11-01 VITALS — BP 112/66 | HR 78 | Temp 98.3°F | Ht 67.0 in | Wt 138.0 lb

## 2023-11-01 DIAGNOSIS — Z0000 Encounter for general adult medical examination without abnormal findings: Secondary | ICD-10-CM

## 2023-11-01 DIAGNOSIS — N3941 Urge incontinence: Secondary | ICD-10-CM | POA: Diagnosis not present

## 2023-11-01 DIAGNOSIS — R5383 Other fatigue: Secondary | ICD-10-CM

## 2023-11-01 DIAGNOSIS — Z124 Encounter for screening for malignant neoplasm of cervix: Secondary | ICD-10-CM

## 2023-11-01 DIAGNOSIS — L659 Nonscarring hair loss, unspecified: Secondary | ICD-10-CM | POA: Insufficient documentation

## 2023-11-01 LAB — IBC + FERRITIN
Ferritin: 3 ng/mL — ABNORMAL LOW (ref 10.0–291.0)
Iron: 34 ug/dL — ABNORMAL LOW (ref 42–145)
Saturation Ratios: 8.7 % — ABNORMAL LOW (ref 20.0–50.0)
TIBC: 392 ug/dL (ref 250.0–450.0)
Transferrin: 280 mg/dL (ref 212.0–360.0)

## 2023-11-01 LAB — CBC
HCT: 33.6 % — ABNORMAL LOW (ref 36.0–46.0)
Hemoglobin: 10.9 g/dL — ABNORMAL LOW (ref 12.0–15.0)
MCHC: 32.5 g/dL (ref 30.0–36.0)
MCV: 83 fl (ref 78.0–100.0)
Platelets: 247 K/uL (ref 150.0–400.0)
RBC: 4.04 Mil/uL (ref 3.87–5.11)
RDW: 16.1 % — ABNORMAL HIGH (ref 11.5–15.5)
WBC: 5.2 K/uL (ref 4.0–10.5)

## 2023-11-01 LAB — BASIC METABOLIC PANEL WITH GFR
BUN: 21 mg/dL (ref 6–23)
CO2: 29 meq/L (ref 19–32)
Calcium: 9.5 mg/dL (ref 8.4–10.5)
Chloride: 100 meq/L (ref 96–112)
Creatinine, Ser: 0.8 mg/dL (ref 0.40–1.20)
GFR: 92.33 mL/min (ref >60.00)
Glucose, Bld: 90 mg/dL (ref 70–99)
Potassium: 4.2 meq/L (ref 3.5–5.1)
Sodium: 136 meq/L (ref 135–145)

## 2023-11-01 LAB — VITAMIN B12: Vitamin B-12: 1038 pg/mL — ABNORMAL HIGH (ref 211–911)

## 2023-11-01 LAB — T4, FREE: Free T4: 0.74 ng/dL (ref 0.60–1.60)

## 2023-11-01 LAB — TSH: TSH: 1.43 u[IU]/mL (ref 0.35–5.50)

## 2023-11-01 LAB — T3, FREE: T3, Free: 2.7 pg/mL (ref 2.3–4.2)

## 2023-11-01 NOTE — Progress Notes (Signed)
 Subjective:  Patient ID: Brittany Good, female    DOB: 1985-12-19  Age: 38 y.o. MRN: [id number]  Patient Care Team: Corwin Antu, FNP as PCP - General (Family Medicine)   CC:  Chief Complaint  Patient presents with   Annual Exam    HPI Brittany Good is a 38 y.o. female who presents today for an annual physical exam. She reports consuming a general diet. The patient does not participate in regular exercise at present. She generally feels well. She reports sleeping well. She does have additional problems to discuss today.   Vision:Within last year Dental:Receives regular dental care  Mammogram: 09/19/24 annual screening Last pap: 2023  Pt is with acute concerns.   Discussed the use of AI scribe software for clinical note transcription with the patient, who gave verbal consent to proceed.  History of Present Illness Brittany Good is a 38 year old female who presents with concerns of fatigue and hair loss.  She experiences significant hair loss, noticing 'handfuls of hair' coming out while washing her hair in the shower. She recently had a haircut and started using red onion shampoo, but is unsure if these measures have helped.  She reports persistent fatigue that has not improved despite changing to a day shift. She expected to feel better with these changes but has not noticed a significant difference. She sleeps well at night, generally not waking up except once or twice to urinate. No frequent awakenings throughout the night.  She experiences urinary urgency and has had episodes of incontinence over the past year. She was previously prescribed Vesicare  by her gynecologist, which she took for four days before stopping due to side effects, including dryness. She has not resumed this medication. She describes the urgency as not feeling the need to urinate until it becomes immediate.     Advanced Directives Patient does not have advanced directives       DEPRESSION SCREENING    11/01/2023   10:42 AM 07/12/2022    7:25 AM 03/22/2022    9:11 AM 01/19/2022   11:50 AM 12/10/2019    4:08 PM 06/29/2017    8:33 AM  PHQ 2/9 Scores  PHQ - 2 Score 2 0 0 2 0 0  PHQ- 9 Score 8  2 7 2       ROS: Negative unless specifically indicated above in HPI.    Current Outpatient Medications:    busPIRone  (BUSPAR ) 10 MG tablet, Take 1 tablet (10 mg total) by mouth daily., Disp: 90 tablet, Rfl: 0   FLUoxetine  (PROZAC ) 40 MG capsule, Take 1 capsule (40 mg total) by mouth daily., Disp: 90 capsule, Rfl: 1   Multiple Vitamins-Minerals (WOMENS MULTIVITAMIN PLUS PO), Take by mouth., Disp: , Rfl:     Objective:    BP 112/66   Pulse 78   Temp 98.3 F (36.8 C) (Oral)   Ht 5' 7 (1.702 m)   Wt 138 lb (62.6 kg)   LMP 10/09/2023   SpO2 99%   BMI 21.61 kg/m   BP Readings from Last 3 Encounters:  11/01/23 112/66  03/24/23 100/70  03/14/23 104/62      Physical Exam Vitals reviewed.  Constitutional:      General: She is not in acute distress.    Appearance: Normal appearance. She is normal weight. She is not ill-appearing.  HENT:     Head: Normocephalic.     Right Ear: Tympanic membrane normal.     Left Ear: Tympanic membrane normal.  Nose: Nose normal.     Mouth/Throat:     Mouth: Mucous membranes are moist.  Eyes:     Extraocular Movements: Extraocular movements intact.     Pupils: Pupils are equal, round, and reactive to light.  Cardiovascular:     Rate and Rhythm: Normal rate and regular rhythm.  Pulmonary:     Effort: Pulmonary effort is normal.     Breath sounds: Normal breath sounds.  Abdominal:     General: Abdomen is flat. Bowel sounds are normal.     Palpations: Abdomen is soft.     Tenderness: There is no guarding or rebound.  Musculoskeletal:        General: Normal range of motion.     Cervical back: Normal range of motion.  Skin:    General: Skin is warm.     Capillary Refill: Capillary refill takes less than 2  seconds.  Neurological:     General: No focal deficit present.     Mental Status: She is alert.  Psychiatric:        Mood and Affect: Mood normal.        Behavior: Behavior normal.        Thought Content: Thought content normal.        Judgment: Judgment normal.       Results       Assessment & Plan:   Assessment and Plan Assessment & Plan Adult Wellness Visit Patient Counseling(The following topics were reviewed):  Preventative care handout given to pt  Health maintenance and immunizations reviewed. Please refer to Health maintenance section. Pt advised on safe sex, wearing seatbelts in car, and proper nutrition labwork ordered today for annual Dental health: Discussed importance of regular tooth brushing, flossing, and dental visits.   Urge incontinence Urge incontinence with symptoms of urgency and occasional leakage. Previous trial of Vesicare  was discontinued due to side effects, including dryness. No pelvic floor therapy attempted. Gynecological exam did not indicate prolapse. - Consider referral for pelvic floor therapy.  Nonscarring hair loss Nonscarring hair loss, previously significant but improved after haircut. Possible causes include thyroid  issues and aging. No definitive cause identified. Various shampoos tried with uncertain efficacy. - Consider thyroid  function tests. R/o IDA with cbc  Fatigue Persistent fatigue despite lifestyle changes, including shift change. Sleep quality reported as good. Possible thyroid  dysfunction considered as a contributing factor. - ordering thyroid  panel, cbc ibc ferritin b12 r/o deficiency  Recording duration: 5 minutes        Follow-up: Return in about 1 year (around 10/31/2024) for f/u CPE.   Brittany Patrick, FNP

## 2023-11-03 ENCOUNTER — Other Ambulatory Visit: Payer: Self-pay

## 2023-11-03 ENCOUNTER — Encounter: Payer: Self-pay | Admitting: Family

## 2023-11-03 ENCOUNTER — Ambulatory Visit: Payer: Self-pay | Admitting: Family

## 2023-11-03 DIAGNOSIS — D500 Iron deficiency anemia secondary to blood loss (chronic): Secondary | ICD-10-CM

## 2023-11-03 MED ORDER — IRON (FERROUS SULFATE) 325 (65 FE) MG PO TABS
325.0000 mg | ORAL_TABLET | ORAL | 1 refills | Status: DC
  Filled 2023-11-03: qty 45, 90d supply, fill #0

## 2023-11-04 ENCOUNTER — Other Ambulatory Visit: Payer: Self-pay | Admitting: Family

## 2023-11-04 ENCOUNTER — Ambulatory Visit (INDEPENDENT_AMBULATORY_CARE_PROVIDER_SITE_OTHER)

## 2023-11-04 DIAGNOSIS — F1011 Alcohol abuse, in remission: Secondary | ICD-10-CM

## 2023-11-04 LAB — HEPATIC FUNCTION PANEL
ALT: 10 U/L (ref 0–35)
AST: 15 U/L (ref 0–37)
Albumin: 4.7 g/dL (ref 3.5–5.2)
Alkaline Phosphatase: 33 U/L — ABNORMAL LOW (ref 39–117)
Bilirubin, Direct: 0 mg/dL (ref 0.0–0.3)
Total Bilirubin: 0.2 mg/dL (ref 0.2–1.2)
Total Protein: 7.3 g/dL (ref 6.0–8.3)

## 2023-11-04 NOTE — Telephone Encounter (Signed)
 Any chance we could still add hepatic function panel?

## 2023-11-07 ENCOUNTER — Ambulatory Visit: Payer: Self-pay | Admitting: Family

## 2023-12-07 ENCOUNTER — Other Ambulatory Visit (INDEPENDENT_AMBULATORY_CARE_PROVIDER_SITE_OTHER)

## 2023-12-07 ENCOUNTER — Telehealth: Payer: Self-pay | Admitting: Family

## 2023-12-07 ENCOUNTER — Other Ambulatory Visit: Payer: Self-pay

## 2023-12-07 DIAGNOSIS — D500 Iron deficiency anemia secondary to blood loss (chronic): Secondary | ICD-10-CM

## 2023-12-07 DIAGNOSIS — F1011 Alcohol abuse, in remission: Secondary | ICD-10-CM | POA: Diagnosis not present

## 2023-12-07 DIAGNOSIS — F331 Major depressive disorder, recurrent, moderate: Secondary | ICD-10-CM | POA: Diagnosis not present

## 2023-12-07 DIAGNOSIS — F3281 Premenstrual dysphoric disorder: Secondary | ICD-10-CM | POA: Diagnosis not present

## 2023-12-07 DIAGNOSIS — F4011 Social phobia, generalized: Secondary | ICD-10-CM | POA: Diagnosis not present

## 2023-12-07 DIAGNOSIS — F329 Major depressive disorder, single episode, unspecified: Secondary | ICD-10-CM

## 2023-12-07 LAB — IBC + FERRITIN
Ferritin: 11.4 ng/mL (ref 10.0–291.0)
Iron: 96 ug/dL (ref 42–145)
Saturation Ratios: 30.2 % (ref 20.0–50.0)
TIBC: 317.8 ug/dL (ref 250.0–450.0)
Transferrin: 227 mg/dL (ref 212.0–360.0)

## 2023-12-07 LAB — CBC
HCT: 35.6 % — ABNORMAL LOW (ref 36.0–46.0)
Hemoglobin: 11.6 g/dL — ABNORMAL LOW (ref 12.0–15.0)
MCHC: 32.6 g/dL (ref 30.0–36.0)
MCV: 86.1 fl (ref 78.0–100.0)
Platelets: 249 K/uL (ref 150.0–400.0)
RBC: 4.14 Mil/uL (ref 3.87–5.11)
RDW: 20.4 % — ABNORMAL HIGH (ref 11.5–15.5)
WBC: 4.7 K/uL (ref 4.0–10.5)

## 2023-12-07 LAB — HEMOGLOBIN A1C: Hgb A1c MFr Bld: 6 % (ref 4.6–6.5)

## 2023-12-07 MED ORDER — ARIPIPRAZOLE 5 MG PO TABS
ORAL_TABLET | ORAL | 0 refills | Status: DC
  Filled 2023-12-07: qty 30, 30d supply, fill #0

## 2023-12-07 NOTE — Telephone Encounter (Signed)
 Patient has received RX for EKG.  Is asking if it can be done here or if she needs to go somewhere else please advise.

## 2023-12-07 NOTE — Addendum Note (Signed)
 Addended by: HOPE VEVA PARAS on: 12/07/2023 11:06 AM   Modules accepted: Orders

## 2023-12-07 NOTE — Telephone Encounter (Signed)
 She would have to go to the person that ordered the EKG.  Who ordered this and what for?

## 2023-12-11 ENCOUNTER — Ambulatory Visit: Payer: Self-pay | Admitting: Family

## 2023-12-13 ENCOUNTER — Telehealth: Payer: Self-pay | Admitting: Family

## 2023-12-13 NOTE — Telephone Encounter (Signed)
 Most recent A1C results are in  A1c 6.0

## 2023-12-27 ENCOUNTER — Ambulatory Visit: Admitting: Dermatology

## 2023-12-29 ENCOUNTER — Other Ambulatory Visit: Payer: Self-pay

## 2023-12-29 DIAGNOSIS — F331 Major depressive disorder, recurrent, moderate: Secondary | ICD-10-CM | POA: Diagnosis not present

## 2023-12-29 DIAGNOSIS — F4011 Social phobia, generalized: Secondary | ICD-10-CM | POA: Diagnosis not present

## 2023-12-29 DIAGNOSIS — F1011 Alcohol abuse, in remission: Secondary | ICD-10-CM | POA: Diagnosis not present

## 2023-12-29 DIAGNOSIS — F3281 Premenstrual dysphoric disorder: Secondary | ICD-10-CM | POA: Diagnosis not present

## 2023-12-29 MED ORDER — FLUOXETINE HCL 20 MG PO CAPS
20.0000 mg | ORAL_CAPSULE | Freq: Every day | ORAL | 0 refills | Status: DC
  Filled 2023-12-29: qty 30, 30d supply, fill #0

## 2024-01-04 NOTE — Telephone Encounter (Signed)
 Have her make appt so we can discuss in details the concerns she has and try to dig deepr.

## 2024-01-05 ENCOUNTER — Ambulatory Visit: Admitting: Family

## 2024-01-05 ENCOUNTER — Encounter: Payer: Self-pay | Admitting: Family

## 2024-01-05 VITALS — BP 108/62 | HR 80 | Temp 98.2°F | Ht 67.0 in | Wt 142.8 lb

## 2024-01-05 DIAGNOSIS — G4719 Other hypersomnia: Secondary | ICD-10-CM

## 2024-01-05 DIAGNOSIS — R7989 Other specified abnormal findings of blood chemistry: Secondary | ICD-10-CM | POA: Diagnosis not present

## 2024-01-05 DIAGNOSIS — D508 Other iron deficiency anemias: Secondary | ICD-10-CM | POA: Diagnosis not present

## 2024-01-05 DIAGNOSIS — R4586 Emotional lability: Secondary | ICD-10-CM | POA: Diagnosis not present

## 2024-01-05 DIAGNOSIS — F3281 Premenstrual dysphoric disorder: Secondary | ICD-10-CM

## 2024-01-05 LAB — CBC
HCT: 37.7 % (ref 36.0–46.0)
Hemoglobin: 12.4 g/dL (ref 12.0–15.0)
MCHC: 32.9 g/dL (ref 30.0–36.0)
MCV: 87.9 fl (ref 78.0–100.0)
Platelets: 261 K/uL (ref 150.0–400.0)
RBC: 4.29 Mil/uL (ref 3.87–5.11)
RDW: 19.1 % — ABNORMAL HIGH (ref 11.5–15.5)
WBC: 6.2 K/uL (ref 4.0–10.5)

## 2024-01-05 LAB — VITAMIN D 25 HYDROXY (VIT D DEFICIENCY, FRACTURES): VITD: 77.46 ng/mL (ref 30.00–100.00)

## 2024-01-05 LAB — TESTOSTERONE: Testosterone: 45.33 ng/dL — ABNORMAL HIGH (ref 15.00–40.00)

## 2024-01-05 LAB — IBC + FERRITIN
Ferritin: 9.5 ng/mL — ABNORMAL LOW (ref 10.0–291.0)
Iron: 149 ug/dL — ABNORMAL HIGH (ref 42–145)
Saturation Ratios: 48.6 % (ref 20.0–50.0)
TIBC: 306.6 ug/dL (ref 250.0–450.0)
Transferrin: 219 mg/dL (ref 212.0–360.0)

## 2024-01-05 LAB — VITAMIN B12: Vitamin B-12: 1189 pg/mL — ABNORMAL HIGH (ref 211–911)

## 2024-01-05 NOTE — Progress Notes (Signed)
 Established Patient Office Visit  Subjective:      CC:  Chief Complaint  Patient presents with   Follow-up    Review lab results    HPI: Brittany Good is a 38 y.o. female presenting on 01/05/2024 for Follow-up (Review lab results) .  Discussed the use of AI scribe software for clinical note transcription with the patient, who gave verbal consent to proceed.  History of Present Illness Brittany Good is a 38 year old female who presents with fatigue and mood swings for evaluation of anemia and elevated A1c. She is accompanied by her daughter, Soundra.  She experiences persistent fatigue, describing it as feeling 'drained all the time.' Mood swings are also present, with feelings of being down and irritable. She is currently on 20 mg of Prozac , reduced from 60 mg due to concerns of overmedication. She takes iron  and a multivitamin and remains alcohol-free. She is seeing a therapist every two weeks for mental health support.  She has a history of anemia, with a current hemoglobin level of 11.6, which was 10.5 four years ago during pregnancy. She follows a mostly vegetarian diet, consuming chicken and turkey but no red meat or pork. She does not eat many iron -rich foods and has not experienced heavy menstrual periods. No blood in stool is reported, and bowel movements are regular, though constipation has occurred since stopping alcohol.  Her A1c has increased, and she is not on any medication for it. There is a family history of diabetes on her maternal grandfather's side. She maintains a regular exercise routine, working out four to five days a week.  She experiences frequent nausea but can keep food down and does not suffer from acid reflux. Occasional inspiratory wheezing occurs, which was transiently relieved by using an inhaler she had from when she had pneumonia. She denies any history of asthma but has used an inhaler when experiencing wheezing.  She snores at night and  experiences morning headaches but has not undergone a sleep study. She denies excessive weight loss or gain and has regular menstrual cycles.         Social history:  Relevant past medical, surgical, family and social history reviewed and updated as indicated. Interim medical history since our last visit reviewed.  Allergies and medications reviewed and updated.  DATA REVIEWED: CHART IN EPIC     ROS: Negative unless specifically indicated above in HPI.    Current Outpatient Medications:    FLUoxetine  (PROZAC ) 20 MG capsule, Take 1 capsule (20 mg total) by mouth daily., Disp: 30 capsule, Rfl: 0   Iron , Ferrous Sulfate , 325 (65 Fe) MG TABS, Take 325 mg by mouth every other day., Disp: 45 tablet, Rfl: 1   Multiple Vitamins-Minerals (WOMENS MULTIVITAMIN PLUS PO), Take by mouth., Disp: , Rfl:         Objective:        BP 108/62 (BP Location: Left Arm, Patient Position: Sitting, Cuff Size: Normal)   Pulse 80   Temp 98.2 F (36.8 C) (Temporal)   Ht 5' 7 (1.702 m)   Wt 142 lb 12.8 oz (64.8 kg)   SpO2 99%   BMI 22.37 kg/m   Physical Exam   Wt Readings from Last 3 Encounters:  01/05/24 142 lb 12.8 oz (64.8 kg)  11/01/23 138 lb (62.6 kg)  03/24/23 129 lb 3.2 oz (58.6 kg)    Physical Exam Vitals reviewed.  Constitutional:      General: She is not in acute distress.  Appearance: Normal appearance. She is normal weight. She is not ill-appearing, toxic-appearing or diaphoretic.  HENT:     Head: Normocephalic.  Cardiovascular:     Rate and Rhythm: Normal rate and regular rhythm.  Pulmonary:     Effort: Pulmonary effort is normal.  Musculoskeletal:        General: Normal range of motion.  Neurological:     General: No focal deficit present.     Mental Status: She is alert and oriented to person, place, and time. Mental status is at baseline.  Psychiatric:        Mood and Affect: Mood normal.        Behavior: Behavior normal.        Thought Content: Thought  content normal.        Judgment: Judgment normal.          Results LABS Hemoglobin: 11.6  Assessment & Plan:   Assessment and Plan Assessment & Plan Fatigue and evaluation for underlying causes Persistent fatigue with mood swings, primarily feeling down and irritable. Possible hormonal changes or sleep apnea considered as underlying causes. No abnormal weight loss or gain. Thyroid  function checked in August was normal. Snoring and morning headaches suggest possible sleep apnea. - Check hormone levels including prolactin, testosterone , estradiol, and progesterone . - Repeat anemia panel. - Perform stool test for occult blood. - Consider sleep study if initial workup is inconclusive.  Major depressive disorder and mood symptoms Currently on fluoxetine  20 mg daily, reduced from 40 mg. Previous trial of Abilify  was not tolerated. Mood swings with predominant low mood and irritability. Regular therapy sessions every two weeks.  Premenstrual dysphoric disorder (PMDD) Symptoms of mood disorder shift during menstrual cycle, indicating sensitivity to hormonal changes. No excessive facial hair or acne noted. Regular menstrual cycles reported.  Anemia, unspecified Long-standing anemia with hemoglobin levels previously at 10.5 during pregnancy, now at 11.6. Vegetarian diet with limited iron -rich foods. No gastrointestinal bleeding symptoms reported. Possible absorption issues considered. High B12 intake from beverages, now discontinued. - Repeat anemia panel. - Perform stool test for occult blood.  Elevated hemoglobin A1c Elevated A1c noted, no current diabetes medication. Family history of diabetes. Vegetarian diet with awareness of protein and carbohydrate intake. No significant dietary changes reported.  Nonscarring hair loss Thinning hair noted, possibly related to anemia or hormonal changes. No excessive facial hair or acne noted.        Return in about 1 month (around  02/05/2024) for f/u anemia.     Ginger Patrick, MSN, APRN, FNP-C [facility]

## 2024-01-06 ENCOUNTER — Ambulatory Visit

## 2024-01-06 ENCOUNTER — Ambulatory Visit: Payer: Self-pay | Admitting: Family

## 2024-01-06 DIAGNOSIS — R195 Other fecal abnormalities: Secondary | ICD-10-CM

## 2024-01-06 DIAGNOSIS — R7989 Other specified abnormal findings of blood chemistry: Secondary | ICD-10-CM

## 2024-01-06 DIAGNOSIS — D500 Iron deficiency anemia secondary to blood loss (chronic): Secondary | ICD-10-CM

## 2024-01-06 DIAGNOSIS — D649 Anemia, unspecified: Secondary | ICD-10-CM

## 2024-01-06 LAB — PROGESTERONE: Progesterone: 1 ng/mL

## 2024-01-06 LAB — ESTRADIOL: Estradiol: 48 pg/mL

## 2024-01-06 LAB — PROLACTIN: Prolactin: 5.1 ng/mL

## 2024-01-09 ENCOUNTER — Other Ambulatory Visit: Payer: Self-pay

## 2024-01-09 LAB — TESTOSTERONE, FREE: TESTOSTERONE FREE: 1.3 pg/mL (ref 0.2–5.0)

## 2024-01-11 ENCOUNTER — Ambulatory Visit: Payer: Self-pay | Admitting: Family

## 2024-01-11 ENCOUNTER — Other Ambulatory Visit: Payer: Self-pay

## 2024-01-11 DIAGNOSIS — D508 Other iron deficiency anemias: Secondary | ICD-10-CM

## 2024-01-12 ENCOUNTER — Ambulatory Visit

## 2024-01-13 LAB — FECAL OCCULT BLOOD, IMMUNOCHEMICAL: Fecal Occult Bld: POSITIVE — AB

## 2024-01-16 ENCOUNTER — Other Ambulatory Visit: Payer: Self-pay

## 2024-01-16 MED ORDER — FLUOXETINE HCL 20 MG PO CAPS
20.0000 mg | ORAL_CAPSULE | Freq: Every day | ORAL | 0 refills | Status: AC
  Filled 2024-01-16: qty 30, 30d supply, fill #0

## 2024-01-18 ENCOUNTER — Inpatient Hospital Stay: Attending: Internal Medicine | Admitting: Internal Medicine

## 2024-01-18 ENCOUNTER — Inpatient Hospital Stay

## 2024-01-18 ENCOUNTER — Encounter: Payer: Self-pay | Admitting: Internal Medicine

## 2024-01-18 VITALS — BP 123/78 | HR 81 | Temp 97.2°F | Resp 18 | Ht 67.0 in | Wt 145.9 lb

## 2024-01-18 DIAGNOSIS — E611 Iron deficiency: Secondary | ICD-10-CM | POA: Diagnosis not present

## 2024-01-18 DIAGNOSIS — Z79899 Other long term (current) drug therapy: Secondary | ICD-10-CM | POA: Diagnosis not present

## 2024-01-18 DIAGNOSIS — N939 Abnormal uterine and vaginal bleeding, unspecified: Secondary | ICD-10-CM | POA: Insufficient documentation

## 2024-01-18 DIAGNOSIS — Z809 Family history of malignant neoplasm, unspecified: Secondary | ICD-10-CM | POA: Insufficient documentation

## 2024-01-18 DIAGNOSIS — D509 Iron deficiency anemia, unspecified: Secondary | ICD-10-CM | POA: Diagnosis not present

## 2024-01-18 NOTE — Assessment & Plan Note (Addendum)
#   Iron  deficiency /mild anemia-quite symptomatic with extreme fatigue.  Lack of improvement on oral iron .  Recommend continuing oral iron  for now  # Discussed regarding IV iron  infusion/Venofer. Discussed the potential acute infusion reactions with IV iron ; which are quite rare.  Patient understands the risk; will proceed with infusions.   #Etiology of iron  deficiency: Unclear-however patient had a occult stool blood that was positive recently.  Awaiting currently GI evaluation referred to PCP.  Otherwise menstrual cycles are fairly normal/not heavy.  # Elevated B12-unclear etiology question related to current MVT.  Recommend holding a multivitamin recheck labs and again in 3 months next visit.  Thank you, Ms.Dugal FNP for allowing me to participate in the care of your pleasant patient. Please do not hesitate to contact me with questions or concerns in the interim.   Ok holding off urin pregnancy test-dw pt. # DISPOSITION: # NO labs today # weekly venofer x3- start early next week.  # follow up 3 months-- MD;possible venofer; 2-3 days PRIOR- labs- cbc/bmp;iron  studies; ferritin-: b12 levels - Dr.B

## 2024-01-18 NOTE — Progress Notes (Signed)
 [facility] CONSULT NOTE  Patient Care Team: Corwin Antu, FNP as PCP - General (Family Medicine) Rennie Brittany SAUNDERS, MD as Consulting Physician (Oncology)  CHIEF COMPLAINTS/PURPOSE OF CONSULTATION: ANEMIA   HEMATOLOGY HISTORY  # ANEMIA[Hb; MCV-platelets- WBC; Iron  sat; ferritin;  GFR- CT/US - ;    Latest Reference Range & Units 01/05/24 12:06  Iron  42 - 145 ug/dL 850 (H)  TIBC 749.9 - 549.9 mcg/dL 693.3  Saturation Ratios 20.0 - 50.0 % 48.6  Ferritin 10.0 - 291.0 ng/mL 9.5 (L)  Transferrin 212.0 - 360.0 mg/dL 780.9  (H): Data is abnormally high (L): Data is abnormally low  HISTORY OF PRESENTING ILLNESS: Patient ambulating-independently.  Alone   Brittany Good 38 y.o.  female pleasant patient is  been referred to us  for further evaluation of anemia.  Patient complains of excessive fatigue.  Complains of dizziness especially on standing.  Pica:- none  Blood in stools:stool was positive for blood.  EGD/colonoscopy-NONE-  Blood in urine: none Difficulty swallowing:none Change of bowel movement/constipation: none Prior blood transfusion:none Kidney/Liver disease: none Alcohol: 2 years sober.  Bariatric surgery:none   Vaginal bleeding: normal; not heavy Prior evaluation with hematology: none Prior bone marrow biopsy: none Oral iron : started in AUG 2025- slow Fe- improved.  Prior IV iron  infusions:none    Review of Systems  Constitutional:  Positive for malaise/fatigue. Negative for chills, diaphoresis, fever and weight loss.  HENT:  Negative for nosebleeds and sore throat.   Eyes:  Negative for double vision.  Respiratory:  Negative for cough, hemoptysis, sputum production, shortness of breath and wheezing.   Cardiovascular:  Negative for chest pain, palpitations, orthopnea and leg swelling.  Gastrointestinal:  Negative for abdominal pain, blood in stool, constipation, diarrhea, heartburn, melena, nausea and vomiting.  Genitourinary:  Negative  for dysuria, frequency and urgency.  Musculoskeletal:  Negative for back pain and joint pain.  Skin: Negative.  Negative for itching and rash.  Neurological:  Negative for dizziness, tingling, focal weakness, weakness and headaches.  Endo/Heme/Allergies:  Does not bruise/bleed easily.  Psychiatric/Behavioral:  Negative for depression. The patient is not nervous/anxious and does not have insomnia.      MEDICAL HISTORY:  Past Medical History:  Diagnosis Date   Acne    Depression    Miscarriage     SURGICAL HISTORY: Past Surgical History:  Procedure Laterality Date   BREAST ENHANCEMENT SURGERY     BREAST ENHANCEMENT SURGERY     2014   COSMETIC SURGERY  2014   Breast Augmentation   DILATION AND CURETTAGE OF UTERUS     2018   DILATION AND EVACUATION N/A 12/07/2016   Procedure: DILATATION AND EVACUATION;  Surgeon: Leonce Garnette BIRCH, MD;  Location: [facility] ORS;  Service: Gynecology;  Laterality: N/A;   EYE SURGERY  2009   Lasik   LASIK     LASIK     2009   TONSILLECTOMY     1992   TONSILLECTOMY AND ADENOIDECTOMY      SOCIAL HISTORY: Social History   Socioeconomic History   Marital status: Married    Spouse name: Not on file   Number of children: 3   Years of education: Not on file   Highest education level: Bachelor's degree (e.g., BA, AB, BS)  Occupational History   Occupation: labor delivery nurse, RN  Tobacco Use   Smoking status: Never   Smokeless tobacco: Never  Vaping Use   Vaping status: Never Used  Substance and Sexual Activity   Alcohol use: Not  Currently    Alcohol/week: 6.0 standard drinks of alcohol    Types: 6 Glasses of wine per week    Comment: , occasionally   Drug use: No   Sexual activity: Yes    Birth control/protection: Other-see comments, None    Comment: Husband had vasectomy  Other Topics Concern   Not on file  Social History Narrative   Married    2 kids girls    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 is Conservation Officer, Historic Buildings to read    Wears  seat belt, no guns, safe in relationship    Moved from [facility] in 2017 to [place]   Social Drivers of Health   Financial Resource Strain: Low Risk  (10/28/2023)   Overall Financial Resource Strain (CARDIA)    Difficulty of Paying Living Expenses: Not very hard  Food Insecurity: No Food Insecurity (01/18/2024)   Hunger Vital Sign    Worried About Running Out of Food in the Last Year: Never true    Ran Out of Food in the Last Year: Never true  Transportation Needs: No Transportation Needs (01/18/2024)   PRAPARE - Administrator, Civil Service (Medical): No    Lack of Transportation (Non-Medical): No  Physical Activity: Insufficiently Active (10/28/2023)   Exercise Vital Sign    Days of Exercise per Week: 3 days    Minutes of Exercise per Session: 30 min  Stress: Stress Concern Present (10/28/2023)   [employer] of Occupational Health - Occupational Stress Questionnaire    Feeling of Stress: To some extent  Social Connections: Moderately Isolated (10/28/2023)   Social Connection and Isolation Panel    Frequency of Communication with Friends and Family: More than three times a week    Frequency of Social Gatherings with Friends and Family: Twice a week    Attends Religious Services: Never    Database Administrator or Organizations: No    Attends Engineer, Structural: Not on file    Marital Status: Married  Catering Manager Violence: Not At Risk (01/18/2024)   Humiliation, Afraid, Rape, and Kick questionnaire    Fear of Current or Ex-Partner: No    Emotionally Abused: No    Physically Abused: No    Sexually Abused: No    FAMILY HISTORY: Family History  Problem Relation Age of Onset   Alcohol abuse Brother    Cancer Maternal Grandmother        ? type   Diabetes Maternal Grandfather    Cancer Maternal Grandfather        unknown   Cancer Paternal Grandmother        lung smoker    Depression Paternal Grandfather    Breast cancer Neg Hx     ALLERGIES:  has no  known allergies.  MEDICATIONS:  Current Outpatient Medications  Medication Sig Dispense Refill   FLUoxetine  (PROZAC ) 20 MG capsule Take 1 capsule (20 mg total) by mouth daily. 30 capsule 0   FLUoxetine  (PROZAC ) 20 MG capsule Take 1 capsule (20 mg total) by mouth daily. 30 capsule 0   Iron , Ferrous Sulfate , 325 (65 Fe) MG TABS Take 325 mg by mouth every other day. 45 tablet 1   Multiple Vitamins-Minerals (WOMENS MULTIVITAMIN PLUS PO) Take by mouth.     No current facility-administered medications for this visit.     PHYSICAL EXAMINATION:   Vitals:   01/18/24 1102  BP: 123/78  Pulse: 81  Resp: 18  Temp: (!) 97.2 F (36.2  C)  SpO2: 100%   Filed Weights   01/18/24 1102  Weight: 145 lb 14.4 oz (66.2 kg)    Physical Exam Vitals and nursing note reviewed.  HENT:     Head: Normocephalic and atraumatic.     Mouth/Throat:     Pharynx: Oropharynx is clear.  Eyes:     Extraocular Movements: Extraocular movements intact.     Pupils: Pupils are equal, round, and reactive to light.  Cardiovascular:     Rate and Rhythm: Normal rate and regular rhythm.  Pulmonary:     Comments: Decreased breath sounds bilaterally.  Abdominal:     Palpations: Abdomen is soft.  Musculoskeletal:        General: Normal range of motion.     Cervical back: Normal range of motion.  Skin:    General: Skin is warm.  Neurological:     General: No focal deficit present.     Mental Status: She is alert and oriented to person, place, and time.  Psychiatric:        Behavior: Behavior normal.        Judgment: Judgment normal.      LABORATORY DATA:  I have reviewed the data as listed Lab Results  Component Value Date   WBC 6.2 01/05/2024   HGB 12.4 01/05/2024   HCT 37.7 01/05/2024   MCV 87.9 01/05/2024   PLT 261.0 01/05/2024   Recent Labs    11/01/23 1113 11/04/23 1044  NA 136  --   K 4.2  --   CL 100  --   CO2 29  --   GLUCOSE 90  --   BUN 21  --   CREATININE 0.80  --   CALCIUM 9.5   --   PROT  --  7.3  ALBUMIN  --  4.7  AST  --  15  ALT  --  10  ALKPHOS  --  33*  BILITOT  --  0.2  BILIDIR  --  0.0     No results found.  ASSESSMENT & PLAN:   Iron  deficiency # Iron  deficiency /mild anemia-quite symptomatic with extreme fatigue.  Lack of improvement on oral iron .  Recommend continuing oral iron  for now  # Discussed regarding IV iron  infusion/Venofer. Discussed the potential acute infusion reactions with IV iron ; which are quite rare.  Patient understands the risk; will proceed with infusions.   #Etiology of iron  deficiency: Unclear-however patient had a occult stool blood that was positive recently.  Awaiting currently GI evaluation referred to PCP.  Otherwise menstrual cycles are fairly normal/not heavy.  # Elevated B12-unclear etiology question related to current MVT.  Recommend holding a multivitamin recheck labs and again in 3 months next visit.  Thank you, Ms.Dugal FNP for allowing me to participate in the care of your pleasant patient. Please do not hesitate to contact me with questions or concerns in the interim.   # DISPOSITION: # NO labs today # weekly venofer x3- start early next week.  # follow up 3 months-- MD;possible venofer; 2-3 days PRIOR- labs- cbc/bmp;iron  studies; ferritin-: b12 levels - Dr.B    All questions were answered. The patient knows to call the clinic with any problems, questions or concerns.    Brittany JONELLE Joe, MD 01/18/2024 1:01 PM

## 2024-01-18 NOTE — Progress Notes (Signed)
 Patient states complaints and concerns of fatigue and new onset anemia.

## 2024-01-19 ENCOUNTER — Other Ambulatory Visit: Payer: Self-pay

## 2024-01-19 ENCOUNTER — Ambulatory Visit: Attending: Family

## 2024-01-19 DIAGNOSIS — R2689 Other abnormalities of gait and mobility: Secondary | ICD-10-CM | POA: Diagnosis not present

## 2024-01-19 DIAGNOSIS — M6281 Muscle weakness (generalized): Secondary | ICD-10-CM | POA: Diagnosis not present

## 2024-01-19 DIAGNOSIS — N3941 Urge incontinence: Secondary | ICD-10-CM | POA: Diagnosis not present

## 2024-01-19 DIAGNOSIS — R293 Abnormal posture: Secondary | ICD-10-CM | POA: Insufficient documentation

## 2024-01-19 NOTE — Patient Instructions (Signed)
 TOILET POSTURE: Urination: feet flat, lean forward with forearms on legs to fully empty bladder. Bowel movement: place feet flat on Squatty Potty or stool so knees are higher than hips, lean forward to relax pelvic floor in order to avoid strain.  SHOES: wear supportive shoes, and sandals with straps.  POSTURE: try not to cross legs at knees or ankles. Try the figure four stretch instead.  WATER: start with water first thing in the morning.   PELVIC TILTS: try to stand in neutral, not tucking your tail and not arching back, but in the middle.  Bladder diary:   ~Urinating every 2-3 hours ~Urine stream for at least 8 seconds. ~Urine color should be light, lemonade  ~If you have the urge, perform deep breathing for 5 reps and then 5-10 reps of pelvic floor contraction.  ~When do you feel the urge? ~When do you leak?

## 2024-01-19 NOTE — Therapy (Signed)
 OUTPATIENT PHYSICAL THERAPY FEMALE PELVIC EVALUATION   Patient Name: Brittany Good MRN: [id number] DOB:14-May-1985, 38 y.o., female Today's Date: 01/19/2024  END OF SESSION:  PT End of Session - 01/19/24 1321     Visit Number 1    Number of Visits 9    Date for Recertification  03/19/24    Authorization Type [employer]    Progress Note Due on Visit 10    PT Start Time 1315    PT Stop Time 1359    PT Time Calculation (min) 44 min    Activity Tolerance Patient tolerated treatment well    Behavior During Therapy WFL for tasks assessed/performed          Past Medical History:  Diagnosis Date   Acne    Depression    Miscarriage    Past Surgical History:  Procedure Laterality Date   BREAST ENHANCEMENT SURGERY     BREAST ENHANCEMENT SURGERY     2014   COSMETIC SURGERY  2014   Breast Augmentation   DILATION AND CURETTAGE OF UTERUS     2018   DILATION AND EVACUATION N/A 12/07/2016   Procedure: DILATATION AND EVACUATION;  Surgeon: Leonce Garnette BIRCH, MD;  Location: [facility] ORS;  Service: Gynecology;  Laterality: N/A;   EYE SURGERY  2009   Lasik   LASIK     LASIK     2009   TONSILLECTOMY     1992   TONSILLECTOMY AND ADENOIDECTOMY     Patient Active Problem List   Diagnosis Date Noted   Iron  deficiency 01/18/2024   Hair thinning 11/01/2023   Urge incontinence of urine 11/01/2023   MDD (major depressive disorder) 07/12/2022   PMDD (premenstrual dysphoric disorder) 07/12/2022   Social anxiety disorder 07/12/2022   Alcohol abuse, in remission 02/16/2022   Iron  deficiency anemia 05/11/2021   Chronic bilateral low back pain without sciatica 05/11/2021    PCP: Ginger Patrick FNP  REFERRING PROVIDER: Ginger Patrick FNP  REFERRING DIAG: UI  THERAPY DIAG:  Urge incontinence of urine  Muscle weakness (generalized)  Other abnormalities of gait and mobility  Abnormal posture  Rationale for Evaluation and Treatment: Rehabilitation  ONSET DATE:  11/01/23 referral date but leakage started approx. One year ago  SUBJECTIVE:                                                                                                                                                                                           SUBJECTIVE STATEMENT:Pt's four year old dtr present. URINARY FUNCTION: pt voids very often and stream is strong, voids at least once every two hours. Pt  reports SUI and urgency incontinence (more so than SUI). Pt denied pain with voiding. Pt hasn't been getting to void lately. Feels like she's fully emptying bladder.  BOWEL FUNCTION: at least once a day, mostly types 3 and 4. Pt denied pain with BMs. No fiber supplements, stool softeners, laxatives. Can't always tell if she's fully emptying the rectum.  CORE STABILITY: pt denied abdominal surgeries, MVAs, or coccyx fractures. Pt has chronic LBP and it got better with working more but hasn't been working out as much and feels it creeping up again 2/2 fatigue.  SEXUAL FUNCTION: pt denied pain with intercourse, tampon insertion or OBGYN exam. No difficulty climaxing.   Fluid intake: constantly drinking something, starts with coffee, drinks water and coffee throughout the day, seltzer water drinks, no soda or juice, occasionally has hot tea. No alcohol-recovering alcoholic.   FUNCTIONAL LIMITATIONS: limits jumping or lifting 2/2 leakage and worries about leaking at work (used to be able to hold urine)  PERTINENT HISTORY:  Medications for current condition: tried Vesicare  for four days but got PNA and was very dried out Surgeries: not for this condition Other: Iron  deficiency anemia Chronic LBP, major depressive disorder, PMDD, anxiety, UI, getting lab work done-saw hematology yesterday and will have iron  infusions, hx of breast, breast augmentation  Sexual abuse: No  PAIN:  Are you having pain? No NPRS scale: 0/10  Back pain: 0/10 at best, at worst: achy 1-2/10  PRECAUTIONS: None  RED  FLAGS: None   WEIGHT BEARING RESTRICTIONS: No  FALLS:  Has patient fallen in last 6 months? 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  ACTIVITY LEVEL : was working out by very fatigued but she still works out three times a week (tread, manufacturing systems engineer, scientist, research (physical sciences)) and she was going 4-5 days/week  PLOF: Independent  PATIENT GOALS: to stop peeing on myself-it's uncomfortable and embarrassing    BOWEL MOVEMENT: Pain with bowel movement: No Type of bowel movement:Frequency once a day Fully empty rectum: No not sure Leakage: No                                                  Caused by:  Bowel urgency: no Pads: No Fiber supplement/laxative No  URINATION: Pain with urination: No Fully empty bladder: Yes:                                           Post-void dribble: No Stream: Strong Urgency: Yes  Frequency:during the day every two hours or so                                                        Nocturia: Nonot lately   Leakage: Urge to void, Walking to the bathroom, Coughing, Sneezing, Laughing, Exercise, and Lifting Pads/briefs: Yes: three per day, pantyliners or light poise pads  INTERCOURSE:  Ability to have vaginal penetration Yes  Pain with intercourse: none Dryness: No Climax: no issues Marinoff Scale: 0/3   PREGNANCY: Number of pregnancies: 4 Vaginal deliveries 3 (18, 14, 4)  Tearing Yes: she  tore with all of them, first or second tears Episiotomy No C-section deliveries 0 Currently pregnant No  PROLAPSE: None   OBJECTIVE:  Note: Objective measures were completed at Evaluation unless otherwise noted.  COGNITION: Overall cognitive status: Within functional limits for tasks assessed     SENSATION: Light touch: Appears intact    FUNCTIONAL TESTS:   Single leg stance:  Rt: WNL  Lt: WNL Sit-up test: Squat: Bed mobility:  GAIT: Assistive device utilized: None Comments: decr. Trunk rot and fluidity  POSTURE: increased lumbar  lordosis and anterior pelvic tilt   LUMBARAROM/PROM: all WNL with a little mild discomfort of L side LBP  A/PROM A/PROM  Eval (% available)  Flexion   Extension   Right lateral flexion   Left lateral flexion   Right rotation   Left rotation    (Blank rows = not tested)  LOWER EXTREMITY ROM: all WNL except for limited B hip IR  Active ROM Right eval Left eval  Hip flexion    Hip extension    Hip abduction    Hip adduction    Hip internal rotation    Hip external rotation    Knee flexion    Knee extension    Ankle dorsiflexion    Ankle plantarflexion    Ankle inversion    Ankle eversion     (Blank rows = not tested)  LOWER EXTREMITY MMT: general B hip abd/add in seated: 4  MMT Right eval Left eval  Hip flexion 4+ 4+  Hip extension    Hip abduction    Hip adduction    Hip internal rotation 4- 4-  Hip external rotation 3+ 3+  Knee flexion 4+ 4+  Knee extension 5 5  Ankle dorsiflexion 5 5  Ankle plantarflexion    Ankle inversion    Ankle eversion     (Blank rows = not tested) PALPATION:  General: no TTP in standing during palpation of cx-lx spine.  Pelvic Alignment: APT   PELVIC MMT:   MMT eval  Vaginal   Internal Anal Sphincter   External Anal Sphincter   Puborectalis   (Blank rows = not tested)        TONE: limited by time constraints   PROLAPSE: limited by time constraints   TODAY'S TREATMENT:                                                                                                                              DATE: 01/19/24  EVAL   SELF CARE:  PATIENT EDUCATION:  Education details: PT educated pt on main functions of the pelvic floor, IAP, breath and PFM relationship. PT discussed POC, frequency and duration. PT provided the following education: TOILET POSTURE: Urination: feet flat, lean forward with forearms on legs to fully empty bladder. Bowel movement: place feet flat on Squatty Potty or stool so knees are higher than hips,  lean forward to relax pelvic floor in order to avoid strain.  SHOES: wear supportive shoes, and sandals with straps.  POSTURE: try not to cross legs at knees or ankles. Try the figure four stretch instead.  WATER: start with water first thing in the morning.   PELVIC TILTS: try to stand in neutral, not tucking your tail and not arching back, but in the middle. Bladder diary:   ~Urinating every 2-3 hours ~Urine stream for at least 8 seconds. ~Urine color should be light, lemonade  ~If you have the urge, perform deep breathing for 5 reps and then 5-10 reps of pelvic floor contraction.  ~When do you feel the urge? ~When do you leak? Person educated: Patient Education method: Explanation, Demonstration, and Handouts Education comprehension: verbalized understanding and needs further education  HOME EXERCISE PROGRAM: Not yet established  ASSESSMENT:  CLINICAL IMPRESSION: Patient is a pleasant 38 y.o. female who was seen today for physical therapy evaluation and treatment for UI.  Pt's PMH is significant for the following: Iron  deficiency anemia Chronic LBP, major depressive disorder, PMDD, anxiety, UI, getting lab work done-saw hematology yesterday and will have iron  infusions, hx of breast, breast augmentation . The following impairments were noted upon exam: limited ROM, back pain, postural dysfunction, decr. Strength likely 2/2 subjective reports and gait deviations, and urgency and stress UI. Pt would benefit from skilled PT to improve safety and decr. Pain during all ADLs.   OBJECTIVE IMPAIRMENTS: Abnormal gait, decreased coordination, decreased endurance, decreased strength, hypomobility, increased fascial restrictions, postural dysfunction, and pain.   ACTIVITY LIMITATIONS: carrying, lifting, bending, sitting, standing, squatting, transfers, continence, locomotion level, and caring for others  PARTICIPATION LIMITATIONS: meal prep, cleaning, laundry, interpersonal relationship,  community activity, and occupation  PERSONAL FACTORS: 3+ comorbidities: see above and pt currently undergoing GI testing are also affecting patient's functional outcome.   REHAB POTENTIAL: Good  CLINICAL DECISION MAKING: Evolving/moderate complexity  EVALUATION COMPLEXITY: Moderate currently undergoing GI testing   GOALS: Goals reviewed with patient? Yes  SHORT TERM GOALS: Target date: for all STGs: 02/16/24  Pt will be IND in HEP to improve pain, strength, coordination. Baseline: no HEP but goes to gym 3x/week but used to go 4-5x/week Goal status: INITIAL  2.  Finish exam and write goals as indicated. Baseline: limited by time constraints Goal status: INITIAL  3.  Pt will demo proper toileting posture to fully empty bladder and reduce straining during bowel movement. Baseline: unable to demo Goal status: INITIAL  4.  Pt will demonstrated improved relaxation and contraction of PFM with coordination of breath to reduce urinary leakage to </=three/week. Baseline: wears three pads a day 2/2 UI Goal status: INITIAL   LONG TERM GOALS: Target date: for all LTGs: 03/15/24  Pt will report going back to the gym 4-5x/week to maximize gains made in PT. Baseline: three times a week-decr. 2/2 fatigue and UI limits what she does Goal status: INITIAL  2.  Pt will demonstrated improved relaxation and contraction of PFM with coordination of breath to reduce urinary leakage to </=once/week. Baseline:  three pads a day Goal status: INITIAL  PLAN:finish exam (palpation, ROM, MMT, DR) Establish HEP.    PT FREQUENCY: 1x/week  PT DURATION: 8 weeks  PLANNED INTERVENTIONS: 97110-Therapeutic exercises, 97530- Therapeutic activity, [id number]- Neuromuscular re-education, 97535- Self Care, [id number]- Manual therapy, and Patient/Family education   Aparna Vanderweele L, PT 01/19/2024, 1:22 PM  Delon Pinal, PT,DPT 01/19/24 1:22 PM Phone: [phone] Fax: [phone]

## 2024-01-24 ENCOUNTER — Inpatient Hospital Stay

## 2024-01-24 VITALS — BP 111/65 | HR 1 | Temp 97.3°F | Resp 18

## 2024-01-24 DIAGNOSIS — N939 Abnormal uterine and vaginal bleeding, unspecified: Secondary | ICD-10-CM | POA: Diagnosis not present

## 2024-01-24 DIAGNOSIS — E611 Iron deficiency: Secondary | ICD-10-CM

## 2024-01-24 DIAGNOSIS — D509 Iron deficiency anemia, unspecified: Secondary | ICD-10-CM | POA: Diagnosis not present

## 2024-01-24 DIAGNOSIS — Z79899 Other long term (current) drug therapy: Secondary | ICD-10-CM | POA: Diagnosis not present

## 2024-01-24 DIAGNOSIS — Z809 Family history of malignant neoplasm, unspecified: Secondary | ICD-10-CM | POA: Diagnosis not present

## 2024-01-24 MED ORDER — IRON SUCROSE 20 MG/ML IV SOLN
200.0000 mg | Freq: Once | INTRAVENOUS | Status: AC
  Administered 2024-01-24: 200 mg via INTRAVENOUS
  Filled 2024-01-24: qty 10

## 2024-01-26 ENCOUNTER — Ambulatory Visit

## 2024-01-26 ENCOUNTER — Other Ambulatory Visit: Payer: Self-pay

## 2024-01-26 DIAGNOSIS — R5383 Other fatigue: Secondary | ICD-10-CM | POA: Diagnosis not present

## 2024-01-26 DIAGNOSIS — L680 Hirsutism: Secondary | ICD-10-CM | POA: Diagnosis not present

## 2024-01-26 DIAGNOSIS — N946 Dysmenorrhea, unspecified: Secondary | ICD-10-CM | POA: Diagnosis not present

## 2024-01-26 DIAGNOSIS — L659 Nonscarring hair loss, unspecified: Secondary | ICD-10-CM | POA: Diagnosis not present

## 2024-01-30 ENCOUNTER — Inpatient Hospital Stay

## 2024-01-30 ENCOUNTER — Other Ambulatory Visit: Payer: Self-pay

## 2024-01-30 ENCOUNTER — Encounter: Payer: Self-pay | Admitting: Family

## 2024-01-30 ENCOUNTER — Ambulatory Visit

## 2024-01-30 VITALS — BP 108/78 | HR 79 | Temp 97.3°F | Resp 16

## 2024-01-30 DIAGNOSIS — R7303 Prediabetes: Secondary | ICD-10-CM

## 2024-01-30 DIAGNOSIS — M6281 Muscle weakness (generalized): Secondary | ICD-10-CM

## 2024-01-30 DIAGNOSIS — R293 Abnormal posture: Secondary | ICD-10-CM

## 2024-01-30 DIAGNOSIS — E611 Iron deficiency: Secondary | ICD-10-CM

## 2024-01-30 DIAGNOSIS — Z809 Family history of malignant neoplasm, unspecified: Secondary | ICD-10-CM | POA: Diagnosis not present

## 2024-01-30 DIAGNOSIS — R2689 Other abnormalities of gait and mobility: Secondary | ICD-10-CM | POA: Diagnosis not present

## 2024-01-30 DIAGNOSIS — N3941 Urge incontinence: Secondary | ICD-10-CM | POA: Diagnosis not present

## 2024-01-30 DIAGNOSIS — Z79899 Other long term (current) drug therapy: Secondary | ICD-10-CM | POA: Diagnosis not present

## 2024-01-30 DIAGNOSIS — N939 Abnormal uterine and vaginal bleeding, unspecified: Secondary | ICD-10-CM | POA: Diagnosis not present

## 2024-01-30 DIAGNOSIS — D509 Iron deficiency anemia, unspecified: Secondary | ICD-10-CM | POA: Diagnosis not present

## 2024-01-30 MED ORDER — IRON SUCROSE 20 MG/ML IV SOLN
200.0000 mg | Freq: Once | INTRAVENOUS | Status: AC
  Administered 2024-01-30: 200 mg via INTRAVENOUS
  Filled 2024-01-30: qty 10

## 2024-01-30 NOTE — Progress Notes (Unsigned)
 01/31/2024 Brittany Good [id number] 1985/03/28  Gastroenterology Office Note    Referring Provider: Corwin Antu, FNP Primary Care Physician:  Corwin Antu, FNP  Primary GI Provider: Jinny Carmine, MD    Chief Complaint   Chief Complaint  Patient presents with   New Patient (Initial Visit)    Anemia-currently having iron  infusions weekly x 2 weeks -feeling fatigue  and having nausea     History of Present Illness   Brittany Good is a 38 y.o. female presenting today at the request of Dugal, Tabitha, FNP due to iron  deficiency anemia.   Patient seen by oncology/hematology on 01/18/2024 for anemia. Complaints of excessive fatigue.   Patient reports that her for the past couple of years she has felt fatigued, her hair was falling out and she was really tired.  She worked night shifts for a long time and has been on days for 2 years, she hoped that would help. She is currently receiving iron  infusions. She reports that she does have some mild nausea in the morning after she eats, denies vomiting.   Patient reports that she used to struggle with constipation when she used to drink alcohol.  She stopped drinking alcohol 2 years ago.  When she would drink wine it helped her bowels move.  Now she she has bowel movement and it may be a small amount a couple times a day or a larger bowel movement that is more mushy. She does not have to strain. Denies abdominal pain, melena, or hematochezia. She drinks about 40 ounces of water daily.   Positive fecal occult on 01/11/2024.  Denies family history of colon cancer.     Past Medical History:  Diagnosis Date   Acne    Acute upper respiratory infection, unspecified 01/30/2023   Anemia    Depression    Miscarriage     Past Surgical History:  Procedure Laterality Date   BREAST ENHANCEMENT SURGERY     BREAST ENHANCEMENT SURGERY     2014   COSMETIC SURGERY  2014   Breast Augmentation   DILATION AND CURETTAGE OF UTERUS      2018   DILATION AND EVACUATION N/A 12/07/2016   Procedure: DILATATION AND EVACUATION;  Surgeon: Leonce Garnette BIRCH, MD;  Location: [facility] ORS;  Service: Gynecology;  Laterality: N/A;   EYE SURGERY  2009   Lasik   LASIK     LASIK     2009   TONSILLECTOMY     1992   TONSILLECTOMY AND ADENOIDECTOMY      Current Outpatient Medications  Medication Sig Dispense Refill   FLUoxetine  (PROZAC ) 20 MG capsule Take 1 capsule (20 mg total) by mouth daily. 30 capsule 0   Multiple Vitamins-Minerals (WOMENS MULTIVITAMIN PLUS PO) Take by mouth.     No current facility-administered medications for this visit.    Allergies as of 01/31/2024   (No Known Allergies)    Family History  Problem Relation Age of Onset   Healthy Mother    Healthy Father    Alcohol abuse Brother    Cancer Maternal Grandmother        ? type   Diabetes Maternal Grandfather    Cancer Maternal Grandfather        unknown   Cancer Paternal Grandmother        lung smoker    Depression Paternal Grandfather    Breast cancer Neg Hx     Social History   Socioeconomic History   Marital status:  Married    Spouse name: Not on file   Number of children: 3   Years of education: Not on file   Highest education level: Bachelor's degree (e.g., BA, AB, BS)  Occupational History   Occupation: labor delivery nurse, RN  Tobacco Use   Smoking status: Never   Smokeless tobacco: Never  Vaping Use   Vaping status: Never Used  Substance and Sexual Activity   Alcohol use: Not Currently    Alcohol/week: 6.0 standard drinks of alcohol    Types: 6 Glasses of wine per week    Comment: , occasionally   Drug use: No   Sexual activity: Yes    Birth control/protection: Other-see comments, None    Comment: Husband had vasectomy  Other Topics Concern   Not on file  Social History Narrative   Married    2 kids girls    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 is Conservation Officer, Historic Buildings to read    Wears seat belt, no guns, safe in relationship     Moved from [facility] in 2017 to [place]   Social Drivers of Health   Financial Resource Strain: Low Risk  (10/28/2023)   Overall Financial Resource Strain (CARDIA)    Difficulty of Paying Living Expenses: Not very hard  Food Insecurity: No Food Insecurity (01/18/2024)   Hunger Vital Sign    Worried About Running Out of Food in the Last Year: Never true    Ran Out of Food in the Last Year: Never true  Transportation Needs: No Transportation Needs (01/18/2024)   PRAPARE - Administrator, Civil Service (Medical): No    Lack of Transportation (Non-Medical): No  Physical Activity: Insufficiently Active (10/28/2023)   Exercise Vital Sign    Days of Exercise per Week: 3 days    Minutes of Exercise per Session: 30 min  Stress: Stress Concern Present (10/28/2023)   [employer] of Occupational Health - Occupational Stress Questionnaire    Feeling of Stress: To some extent  Social Connections: Moderately Isolated (10/28/2023)   Social Connection and Isolation Panel    Frequency of Communication with Friends and Family: More than three times a week    Frequency of Social Gatherings with Friends and Family: Twice a week    Attends Religious Services: Never    Database Administrator or Organizations: No    Attends Engineer, Structural: Not on file    Marital Status: Married  Catering Manager Violence: Not At Risk (01/18/2024)   Humiliation, Afraid, Rape, and Kick questionnaire    Fear of Current or Ex-Partner: No    Emotionally Abused: No    Physically Abused: No    Sexually Abused: No     RELEVANT GI HISTORY, IMAGING AND LABS: CBC    Component Value Date/Time   WBC 6.2 01/05/2024 1206   RBC 4.29 01/05/2024 1206   HGB 12.4 01/05/2024 1206   HGB 10.5 (L) 04/06/2019 1336   HCT 37.7 01/05/2024 1206   HCT 31.8 (L) 04/06/2019 1336   PLT 261.0 01/05/2024 1206   PLT 258 04/06/2019 1336   MCV 87.9 01/05/2024 1206   MCV 93 04/06/2019 1336   MCH 26.3 06/24/2019 0659   MCHC 32.9  01/05/2024 1206   RDW 19.1 (H) 01/05/2024 1206   RDW 11.8 04/06/2019 1336   LYMPHSABS 2.0 05/11/2021 1629   LYMPHSABS 2.5 04/06/2019 1336   MONOABS 0.4 05/11/2021 1629   EOSABS 0.1 05/11/2021 1629   EOSABS 0.2 04/06/2019  1336   BASOSABS 0.1 05/11/2021 1629   BASOSABS 0.1 04/06/2019 1336   Recent Labs    11/01/23 1113 12/07/23 1038 01/05/24 1206  HGB 10.9* 11.6* 12.4    CMP     Component Value Date/Time   NA 136 11/01/2023 1113   K 4.2 11/01/2023 1113   CL 100 11/01/2023 1113   CO2 29 11/01/2023 1113   GLUCOSE 90 11/01/2023 1113   BUN 21 11/01/2023 1113   CREATININE 0.80 11/01/2023 1113   CREATININE 0.87 07/19/2017 0759   CALCIUM 9.5 11/01/2023 1113   PROT 7.3 11/04/2023 1044   ALBUMIN 4.7 11/04/2023 1044   AST 15 11/04/2023 1044   ALT 10 11/04/2023 1044   ALKPHOS 33 (L) 11/04/2023 1044   BILITOT 0.2 11/04/2023 1044      Latest Ref Rng & Units 11/04/2023   10:44 AM 05/11/2021    4:29 PM 07/19/2017    7:59 AM  Hepatic Function  Total Protein 6.0 - 8.3 g/dL 7.3  7.3  7.1   Albumin 3.5 - 5.2 g/dL 4.7  4.6    AST 0 - 37 U/L 15  15  16    ALT 0 - 35 U/L 10  10  12    Alk Phosphatase 39 - 117 U/L 33  28    Total Bilirubin 0.2 - 1.2 mg/dL 0.2  0.5  0.6   Bilirubin, Direct 0.0 - 0.3 mg/dL 0.0         Review of Systems   All systems reviewed and negative except where noted in HPI.    Physical Exam  BP 105/70   Pulse 77   Temp 97.9 F (36.6 C)   Ht 5' 7 (1.702 m)   Wt 146 lb 6.4 oz (66.4 kg)   LMP 01/30/2024 (Exact Date)   SpO2 99%   BMI 22.93 kg/m  Patient's last menstrual period was 01/30/2024 (exact date). General:   Alert and oriented. Pleasant and cooperative. Well-nourished and well-developed.  Head:  Normocephalic and atraumatic. Eyes:  Without icterus Ears:  Normal auditory acuity. Lungs:  Respirations even and unlabored.  Clear throughout to auscultation.   No wheezes, crackles, or rhonchi. No acute distress. Heart:  Regular rate and rhythm; no  murmurs, clicks, rubs, or gallops. Abdomen:  Normal bowel sounds.  No bruits.  Soft, non-tender and non-distended without masses, hepatosplenomegaly or hernias noted.  No guarding or rebound tenderness. .   Rectal:  Deferred. Msk:  Symmetrical without gross deformities. Normal posture. Extremities:  Without edema. Neurologic:  Alert and  oriented x4;  grossly normal neurologically. Skin:  Intact without significant lesions or rashes. Psych:  Alert and cooperative. Normal mood and affect.   Assessment & Plan   Brittany Good is a 38 y.o. female presenting today with iron  deficiency anemia, some constipation, and mild nausea.  Iron  deficiency anemia. HGB improving now  at 13.1 with iron  infusions. Ferritin 9.5 01/05/2024. Continues to have fatigue. - continue iron  infusions with hematology - avoid NSAIDS - plan for EGD/colonoscopy in the near future. I have discussed risks of EGD/colonoscopy with patient today, including risk of sedation, bleeding or perforation. Patient provides understanding and gave verbal consent to proceed.   Constipation. Sometimes only a small amount when trying to have a BM. Denies having to strain. - Start Miralax Mix 1/2 - 1 capful in a drink daily. - Start Benefiber Mix 1 TBSP in a drink daily.  Mild nausea - discussed dietary/lifestyle modifications.  - offered to prescribe PPI to  help with nausea. Patient reports she has OTC medication that she will try first.  Follow up in 3 months  Grayce Bohr, DNP, AGNP-C [facility]

## 2024-01-30 NOTE — Therapy (Signed)
 OUTPATIENT PHYSICAL THERAPY FEMALE PELVIC TREATMENT   Patient Name: Brittany Good MRN: [id number] DOB:[birth year], 38 y.o., female Today's Date: 01/30/2024  END OF SESSION:  PT End of Session - 01/30/24 1447     Visit Number 2    Number of Visits 9    Date for Recertification  03/19/24    Authorization Type [employer]    Progress Note Due on Visit 10    PT Start Time 1445    PT Stop Time 1525    PT Time Calculation (min) 40 min    Activity Tolerance Patient tolerated treatment well    Behavior During Therapy [facility] for tasks assessed/performed          Past Medical History:  Diagnosis Date   Acne    Acute upper respiratory infection, unspecified 01/30/2023   Depression    Miscarriage    Past Surgical History:  Procedure Laterality Date   BREAST ENHANCEMENT SURGERY     BREAST ENHANCEMENT SURGERY     2014   COSMETIC SURGERY  2014   Breast Augmentation   DILATION AND CURETTAGE OF UTERUS     2018   DILATION AND EVACUATION N/A 12/07/2016   Procedure: DILATATION AND EVACUATION;  Surgeon: Leonce Garnette BIRCH, MD;  Location: [facility] ORS;  Service: Gynecology;  Laterality: N/A;   EYE SURGERY  2009   Lasik   LASIK     LASIK     2009   TONSILLECTOMY     1992   TONSILLECTOMY AND ADENOIDECTOMY     Patient Active Problem List   Diagnosis Date Noted   Iron  deficiency 01/18/2024   Hair thinning 11/01/2023   Urge incontinence of urine 11/01/2023   MDD (major depressive disorder) 07/12/2022   PMDD (premenstrual dysphoric disorder) 07/12/2022   Social anxiety disorder 07/12/2022   Alcohol abuse, in remission 02/16/2022   Iron  deficiency anemia 05/11/2021   Chronic bilateral low back pain without sciatica 05/11/2021    PCP: Ginger Patrick FNP  REFERRING PROVIDER: Ginger Patrick FNP  REFERRING DIAG: UI  THERAPY DIAG:  Urge incontinence of urine  Muscle weakness (generalized)  Other abnormalities of gait and mobility  Abnormal posture  Rationale  for Evaluation and Treatment: Rehabilitation  ONSET DATE: 11/01/23 referral date but leakage started approx. One year ago  SUBJECTIVE:                                                                                                                                                                                           SUBJECTIVE STATEMENT: Pt has started iron  infusions but hasn't felt any difference, period started today.  Pt  tracked frequency of urine: she uses bathroom every 1.5-2 hours. She had at least two episodes of urgency leakage during that time. She was able to stop flow while urinating.   EVAL: URINARY FUNCTION: pt voids very often and stream is strong, voids at least once every two hours. Pt reports SUI and urgency incontinence (more so than SUI). Pt denied pain with voiding. Pt hasn't been getting to void lately. Feels like she's fully emptying bladder.  BOWEL FUNCTION: at least once a day, mostly types 3 and 4. Pt denied pain with BMs. No fiber supplements, stool softeners, laxatives. Can't always tell if she's fully emptying the rectum.  CORE STABILITY: pt denied abdominal surgeries, MVAs, or coccyx fractures. Pt has chronic LBP and it got better with working more but hasn't been working out as much and feels it creeping up again 2/2 fatigue.  SEXUAL FUNCTION: pt denied pain with intercourse, tampon insertion or OBGYN exam. No difficulty climaxing.   Fluid intake: constantly drinking something, starts with coffee, drinks water and coffee throughout the day, seltzer water drinks, no soda or juice, occasionally has hot tea. No alcohol-recovering alcoholic.   FUNCTIONAL LIMITATIONS: limits jumping or lifting 2/2 leakage and worries about leaking at work (used to be able to hold urine)  PERTINENT HISTORY:  Medications for current condition: tried Vesicare  for four days but got PNA and was very dried out Surgeries: not for this condition Other: Iron  deficiency anemia Chronic LBP, major  depressive disorder, PMDD, anxiety, UI, getting lab work done-saw hematology yesterday and will have iron  infusions, hx of breast, breast augmentation  Sexual abuse: No  PAIN:  Are you having pain? Yes 01/30/24 NPRS scale: 2/10-period HA  Back pain: 0/10 at best, at worst: achy 1-2/10  PRECAUTIONS: None  RED FLAGS: None   WEIGHT BEARING RESTRICTIONS: No  FALLS:  Has patient fallen in last 6 months? 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  ACTIVITY LEVEL : was working out by very fatigued but she still works out three times a week (tread, manufacturing systems engineer, scientist, research (physical sciences)) and she was going 4-5 days/week  PLOF: Independent  PATIENT GOALS: to stop peeing on myself-it's uncomfortable and embarrassing    BOWEL MOVEMENT: Pain with bowel movement: No Type of bowel movement:Frequency once a day Fully empty rectum: No not sure Leakage: No                                                  Caused by:  Bowel urgency: no Pads: No Fiber supplement/laxative No  URINATION: Pain with urination: No Fully empty bladder: Yes:                                           Post-void dribble: No Stream: Strong Urgency: Yes  Frequency:during the day every two hours or so                                                        Nocturia: Nonot lately   Leakage: Urge to void, Walking to the bathroom, Coughing, Sneezing,  Laughing, Exercise, and Lifting Pads/briefs: Yes: three per day, pantyliners or light poise pads  INTERCOURSE:  Ability to have vaginal penetration Yes  Pain with intercourse: none Dryness: No Climax: no issues Marinoff Scale: 0/3   PREGNANCY: Number of pregnancies: 4 Vaginal deliveries 3 (18, 14, 4)  Tearing Yes: she tore with all of them, first or second tears Episiotomy No C-section deliveries 0 Currently pregnant No  PROLAPSE: None   OBJECTIVE:  Note: Objective measures were completed at Evaluation unless otherwise noted.  COGNITION: Overall  cognitive status: Within functional limits for tasks assessed     SENSATION: Light touch: Appears intact    FUNCTIONAL TESTS:   Single leg stance:  Rt: WNL  Lt: WNL Sit-up test: Squat: Bed mobility:  GAIT: Assistive device utilized: None Comments: decr. Trunk rot and fluidity  POSTURE: increased lumbar lordosis and anterior pelvic tilt   LUMBARAROM/PROM: all WNL with a little mild discomfort of L side LBP  A/PROM A/PROM  Eval (% available)  Flexion   Extension   Right lateral flexion   Left lateral flexion   Right rotation   Left rotation    (Blank rows = not tested)  LOWER EXTREMITY ROM: all WNL except for limited B hip IR  Active ROM Right eval Left eval  Hip flexion    Hip extension    Hip abduction    Hip adduction    Hip internal rotation    Hip external rotation    Knee flexion    Knee extension    Ankle dorsiflexion    Ankle plantarflexion    Ankle inversion    Ankle eversion     (Blank rows = not tested)  LOWER EXTREMITY MMT: general B hip abd/add in seated: 4  MMT Right eval Left eval  Hip flexion 4+ 4+  Hip extension    Hip abduction 3+ 3+  Hip adduction 3+ 3+  Hip internal rotation 4- 4-  Hip external rotation 3+ 3+  Knee flexion 4+ 4+  Knee extension 5 5  Ankle dorsiflexion 5 5  Ankle plantarflexion    Ankle inversion    Ankle eversion     (Blank rows = not tested) PALPATION:  General: no TTP during palpation of cx-lx spine, hypomobility of tx spine noted, DR: two finger's width at umbilicus and inf. To umbilicus, about 1-1.5 finger's width sup. To umbilicus. Decr. Post. Rib expansion during inhalation. Glutes, hip add., and rectus abdominus activation noted during external, gloved palpation of PFM contraction/relaxation.  Pelvic Alignment: APT   PELVIC MMT:   MMT eval  Vaginal   Internal Anal Sphincter   External Anal Sphincter   Puborectalis   (Blank rows = not tested)        TONE: Incr. Tension in  paraspinals  PROLAPSE: none   TODAY'S TREATMENT:  DATE: 01/30/24   Physical function test: PT completed exam (palpation, MMT, DR, ROM). See above for details.  NMR: Access Code: [email or web address] URL: [email or web address] Date: 01/30/2024 Prepared by: Delon Pinal  Exercises - Supine Angels  - 1 x daily - 7 x weekly - 1 sets - 10 reps - Sidelying Open Book  - 1 x daily - 7 x weekly - 1 sets - 10 reps - Cat Cow  - 1 x daily - 7 x weekly - 1 sets - 5 reps - Diaphragmatic Breathing in Child's Pose with Pelvic Floor Relaxation  - 1 x daily - 7 x weekly - 1 sets - 5 reps - 30 hold Cues and demo for proper technique. S for safety. No pain.   SELF CARE:  PATIENT EDUCATION:  Education details: PT educated pt on exam findings, new HEP and mindfulness techniques to down-regulate CNS to decr. PFM tension, which can incr. Leakage. 5 minute body scan guided meditation performed end of session to decr. PFM tension. Person educated: Patient Education method: Explanation, Demonstration, and Handouts Education comprehension: verbalized understanding and needs further education  HOME EXERCISE PROGRAM: [email or web address]  ASSESSMENT:  CLINICAL IMPRESSION: Skilled session focused on completing exam (difficulty coordinating breath with PFM contraction and glutes, rectus ab., and add. activation, decr. Post. Rib translation with inhale, TTP -none and hypomobility tx spine, hip weakness noted). The following impairments were noted upon exam: limited ROM, back pain, postural dysfunction, decr. Strength likely, and urgency and stress UI. Pt would continue to benefit from skilled PT to improve safety and decr. Pain during all ADLs.   OBJECTIVE IMPAIRMENTS: Abnormal gait, decreased coordination, decreased endurance, decreased strength, hypomobility, increased fascial restrictions,  postural dysfunction, and pain.   ACTIVITY LIMITATIONS: carrying, lifting, bending, sitting, standing, squatting, transfers, continence, locomotion level, and caring for others  PARTICIPATION LIMITATIONS: meal prep, cleaning, laundry, interpersonal relationship, community activity, and occupation  PERSONAL FACTORS: 3+ comorbidities: see above and pt currently undergoing GI testing are also affecting patient's functional outcome.   REHAB POTENTIAL: Good  CLINICAL DECISION MAKING: Evolving/moderate complexity  EVALUATION COMPLEXITY: Moderate currently undergoing GI testing   GOALS: Goals reviewed with patient? Yes  SHORT TERM GOALS: Target date: for all STGs: 02/16/24  Pt will be IND in HEP to improve pain, strength, coordination. Baseline: no HEP but goes to gym 3x/week but used to go 4-5x/week Goal status: INITIAL  2.  Finish exam and write goals as indicated. Baseline: limited by time constraints Goal status: MET  3.  Pt will demo proper toileting posture to fully empty bladder and reduce straining during bowel movement. Baseline: unable to demo Goal status: INITIAL  4.  Pt will demonstrated improved relaxation and contraction of PFM with coordination of breath to reduce urinary leakage to </=three/week. Baseline: wears three pads a day 2/2 UI Goal status: INITIAL   LONG TERM GOALS: Target date: for all LTGs: 03/15/24  Pt will report going back to the gym 4-5x/week to maximize gains made in PT. Baseline: three times a week-decr. 2/2 fatigue and UI limits what she does Goal status: INITIAL  2.  Pt will demonstrated improved relaxation and contraction of PFM with coordination of breath to reduce urinary leakage to </=once/week. Baseline:  three pads a day Goal status: INITIAL  PLAN:review and progress HEP.    PT FREQUENCY: 1x/week  PT DURATION: 8 weeks  PLANNED INTERVENTIONS: 97110-Therapeutic exercises, 97530- Therapeutic activity, [id number]- Neuromuscular  re-education, 97535- Self Care, [id number]- Manual therapy, and Patient/Family education   Ruqaya Strauss L,  PT 01/30/2024, 2:48 PM  Delon Pinal, PT,DPT 01/30/24 2:48 PM Phone: [phone] Fax: [phone]

## 2024-01-30 NOTE — Patient Instructions (Signed)

## 2024-01-31 ENCOUNTER — Other Ambulatory Visit: Payer: Self-pay

## 2024-01-31 ENCOUNTER — Ambulatory Visit: Admitting: Family Medicine

## 2024-01-31 ENCOUNTER — Encounter: Payer: Self-pay | Admitting: Family Medicine

## 2024-01-31 VITALS — BP 105/70 | HR 77 | Temp 97.9°F | Ht 67.0 in | Wt 146.4 lb

## 2024-01-31 DIAGNOSIS — D508 Other iron deficiency anemias: Secondary | ICD-10-CM | POA: Diagnosis not present

## 2024-01-31 DIAGNOSIS — K5909 Other constipation: Secondary | ICD-10-CM | POA: Diagnosis not present

## 2024-01-31 DIAGNOSIS — R110 Nausea: Secondary | ICD-10-CM | POA: Diagnosis not present

## 2024-01-31 MED ORDER — NA SULFATE-K SULFATE-MG SULF 17.5-3.13-1.6 GM/177ML PO SOLN
1.0000 | Freq: Once | ORAL | 0 refills | Status: AC
  Filled 2024-01-31: qty 354, 1d supply, fill #0

## 2024-02-02 ENCOUNTER — Ambulatory Visit

## 2024-02-06 ENCOUNTER — Inpatient Hospital Stay

## 2024-02-06 ENCOUNTER — Other Ambulatory Visit: Payer: Self-pay

## 2024-02-06 VITALS — BP 112/85 | HR 79 | Temp 98.5°F | Resp 16

## 2024-02-06 DIAGNOSIS — N939 Abnormal uterine and vaginal bleeding, unspecified: Secondary | ICD-10-CM | POA: Diagnosis not present

## 2024-02-06 DIAGNOSIS — E611 Iron deficiency: Secondary | ICD-10-CM

## 2024-02-06 DIAGNOSIS — Z809 Family history of malignant neoplasm, unspecified: Secondary | ICD-10-CM | POA: Diagnosis not present

## 2024-02-06 DIAGNOSIS — D509 Iron deficiency anemia, unspecified: Secondary | ICD-10-CM | POA: Diagnosis not present

## 2024-02-06 DIAGNOSIS — Z79899 Other long term (current) drug therapy: Secondary | ICD-10-CM | POA: Diagnosis not present

## 2024-02-06 MED ORDER — IRON SUCROSE 20 MG/ML IV SOLN
200.0000 mg | Freq: Once | INTRAVENOUS | Status: AC
  Administered 2024-02-06: 200 mg via INTRAVENOUS
  Filled 2024-02-06: qty 10

## 2024-02-06 NOTE — Patient Instructions (Signed)

## 2024-02-07 ENCOUNTER — Ambulatory Visit

## 2024-02-07 ENCOUNTER — Other Ambulatory Visit: Payer: Self-pay

## 2024-02-07 DIAGNOSIS — M6281 Muscle weakness (generalized): Secondary | ICD-10-CM | POA: Diagnosis not present

## 2024-02-07 DIAGNOSIS — R293 Abnormal posture: Secondary | ICD-10-CM | POA: Diagnosis not present

## 2024-02-07 DIAGNOSIS — R2689 Other abnormalities of gait and mobility: Secondary | ICD-10-CM

## 2024-02-07 DIAGNOSIS — N3941 Urge incontinence: Secondary | ICD-10-CM | POA: Diagnosis not present

## 2024-02-07 NOTE — Therapy (Signed)
 OUTPATIENT PHYSICAL THERAPY FEMALE PELVIC TREATMENT   Patient Name: Brittany Good MRN: [id number] DOB:03-Oct-1985, 38 y.o., female Today's Date: 02/07/2024  END OF SESSION:  PT End of Session - 02/07/24 1103     Visit Number 3    Number of Visits 9    Date for Recertification  03/19/24    Authorization Type [employer]    Progress Note Due on Visit 10    PT Start Time 1101    PT Stop Time 1140    PT Time Calculation (min) 39 min    Activity Tolerance Patient tolerated treatment well    Behavior During Therapy WFL for tasks assessed/performed          Past Medical History:  Diagnosis Date   Acne    Acute upper respiratory infection, unspecified 01/30/2023   Anemia    Depression    Miscarriage    Past Surgical History:  Procedure Laterality Date   BREAST ENHANCEMENT SURGERY     BREAST ENHANCEMENT SURGERY     2014   COSMETIC SURGERY  2014   Breast Augmentation   DILATION AND CURETTAGE OF UTERUS     2018   DILATION AND EVACUATION N/A 12/07/2016   Procedure: DILATATION AND EVACUATION;  Surgeon: Leonce Garnette BIRCH, MD;  Location: [facility];  Service: Gynecology;  Laterality: N/A;   EYE SURGERY  2009   Lasik   LASIK     LASIK     2009   TONSILLECTOMY     1992   TONSILLECTOMY AND ADENOIDECTOMY     Patient Active Problem List   Diagnosis Date Noted   Iron  deficiency 01/18/2024   Hair thinning 11/01/2023   Urge incontinence of urine 11/01/2023   MDD (major depressive disorder) 07/12/2022   PMDD (premenstrual dysphoric disorder) 07/12/2022   Social anxiety disorder 07/12/2022   Alcohol abuse, in remission 02/16/2022   Iron  deficiency anemia 05/11/2021   Chronic bilateral low back pain without sciatica 05/11/2021    PCP: Ginger Patrick FNP  REFERRING PROVIDER: Ginger Patrick FNP  REFERRING DIAG: UI  THERAPY DIAG:  Urge incontinence of urine  Muscle weakness (generalized)  Other abnormalities of gait and mobility  Abnormal  posture  Rationale for Evaluation and Treatment: Rehabilitation  ONSET DATE: 11/01/23 referral date but leakage started approx. One year ago  SUBJECTIVE:                                                                                                                                                                                           SUBJECTIVE STATEMENT: Pt reported she's having a good week. Pt reported HEP is  going well.   EVAL: URINARY FUNCTION: pt voids very often and stream is strong, voids at least once every two hours. Pt reports SUI and urgency incontinence (more so than SUI). Pt denied pain with voiding. Pt hasn't been getting to void lately. Feels like she's fully emptying bladder.  BOWEL FUNCTION: at least once a day, mostly types 3 and 4. Pt denied pain with BMs. No fiber supplements, stool softeners, laxatives. Can't always tell if she's fully emptying the rectum.  CORE STABILITY: pt denied abdominal surgeries, MVAs, or coccyx fractures. Pt has chronic LBP and it got better with working more but hasn't been working out as much and feels it creeping up again 2/2 fatigue.  SEXUAL FUNCTION: pt denied pain with intercourse, tampon insertion or OBGYN exam. No difficulty climaxing.   Fluid intake: constantly drinking something, starts with coffee, drinks water and coffee throughout the day, seltzer water drinks, no soda or juice, occasionally has hot tea. No alcohol-recovering alcoholic.   FUNCTIONAL LIMITATIONS: limits jumping or lifting 2/2 leakage and worries about leaking at work (used to be able to hold urine)  PERTINENT HISTORY:  Medications for current condition: tried Vesicare  for four days but got PNA and was very dried out Surgeries: not for this condition Other: Iron  deficiency anemia Chronic LBP, major depressive disorder, PMDD, anxiety, UI, getting lab work done-saw hematology yesterday and will have iron  infusions, hx of breast, breast augmentation  Sexual abuse:  No  PAIN:  Are you having pain? Yes 02/07/24 NPRS scale: 0/10  Back pain: 0/10 at best, at worst: achy 1-2/10  PRECAUTIONS: None  RED FLAGS: None   WEIGHT BEARING RESTRICTIONS: No  FALLS:  Has patient fallen in last 6 months? 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  ACTIVITY LEVEL : was working out by very fatigued but she still works out three times a week (tread, manufacturing systems engineer, scientist, research (physical sciences)) and she was going 4-5 days/week  PLOF: Independent  PATIENT GOALS: to stop peeing on myself-it's uncomfortable and embarrassing    BOWEL MOVEMENT: Pain with bowel movement: No Type of bowel movement:Frequency once a day Fully empty rectum: No not sure Leakage: No                                                  Caused by:  Bowel urgency: no Pads: No Fiber supplement/laxative No  URINATION: Pain with urination: No Fully empty bladder: Yes:                                           Post-void dribble: No Stream: Strong Urgency: Yes  Frequency:during the day every two hours or so                                                        Nocturia: Nonot lately   Leakage: Urge to void, Walking to the bathroom, Coughing, Sneezing, Laughing, Exercise, and Lifting Pads/briefs: Yes: three per day, pantyliners or light poise pads  INTERCOURSE:  Ability to have vaginal penetration Yes  Pain with intercourse: none Dryness:  No Climax: no issues Marinoff Scale: 0/3   PREGNANCY: Number of pregnancies: 4 Vaginal deliveries 3 (18, 14, 4)  Tearing Yes: she tore with all of them, first or second tears Episiotomy No C-section deliveries 0 Currently pregnant No  PROLAPSE: None   OBJECTIVE:  Note: Objective measures were completed at Evaluation unless otherwise noted.  COGNITION: Overall cognitive status: Within functional limits for tasks assessed     SENSATION: Light touch: Appears intact    FUNCTIONAL TESTS:   Single leg stance:  Rt: WNL  Lt: WNL Sit-up  test: Squat: Bed mobility:  GAIT: Assistive device utilized: None Comments: decr. Trunk rot and fluidity  POSTURE: increased lumbar lordosis and anterior pelvic tilt   LUMBARAROM/PROM: all WNL with a little mild discomfort of L side LBP  A/PROM A/PROM  Eval (% available)  Flexion   Extension   Right lateral flexion   Left lateral flexion   Right rotation   Left rotation    (Blank rows = not tested)  LOWER EXTREMITY ROM: all WNL except for limited B hip IR  Active ROM Right eval Left eval  Hip flexion    Hip extension    Hip abduction    Hip adduction    Hip internal rotation    Hip external rotation    Knee flexion    Knee extension    Ankle dorsiflexion    Ankle plantarflexion    Ankle inversion    Ankle eversion     (Blank rows = not tested)  LOWER EXTREMITY MMT: general B hip abd/add in seated: 4  MMT Right eval Left eval  Hip flexion 4+ 4+  Hip extension    Hip abduction 3+ 3+  Hip adduction 3+ 3+  Hip internal rotation 4- 4-  Hip external rotation 3+ 3+  Knee flexion 4+ 4+  Knee extension 5 5  Ankle dorsiflexion 5 5  Ankle plantarflexion    Ankle inversion    Ankle eversion     (Blank rows = not tested) PALPATION:  General: no TTP during palpation of cx-lx spine, hypomobility of tx spine noted, DR: two finger's width at umbilicus and inf. To umbilicus, about 1-1.5 finger's width sup. To umbilicus. Decr. Post. Rib expansion during inhalation. Glutes, hip add., and rectus abdominus activation noted during external, gloved palpation of PFM contraction/relaxation.  Pelvic Alignment: APT   PELVIC MMT:   MMT eval  Vaginal   Internal Anal Sphincter   External Anal Sphincter   Puborectalis   (Blank rows = not tested)        TONE: Incr. Tension in paraspinals  PROLAPSE: none   TODAY'S TREATMENT:                                                                                                                              DATE: 02/07/24     NMR: Access Code: [email or web address] URL: [email or web address] Date: 02/07/2024 Prepared by: Delon Pinal  Exercises: pt performed  2-5 reps of angels, open book, cat cow and diaphragmatic breathing in child's pose to ensure proper technique. - Supine Angels  - 1 x daily - 7 x weekly - 1 sets - 10 reps - Sidelying Open Book  - 1 x daily - 7 x weekly - 1 sets - 10 reps - Cat Cow  - 1 x daily - 7 x weekly - 1 sets - 5 reps - Diaphragmatic Breathing in Child's Pose with Pelvic Floor Relaxation  - 1 x daily - 7 x weekly - 1 sets - 5 reps - 30 hold - Seated Pelvic Floor Contraction  - 1-2 x daily - 7 x weekly - 1 sets - 10 reps - Single Arm Low Trap Setting at Wall  - 1 x daily - 3 x weekly - 3 sets - 10 reps - Clamshell  - 1 x daily - 3 x weekly - 3 sets - 10 reps with and without 10 # weight. Cues and demo for proper technique. S for safety. No pain.  MANUAL THERAPY: Internal PFM assessment: pt agreeable to internal vaginal assessment. Pt performed diaphragmatic breathing to improve relaxation of PFM 4/5 strength with pt able to hold 4-5 seconds out of 10 sec. during endurance contraction. Cues to improve PFM lengthening with inhalation vs. Contraction. After manual therapy: no pain reported. No trigger points noted during exam.  3/5 lower trap strength (B) 3+/5 rhomboid/mid trap strength (B)   SELF CARE:  PATIENT EDUCATION:  Education details: PT educated pt on internal muscle findings and new HEP and how tongue positioning can impact posture and grinding teeth at night. Tongue position: place tongue on the roof of your mouth, don't let tip of tongue touch your teeth. This is good for jaw position an overall posture. Person educated: Patient Education method: Explanation, Demonstration, and Handouts Education comprehension: verbalized understanding and needs further education  HOME EXERCISE PROGRAM: [email or web address]  ASSESSMENT:  CLINICAL IMPRESSION: Skilled session focused on  completing internal muscles assessment, fast twitch fiber strength 4/5 but endurance strength decr. Pt demonstrated progress as she reported improved leakage. 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 Pt The following impairments were noted upon exam: limited ROM, back pain, postural dysfunction, decr. Strength likely, and urgency and stress UI. Pt would continue to benefit from skilled PT to improve safety and decr. Pain during all ADLs.   OBJECTIVE IMPAIRMENTS: Abnormal gait, decreased coordination, decreased endurance, decreased strength, hypomobility, increased fascial restrictions, postural dysfunction, and pain.   ACTIVITY LIMITATIONS: carrying, lifting, bending, sitting, standing, squatting, transfers, continence, locomotion level, and caring for others  PARTICIPATION LIMITATIONS: meal prep, cleaning, laundry, interpersonal relationship, community activity, and occupation  PERSONAL FACTORS: 3+ comorbidities: see above and pt currently undergoing GI testing are also affecting patient's functional outcome.   REHAB POTENTIAL: Good  CLINICAL DECISION MAKING: Evolving/moderate complexity  EVALUATION COMPLEXITY: Moderate currently undergoing GI testing   GOALS: Goals reviewed with patient? Yes  SHORT TERM GOALS: Target date: for all STGs: 02/16/24  Pt will be IND in HEP to improve pain, strength, coordination. Baseline: no HEP but goes to gym 3x/week but used to go 4-5x/week Goal status: INITIAL  2.  Finish exam and write goals as indicated. Baseline: limited by time constraints Goal status: MET  3.  Pt will demo proper toileting posture to fully empty bladder and reduce straining during bowel movement.  Baseline: unable to demo Goal status: INITIAL  4.  Pt will demonstrated improved relaxation and contraction of PFM with coordination of breath to reduce  urinary leakage to </=three/week. Baseline: wears three pads a day 2/2 UI Goal status: INITIAL   LONG TERM GOALS: Target date: for all LTGs: 03/15/24  Pt will report going back to the gym 4-5x/week to maximize gains made in PT. Baseline: three times a week-decr. 2/2 fatigue and UI limits what she does Goal status: INITIAL  2.  Pt will demonstrated improved relaxation and contraction of PFM with coordination of breath to reduce urinary leakage to </=once/week. Baseline:  three pads a day Goal status: INITIAL  PLAN: progress strengthening, assess thoracolumbar junction, check STGs.    PT FREQUENCY: 1x/week  PT DURATION: 8 weeks  PLANNED INTERVENTIONS: 97110-Therapeutic exercises, 97530- Therapeutic activity, [id number]- Neuromuscular re-education, 97535- Self Care, [id number]- Manual therapy, and Patient/Family education   Eddi Hymes L, PT 02/07/2024, 11:03 AM  Delon Pinal, PT,DPT 02/07/24 11:03 AM Phone: [phone] Fax: [phone]

## 2024-02-13 ENCOUNTER — Other Ambulatory Visit: Payer: Self-pay

## 2024-02-13 ENCOUNTER — Ambulatory Visit: Attending: Family

## 2024-02-13 DIAGNOSIS — R2689 Other abnormalities of gait and mobility: Secondary | ICD-10-CM | POA: Diagnosis present

## 2024-02-13 DIAGNOSIS — M6281 Muscle weakness (generalized): Secondary | ICD-10-CM | POA: Diagnosis present

## 2024-02-13 DIAGNOSIS — N3941 Urge incontinence: Secondary | ICD-10-CM | POA: Diagnosis present

## 2024-02-13 DIAGNOSIS — R293 Abnormal posture: Secondary | ICD-10-CM | POA: Diagnosis present

## 2024-02-13 NOTE — Therapy (Signed)
 OUTPATIENT PHYSICAL THERAPY FEMALE PELVIC TREATMENT   Patient Name: Brittany Good MRN: [id number] DOB:25-Oct-1985, 38 y.o., female Today's Date: 02/13/2024  END OF SESSION:  PT End of Session - 02/13/24 1407     Visit Number 4    Number of Visits 9    Date for Recertification  03/19/24    Authorization Type [employer]    Progress Note Due on Visit 10    PT Start Time 1406   pt late   PT Stop Time 1444    PT Time Calculation (min) 38 min    Activity Tolerance Patient tolerated treatment well    Behavior During Therapy WFL for tasks assessed/performed          Past Medical History:  Diagnosis Date   Acne    Acute upper respiratory infection, unspecified 01/30/2023   Anemia    Depression    Miscarriage    Past Surgical History:  Procedure Laterality Date   BREAST ENHANCEMENT SURGERY     BREAST ENHANCEMENT SURGERY     2014   COSMETIC SURGERY  2014   Breast Augmentation   DILATION AND CURETTAGE OF UTERUS     2018   DILATION AND EVACUATION N/A 12/07/2016   Procedure: DILATATION AND EVACUATION;  Surgeon: Leonce Garnette BIRCH, MD;  Location: [facility];  Service: Gynecology;  Laterality: N/A;   EYE SURGERY  2009   Lasik   LASIK     LASIK     2009   TONSILLECTOMY     1992   TONSILLECTOMY AND ADENOIDECTOMY     Patient Active Problem List   Diagnosis Date Noted   Iron  deficiency 01/18/2024   Hair thinning 11/01/2023   Urge incontinence of urine 11/01/2023   MDD (major depressive disorder) 07/12/2022   PMDD (premenstrual dysphoric disorder) 07/12/2022   Social anxiety disorder 07/12/2022   Alcohol abuse, in remission 02/16/2022   Iron  deficiency anemia 05/11/2021   Chronic bilateral low back pain without sciatica 05/11/2021    PCP: Ginger Patrick FNP  REFERRING PROVIDER: Ginger Patrick FNP  REFERRING DIAG: UI  THERAPY DIAG:  Urge incontinence of urine  Muscle weakness (generalized)  Other abnormalities of gait and mobility  Abnormal  posture  Rationale for Evaluation and Treatment: Rehabilitation  ONSET DATE: 11/01/23 referral date but leakage started approx. One year ago  SUBJECTIVE:                                                                                                                                                                                           SUBJECTIVE STATEMENT: Pt reported she's doing well. She has not  done HEP as often but she has not had leakage. She has noticed L upper trap is always tense and elevated. She went to the movie and did not leak. Pt's dtr present today ( 38y/o).  EVAL: URINARY FUNCTION: pt voids very often and stream is strong, voids at least once every two hours. Pt reports SUI and urgency incontinence (more so than SUI). Pt denied pain with voiding. Pt hasn't been getting to void lately. Feels like she's fully emptying bladder.  BOWEL FUNCTION: at least once a day, mostly types 3 and 4. Pt denied pain with BMs. No fiber supplements, stool softeners, laxatives. Can't always tell if she's fully emptying the rectum.  CORE STABILITY: pt denied abdominal surgeries, MVAs, or coccyx fractures. Pt has chronic LBP and it got better with working more but hasn't been working out as much and feels it creeping up again 2/2 fatigue.  SEXUAL FUNCTION: pt denied pain with intercourse, tampon insertion or OBGYN exam. No difficulty climaxing.   Fluid intake: constantly drinking something, starts with coffee, drinks water and coffee throughout the day, seltzer water drinks, no soda or juice, occasionally has hot tea. No alcohol-recovering alcoholic.   FUNCTIONAL LIMITATIONS: limits jumping or lifting 2/2 leakage and worries about leaking at work (used to be able to hold urine)  PERTINENT HISTORY:  Medications for current condition: tried Vesicare  for four days but got PNA and was very dried out Surgeries: not for this condition Other: Iron  deficiency anemia Chronic LBP, major depressive disorder,  PMDD, anxiety, UI, getting lab work done-saw hematology yesterday and will have iron  infusions, hx of breast, breast augmentation  Sexual abuse: No  PAIN:  Are you having pain? Yes 02/13/24 NPRS scale: 0/10  Back pain: 0/10 at best, at worst: achy 1-2/10  PRECAUTIONS: None  RED FLAGS: None   WEIGHT BEARING RESTRICTIONS: No  FALLS:  Has patient fallen in last 6 months? 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  ACTIVITY LEVEL : was working out by very fatigued but she still works out three times a week (tread, manufacturing systems engineer, scientist, research (physical sciences)) and she was going 4-5 days/week  PLOF: Independent  PATIENT GOALS: to stop peeing on myself-it's uncomfortable and embarrassing    BOWEL MOVEMENT: Pain with bowel movement: No Type of bowel movement:Frequency once a day Fully empty rectum: No not sure Leakage: No                                                  Caused by:  Bowel urgency: no Pads: No Fiber supplement/laxative No  URINATION: Pain with urination: No Fully empty bladder: Yes:                                           Post-void dribble: No Stream: Strong Urgency: Yes  Frequency:during the day every two hours or so                                                        Nocturia: Nonot lately   Leakage: Urge to void, Walking to  the bathroom, Coughing, Sneezing, Laughing, Exercise, and Lifting Pads/briefs: Yes: three per day, pantyliners or light poise pads  INTERCOURSE:  Ability to have vaginal penetration Yes  Pain with intercourse: none Dryness: No Climax: no issues Marinoff Scale: 0/3   PREGNANCY: Number of pregnancies: 4 Vaginal deliveries 3 (18, 14, 4)  Tearing Yes: she tore with all of them, first or second tears Episiotomy No C-section deliveries 0 Currently pregnant No  PROLAPSE: None   OBJECTIVE:  Note: Objective measures were completed at Evaluation unless otherwise noted.  COGNITION: Overall cognitive status: Within functional  limits for tasks assessed     SENSATION: Light touch: Appears intact    FUNCTIONAL TESTS:   Single leg stance:  Rt: WNL  Lt: WNL Sit-up test: Squat: Bed mobility:  GAIT: Assistive device utilized: None Comments: decr. Trunk rot and fluidity  POSTURE: increased lumbar lordosis and anterior pelvic tilt   LUMBARAROM/PROM: all WNL with a little mild discomfort of L side LBP  A/PROM A/PROM  Eval (% available)  Flexion   Extension   Right lateral flexion   Left lateral flexion   Right rotation   Left rotation    (Blank rows = not tested)  LOWER EXTREMITY ROM: all WNL except for limited B hip IR  Active ROM Right eval Left eval  Hip flexion    Hip extension    Hip abduction    Hip adduction    Hip internal rotation    Hip external rotation    Knee flexion    Knee extension    Ankle dorsiflexion    Ankle plantarflexion    Ankle inversion    Ankle eversion     (Blank rows = not tested)  LOWER EXTREMITY MMT: general B hip abd/add in seated: 4  MMT Right eval Left eval  Hip flexion 4+ 4+  Hip extension    Hip abduction 3+ 3+  Hip adduction 3+ 3+  Hip internal rotation 4- 4-  Hip external rotation 3+ 3+  Knee flexion 4+ 4+  Knee extension 5 5  Ankle dorsiflexion 5 5  Ankle plantarflexion    Ankle inversion    Ankle eversion     (Blank rows = not tested) PALPATION:  General: no TTP during palpation of cx-lx spine, hypomobility of tx spine noted, DR: two finger's width at umbilicus and inf. To umbilicus, about 1-1.5 finger's width sup. To umbilicus. Decr. Post. Rib expansion during inhalation. Glutes, hip add., and rectus abdominus activation noted during external, gloved palpation of PFM contraction/relaxation.  Pelvic Alignment: APT   PELVIC MMT:   MMT eval  Vaginal   Internal Anal Sphincter   External Anal Sphincter   Puborectalis   (Blank rows = not tested)        TONE: Incr. Tension in paraspinals  PROLAPSE: none   TODAY'S  TREATMENT:  DATE: 02/13/24    THEREX: progressed to therex from NMR Access Code: [email or web address] URL: [email or web address] Date: 02/13/2024 Prepared by: Delon Pinal  Exercises: pt performed 3-5 reps of previous HEP to ensure IND. - Supine Angels  - 1 x daily - 7 x weekly - 1 sets - 10 reps - Sidelying Open Book  - 1 x daily - 7 x weekly - 1 sets - 10 reps - Cat Cow  - 1 x daily - 7 x weekly - 1 sets - 5 reps - Diaphragmatic Breathing in Child's Pose with Pelvic Floor Relaxation  - 1 x daily - 7 x weekly - 1 sets - 5 reps - 30 hold - Seated Pelvic Floor Contraction  - 1-2 x daily - 7 x weekly - 1 sets - 10 reps - Single Arm Low Trap Setting at Wall  - 1 x daily - 3 x weekly - 3 sets - 10 reps - Clamshell  - 1 x daily - 3 x weekly - 3 sets - 10 reps  - Bent Over Single Arm Shoulder Row with Dumbbell  - 1 x daily - 3 x weekly - 3 sets - 10 reps - Dead Bug  - 1 x daily - 3 x weekly - 4 sets - 5 reps with overhead with weights and press with weights variations. Cues and demo for proper technique. S for safety. No pain.    SELF CARE:  PATIENT EDUCATION:  Education details: PT educated pt goal progress and how to progress hip abd/add and weight at gym. Person educated: Patient Education method: Explanation, Demonstration, and Handouts Education comprehension: verbalized understanding and needs further education  HOME EXERCISE PROGRAM: [email or web address]  ASSESSMENT:  CLINICAL IMPRESSION: Skilled session focused on assessing goals, all STGs met-no leakage since last visit. Pt demonstrated improved strength and she was able to tolerated progressed core, hip, and LEs with weights. Pt continues to require minimal cues but progressing to IND quickly.  Pt The following impairments were noted upon exam: limited ROM, back pain, postural dysfunction, decr. Strength  likely, and urgency and stress UI. Pt would continue to benefit from skilled PT to improve safety and decr. Pain during all ADLs.   OBJECTIVE IMPAIRMENTS: Abnormal gait, decreased coordination, decreased endurance, decreased strength, hypomobility, increased fascial restrictions, postural dysfunction, and pain.   ACTIVITY LIMITATIONS: carrying, lifting, bending, sitting, standing, squatting, transfers, continence, locomotion level, and caring for others  PARTICIPATION LIMITATIONS: meal prep, cleaning, laundry, interpersonal relationship, community activity, and occupation  PERSONAL FACTORS: 3+ comorbidities: see above and pt currently undergoing GI testing are also affecting patient's functional outcome.   REHAB POTENTIAL: Good  CLINICAL DECISION MAKING: Evolving/moderate complexity  EVALUATION COMPLEXITY: Moderate currently undergoing GI testing   GOALS: Goals reviewed with patient? Yes  SHORT TERM GOALS: Target date: for all STGs: 02/16/24  Pt will be IND in HEP to improve pain, strength, coordination. Baseline: no HEP but goes to gym 3x/week but used to go 4-5x/week Goal status: MET  2.  Finish exam and write goals as indicated. Baseline: limited by time constraints Goal status: MET  3.  Pt will demo proper toileting posture to fully empty bladder and reduce straining during bowel movement. Baseline: unable to demo 12/1: able to demo  Goal status: MET   4.  Pt will demonstrated improved relaxation and contraction of PFM with coordination of breath to reduce urinary leakage to </=three/week. Baseline: wears three pads a day 2/2 UI; 12/1: no leakage this last week, only wearing a pantyliner  Goal status: MET   LONG TERM GOALS: Target date: for all LTGs: 03/15/24  Pt will report going back to the gym 4-5x/week to maximize gains made in PT. Baseline: three times a week-decr. 2/2 fatigue and UI limits what she does Goal status: INITIAL  2.  Pt will demonstrated improved  relaxation and contraction of PFM with coordination of breath to reduce urinary leakage to </=once/week. Baseline:  three pads a day Goal status: INITIAL  PLAN: continue to progress strengthening, assess thoracolumbar junction   PT FREQUENCY: 1x/week  PT DURATION: 8 weeks  PLANNED INTERVENTIONS: 97110-Therapeutic exercises, 97530- Therapeutic activity, [id number]- Neuromuscular re-education, 97535- Self Care, [id number]- Manual therapy, and Patient/Family education   Lukis Bunt L, PT 02/13/2024, 2:08 PM  Delon Pinal, PT,DPT 02/13/24 2:08 PM Phone: [phone] Fax: [phone]

## 2024-02-14 ENCOUNTER — Ambulatory Visit

## 2024-02-20 ENCOUNTER — Other Ambulatory Visit: Payer: Self-pay

## 2024-02-20 DIAGNOSIS — F900 Attention-deficit hyperactivity disorder, predominantly inattentive type: Secondary | ICD-10-CM | POA: Diagnosis not present

## 2024-02-20 DIAGNOSIS — F330 Major depressive disorder, recurrent, mild: Secondary | ICD-10-CM | POA: Diagnosis not present

## 2024-02-20 MED ORDER — METHYLPHENIDATE HCL ER (OSM) 18 MG PO TBCR
18.0000 mg | EXTENDED_RELEASE_TABLET | Freq: Every morning | ORAL | 0 refills | Status: AC
  Filled 2024-02-20: qty 14, 14d supply, fill #0

## 2024-02-20 MED ORDER — LISDEXAMFETAMINE DIMESYLATE 30 MG PO CAPS
30.0000 mg | ORAL_CAPSULE | Freq: Every morning | ORAL | 0 refills | Status: AC
  Filled 2024-02-20: qty 14, 14d supply, fill #0

## 2024-02-23 DIAGNOSIS — L680 Hirsutism: Secondary | ICD-10-CM | POA: Diagnosis not present

## 2024-02-23 DIAGNOSIS — N946 Dysmenorrhea, unspecified: Secondary | ICD-10-CM | POA: Diagnosis not present

## 2024-02-28 ENCOUNTER — Ambulatory Visit

## 2024-02-28 ENCOUNTER — Other Ambulatory Visit: Payer: Self-pay

## 2024-02-28 DIAGNOSIS — R2689 Other abnormalities of gait and mobility: Secondary | ICD-10-CM

## 2024-02-28 DIAGNOSIS — N3941 Urge incontinence: Secondary | ICD-10-CM

## 2024-02-28 DIAGNOSIS — M6281 Muscle weakness (generalized): Secondary | ICD-10-CM

## 2024-02-28 DIAGNOSIS — R293 Abnormal posture: Secondary | ICD-10-CM

## 2024-02-28 NOTE — Therapy (Addendum)
 OUTPATIENT PHYSICAL THERAPY FEMALE PELVIC TREATMENT   Patient Name: Brittany Good MRN: [id number] DOB:1985/04/08, 38 y.o., female Today's Date: 02/28/2024  END OF SESSION:  PT End of Session - 02/28/24 0935     Visit Number 5    Number of Visits 9    Date for Recertification  03/19/24    Authorization Type [employer]    Progress Note Due on Visit 10    PT Start Time 0933    PT Stop Time 1012    PT Time Calculation (min) 39 min    Activity Tolerance Patient tolerated treatment well    Behavior During Therapy [facility] for tasks assessed/performed          Past Medical History:  Diagnosis Date   Acne    Acute upper respiratory infection, unspecified 01/30/2023   Anemia    Depression    Miscarriage    Past Surgical History:  Procedure Laterality Date   BREAST ENHANCEMENT SURGERY     BREAST ENHANCEMENT SURGERY     2014   COSMETIC SURGERY  2014   Breast Augmentation   DILATION AND CURETTAGE OF UTERUS     2018   DILATION AND EVACUATION N/A 12/07/2016   Procedure: DILATATION AND EVACUATION;  Surgeon: Leonce Garnette BIRCH, MD;  Location: [facility] ORS;  Service: Gynecology;  Laterality: N/A;   EYE SURGERY  2009   Lasik   LASIK     LASIK     2009   TONSILLECTOMY     1992   TONSILLECTOMY AND ADENOIDECTOMY     Patient Active Problem List   Diagnosis Date Noted   Iron  deficiency 01/18/2024   Hair thinning 11/01/2023   Urge incontinence of urine 11/01/2023   MDD (major depressive disorder) 07/12/2022   PMDD (premenstrual dysphoric disorder) 07/12/2022   Social anxiety disorder 07/12/2022   Alcohol abuse, in remission 02/16/2022   Iron  deficiency anemia 05/11/2021   Chronic bilateral low back pain without sciatica 05/11/2021    PCP: Ginger Patrick FNP  REFERRING PROVIDER: Ginger Patrick FNP  REFERRING DIAG: UI  THERAPY DIAG:  Urge incontinence of urine  Muscle weakness (generalized)  Other abnormalities of gait and mobility  Abnormal  posture  Rationale for Evaluation and Treatment: Rehabilitation  ONSET DATE: 11/01/23 referral date but leakage started approx. One year ago  SUBJECTIVE:                                                                                                                                                                                           SUBJECTIVE STATEMENT: Pt reported she's doing well. HEP is going well. She's had  two minimal leaks since last visit, likely waited too long to go to the bathroom. She feels sore today and L upper trap wants to activate at all times and is sore.    EVAL: URINARY FUNCTION: pt voids very often and stream is strong, voids at least once every two hours. Pt reports SUI and urgency incontinence (more so than SUI). Pt denied pain with voiding. Pt hasn't been getting to void lately. Feels like she's fully emptying bladder.  BOWEL FUNCTION: at least once a day, mostly types 3 and 4. Pt denied pain with BMs. No fiber supplements, stool softeners, laxatives. Can't always tell if she's fully emptying the rectum.  CORE STABILITY: pt denied abdominal surgeries, MVAs, or coccyx fractures. Pt has chronic LBP and it got better with working more but hasn't been working out as much and feels it creeping up again 2/2 fatigue.  SEXUAL FUNCTION: pt denied pain with intercourse, tampon insertion or OBGYN exam. No difficulty climaxing.   Fluid intake: constantly drinking something, starts with coffee, drinks water and coffee throughout the day, seltzer water drinks, no soda or juice, occasionally has hot tea. No alcohol-recovering alcoholic.   FUNCTIONAL LIMITATIONS: limits jumping or lifting 2/2 leakage and worries about leaking at work (used to be able to hold urine)  PERTINENT HISTORY:  Medications for current condition: tried Vesicare  for four days but got PNA and was very dried out Surgeries: not for this condition Other: Iron  deficiency anemia Chronic LBP, major depressive  disorder, PMDD, anxiety, UI, getting lab work done-saw hematology yesterday and will have iron  infusions, hx of breast, breast augmentation  Sexual abuse: No  PAIN:  Are you having pain? Yes 02/28/24 NPRS scale: 2-3/10 L shoulder/upper trap pain  Back pain: 0/10 at best, at worst: achy 1-2/10  PRECAUTIONS: None  RED FLAGS: None   WEIGHT BEARING RESTRICTIONS: No  FALLS:  Has patient fallen in last 6 months? 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  ACTIVITY LEVEL : was working out by very fatigued but she still works out three times a week (tread, manufacturing systems engineer, scientist, research (physical sciences)) and she was going 4-5 days/week  PLOF: Independent  PATIENT GOALS: to stop peeing on myself-it's uncomfortable and embarrassing    BOWEL MOVEMENT: Pain with bowel movement: No Type of bowel movement:Frequency once a day Fully empty rectum: No not sure Leakage: No                                                  Caused by:  Bowel urgency: no Pads: No Fiber supplement/laxative No  URINATION: Pain with urination: No Fully empty bladder: Yes:                                           Post-void dribble: No Stream: Strong Urgency: Yes  Frequency:during the day every two hours or so                                                        Nocturia: Nonot lately   Leakage: Urge to void,  Walking to the bathroom, Coughing, Sneezing, Laughing, Exercise, and Lifting Pads/briefs: Yes: three per day, pantyliners or light poise pads  INTERCOURSE:  Ability to have vaginal penetration Yes  Pain with intercourse: none Dryness: No Climax: no issues Marinoff Scale: 0/3   PREGNANCY: Number of pregnancies: 4 Vaginal deliveries 3 (18, 14, 4)  Tearing Yes: she tore with all of them, first or second tears Episiotomy No C-section deliveries 0 Currently pregnant No  PROLAPSE: None   OBJECTIVE:  Note: Objective measures were completed at Evaluation unless otherwise noted.  COGNITION: Overall  cognitive status: Within functional limits for tasks assessed     SENSATION: Light touch: Appears intact    FUNCTIONAL TESTS:   Single leg stance:  Rt: WNL  Lt: WNL Sit-up test: Squat: Bed mobility:  GAIT: Assistive device utilized: None Comments: decr. Trunk rot and fluidity  POSTURE: increased lumbar lordosis and anterior pelvic tilt   LUMBARAROM/PROM: all WNL with a little mild discomfort of L side LBP  A/PROM A/PROM  Eval (% available)  Flexion   Extension   Right lateral flexion   Left lateral flexion   Right rotation   Left rotation    (Blank rows = not tested)  LOWER EXTREMITY ROM: all WNL except for limited B hip IR  Active ROM Right eval Left eval  Hip flexion    Hip extension    Hip abduction    Hip adduction    Hip internal rotation    Hip external rotation    Knee flexion    Knee extension    Ankle dorsiflexion    Ankle plantarflexion    Ankle inversion    Ankle eversion     (Blank rows = not tested)  LOWER EXTREMITY MMT: general B hip abd/add in seated: 4  MMT Right eval Left eval  Hip flexion 4+ 4+  Hip extension    Hip abduction 3+ 3+  Hip adduction 3+ 3+  Hip internal rotation 4- 4-  Hip external rotation 3+ 3+  Knee flexion 4+ 4+  Knee extension 5 5  Ankle dorsiflexion 5 5  Ankle plantarflexion    Ankle inversion    Ankle eversion     (Blank rows = not tested) PALPATION:  General: no TTP during palpation of cx-lx spine, hypomobility of tx spine noted, DR: two finger's width at umbilicus and inf. To umbilicus, about 1-1.5 finger's width sup. To umbilicus. Decr. Post. Rib expansion during inhalation. Glutes, hip add., and rectus abdominus activation noted during external, gloved palpation of PFM contraction/relaxation.  Pelvic Alignment: APT   PELVIC MMT:   MMT eval  Vaginal   Internal Anal Sphincter   External Anal Sphincter   Puborectalis   (Blank rows = not tested)        TONE: Incr. Tension in  paraspinals  PROLAPSE: none   TODAY'S TREATMENT:  DATE: 02/28/24    THEREX:  Access Code: [email or web address] URL: [email or web address] Date: 02/28/2024 Prepared by: Delon Pinal  Exercises - Bent Over Single Arm Shoulder Row- 1 x daily - 3 x weekly -2 reps/side in lunge and bent at hips vs. Table to ensure proper technique per pt request. -Lower trap wall exercises x 3 reps/side with PT palpating and cues to decr. R upper trap activation - Seated Upper Trapezius Stretch  - 1 x daily - 7 x weekly - 1 sets - 3 reps - 30-45 hold -B shoulder rolls x5 reps/side Cues and demo for proper technique. S for safety. No pain reported.  MANUAL THERAPY: PT assessed B upper traps for trigger points and performed STM to L upper trap prior to dry needling. Pt reported incr. Tension and soreness in L upper trap.  Dry needling: TDN Treatment:  Patient consent: yes signed After explanation of TDN Rationale, Procedures, outcomes, and potential side effects, patient verbalized consent to TDN treatment.  Region/Dx:  Muscles Treated: L upper trap with .30x72mm in two locations   Post treatment pain/response: twitch response and mild soreness that eased with stretches and movement. Post treatment Instructions: Patient instructed to expect mild to moderate muscle soreness this evening and tomorrow. Patient instructed to continued prescribed home exercise program. If dry needling over lung field, patient instructed of signs and symptoms of pneumothorax. Patient also educated on signs and symptoms of infection, however unlikely, and to seek immediate medical attention shoulder thy occur. Patient verbalized understanding of these instructions.      SELF CARE:  PATIENT EDUCATION:  Education details: PT educated pt on TDN-see above. Pt agreeable. PT provided cues and  modifications for HEP prn. Person educated: Patient Education method: Explanation, Demonstration, and Handouts Education comprehension: verbalized understanding and needs further education  HOME EXERCISE PROGRAM: [email or web address]  ASSESSMENT:  CLINICAL IMPRESSION: Skilled session focused on assessing B upper traps and TDN to decr. L upper trap and over activation during all activities. Pt reported decr. Tension after manual therapy and TDN. Overall pt demonstrating progress as she's experiencing less leakage and able to perform strengthening without incr. Leakage.  Pt The following impairments were noted upon exam: limited ROM, back pain, postural dysfunction, decr. Strength likely, and urgency and stress UI. Pt would continue to benefit from skilled PT to improve safety and decr. Pain during all ADLs.   OBJECTIVE IMPAIRMENTS: Abnormal gait, decreased coordination, decreased endurance, decreased strength, hypomobility, increased fascial restrictions, postural dysfunction, and pain.   ACTIVITY LIMITATIONS: carrying, lifting, bending, sitting, standing, squatting, transfers, continence, locomotion level, and caring for others  PARTICIPATION LIMITATIONS: meal prep, cleaning, laundry, interpersonal relationship, community activity, and occupation  PERSONAL FACTORS: 3+ comorbidities: see above and pt currently undergoing GI testing are also affecting patient's functional outcome.   REHAB POTENTIAL: Good  CLINICAL DECISION MAKING: Evolving/moderate complexity  EVALUATION COMPLEXITY: Moderate currently undergoing GI testing   GOALS: Goals reviewed with patient? Yes  SHORT TERM GOALS: Target date: for all STGs: 02/16/24  Pt will be IND in HEP to improve pain, strength, coordination. Baseline: no HEP but goes to gym 3x/week but used to go 4-5x/week Goal status: MET  2.  Finish exam and write goals as indicated. Baseline: limited by time constraints Goal status: MET  3.  Pt will demo proper  toileting posture to fully empty bladder and reduce straining during bowel movement. Baseline: unable to demo 12/1: able to demo  Goal status: MET   4.  Pt will demonstrated improved relaxation and  contraction of PFM with coordination of breath to reduce urinary leakage to </=three/week. Baseline: wears three pads a day 2/2 UI; 12/1: no leakage this last week, only wearing a pantyliner  Goal status: MET   LONG TERM GOALS: Target date: for all LTGs: 03/15/24  Pt will report going back to the gym 4-5x/week to maximize gains made in PT. Baseline: three times a week-decr. 2/2 fatigue and UI limits what she does Goal status: INITIAL  2.  Pt will demonstrated improved relaxation and contraction of PFM with coordination of breath to reduce urinary leakage to </=once/week. Baseline:  three pads a day Goal status: INITIAL  PLAN: assess L upper trap and TDN prn, continue to progress strengthening (dead bug, side planks, bear plank), assess B scaps and thoracolumbar junction   PT FREQUENCY: 1x/week  PT DURATION: 8 weeks  PLANNED INTERVENTIONS: 97110-Therapeutic exercises, 97530- Therapeutic activity, [id number]- Neuromuscular re-education, 97535- Self Care, [id number]- Manual therapy, and Patient/Family education, Dry needling   Declyn Delsol L, PT 02/28/2024, 9:36 AM  Delon Pinal, PT,DPT 02/28/2024 9:36 AM Phone: [phone] Fax: [phone]

## 2024-03-02 ENCOUNTER — Other Ambulatory Visit: Payer: Self-pay | Admitting: Medical Genetics

## 2024-03-05 ENCOUNTER — Ambulatory Visit

## 2024-03-05 ENCOUNTER — Other Ambulatory Visit

## 2024-03-05 NOTE — H&P (Signed)
 "  Clotilda Schaffer, MD [facility] [address], [address] [place], [place] [address] [phone] Fax : [phone]  Primary Care Physician:  Corwin Antu, FNP Primary Gastroenterologist:  Dr. Schaffer  Pre-Procedure History & Physical: HPI:  Brittany Good is a 38 y.o. female is here for an endoscopy and colonoscopy.  Prior procedure? Fhx CRC? Blood thinners? No   Past Medical History:  Diagnosis Date   Acne    Acute upper respiratory infection, unspecified 01/30/2023   Anemia    Depression    Miscarriage     Past Surgical History:  Procedure Laterality Date   BREAST ENHANCEMENT SURGERY     BREAST ENHANCEMENT SURGERY     2014   COSMETIC SURGERY  2014   Breast Augmentation   DILATION AND CURETTAGE OF UTERUS     2018   DILATION AND EVACUATION N/A 12/07/2016   Procedure: DILATATION AND EVACUATION;  Surgeon: Leonce Garnette BIRCH, MD;  Location: [facility] ORS;  Service: Gynecology;  Laterality: N/A;   EYE SURGERY  2009   Lasik   LASIK     LASIK     2009   TONSILLECTOMY     1992   TONSILLECTOMY AND ADENOIDECTOMY      Prior to Admission medications  Medication Sig Start Date End Date Taking? Authorizing Provider  FLUoxetine  (PROZAC ) 20 MG capsule Take 1 capsule (20 mg total) by mouth daily. 01/16/24     lisdexamfetamine  (VYVANSE ) 30 MG capsule Take 1 capsule by mouth every morning for 14 days 02/20/24     methylphenidate  (CONCERTA ) 18 MG PO CR tablet Take 1 tablet by mouth every morning for 14 days 02/20/24     Multiple Vitamins-Minerals (WOMENS MULTIVITAMIN PLUS PO) Take by mouth.    [provider]    Allergies as of 01/31/2024   (No Known Allergies)    Family History  Problem Relation Age of Onset   Healthy Mother    Healthy Father    Alcohol abuse Brother    Cancer Maternal Grandmother        ? type   Diabetes Maternal Grandfather    Cancer Maternal Grandfather        unknown   Cancer Paternal Grandmother        lung smoker    Depression Paternal  Grandfather    Breast cancer Neg Hx     Social History   Socioeconomic History   Marital status: Married    Spouse name: Not on file   Number of children: 3   Years of education: Not on file   Highest education level: Bachelor's degree (e.g., BA, AB, BS)  Occupational History   Occupation: labor delivery nurse, RN  Tobacco Use   Smoking status: Never   Smokeless tobacco: Never  Vaping Use   Vaping status: Never Used  Substance and Sexual Activity   Alcohol use: Not Currently    Alcohol/week: 6.0 standard drinks of alcohol    Types: 6 Glasses of wine per week    Comment: , occasionally   Drug use: No   Sexual activity: Yes    Birth control/protection: Other-see comments, None    Comment: Husband had vasectomy  Other Topics Concern   Not on file  Social History Narrative   Married    2 kids girls    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 is runner, broadcasting/film/video    Likes to read    Wears seat belt, no guns, safe in relationship    Moved from [facility] in  2017 to [place]   Social Drivers of Health   Tobacco Use: Low Risk (02/28/2024)   Patient History    Smoking Tobacco Use: Never    Smokeless Tobacco Use: Never    Passive Exposure: Not on file  Financial Resource Strain: Low Risk (10/28/2023)   Overall Financial Resource Strain (CARDIA)    Difficulty of Paying Living Expenses: Not very hard  Food Insecurity: No Food Insecurity (01/18/2024)   Epic    Worried About Programme Researcher, Broadcasting/film/video in the Last Year: Never true    Ran Out of Food in the Last Year: Never true  Transportation Needs: No Transportation Needs (01/18/2024)   Epic    Lack of Transportation (Medical): No    Lack of Transportation (Non-Medical): No  Physical Activity: Insufficiently Active (10/28/2023)   Exercise Vital Sign    Days of Exercise per Week: 3 days    Minutes of Exercise per Session: 30 min  Stress: Stress Concern Present (10/28/2023)   [employer] of Occupational Health - Occupational Stress Questionnaire    Feeling of  Stress: To some extent  Social Connections: Moderately Isolated (10/28/2023)   Social Connection and Isolation Panel    Frequency of Communication with Friends and Family: More than three times a week    Frequency of Social Gatherings with Friends and Family: Twice a week    Attends Religious Services: Never    Database Administrator or Organizations: No    Attends Engineer, Structural: Not on file    Marital Status: Married  Catering Manager Violence: Not At Risk (01/18/2024)   Epic    Fear of Current or Ex-Partner: No    Emotionally Abused: No    Physically Abused: No    Sexually Abused: No  Depression (PHQ2-9): Medium Risk (01/18/2024)   Depression (PHQ2-9)    PHQ-2 Score: 8  Alcohol Screen: Not on file  Housing: Low Risk (01/18/2024)   Epic    Unable to Pay for Housing in the Last Year: No    Number of Times Moved in the Last Year: 0    Homeless in the Last Year: No  Utilities: Not At Risk (01/18/2024)   Epic    Threatened with loss of utilities: No  Health Literacy: Not on file    Review of Systems: See HPI, otherwise negative ROS  Physical Exam: There were no vitals taken for this visit. CONSTITUTIONAL: Well-appearing in no acute distress.  HEENT: Pupils equal, round, Extraocular movements intact. Conjunctivae clear NECK: Neck supple CARDIOVASCULAR: Regular rate, no LE edema  RESPIRATORY: No labored breathing  ABDOMEN: Abdomen soft, nontender, not distended, no guarding, no rigidity SKIN: No apparent skin rashes or lesions. NEUROLOGIC: Normal speech, no focal findings. Mental status alert and oriented x4. PSYCHIATRIC: Mood and affect normal.   Impression/Plan: Brittany Good is here for an endoscopy and colonoscopy to be performed for Fe deficiency anemia.  Risks, benefits, limitations, and alternatives regarding  endoscopy and colonoscopy have been reviewed with the patient.  Questions have been answered.  All parties agreeable.   Brittany CLOTILDA HERO,  MD  03/05/2024, 1:14 PM  "

## 2024-03-06 ENCOUNTER — Ambulatory Visit
Admission: RE | Admit: 2024-03-06 | Discharge: 2024-03-06 | Disposition: A | Discharge status: Home / Self Care | Attending: Gastroenterology | Admitting: Gastroenterology

## 2024-03-06 ENCOUNTER — Encounter: Payer: Self-pay | Admitting: Gastroenterology

## 2024-03-06 ENCOUNTER — Ambulatory Visit: Admitting: Anesthesiology

## 2024-03-06 ENCOUNTER — Ambulatory Visit

## 2024-03-06 ENCOUNTER — Encounter: Admission: RE | Disposition: A | Payer: Self-pay | Attending: Gastroenterology

## 2024-03-06 DIAGNOSIS — K640 First degree hemorrhoids: Secondary | ICD-10-CM | POA: Insufficient documentation

## 2024-03-06 DIAGNOSIS — K2289 Other specified disease of esophagus: Secondary | ICD-10-CM

## 2024-03-06 DIAGNOSIS — K529 Noninfective gastroenteritis and colitis, unspecified: Secondary | ICD-10-CM | POA: Diagnosis not present

## 2024-03-06 DIAGNOSIS — D509 Iron deficiency anemia, unspecified: Secondary | ICD-10-CM | POA: Diagnosis not present

## 2024-03-06 DIAGNOSIS — K222 Esophageal obstruction: Secondary | ICD-10-CM | POA: Insufficient documentation

## 2024-03-06 DIAGNOSIS — D122 Benign neoplasm of ascending colon: Secondary | ICD-10-CM | POA: Diagnosis not present

## 2024-03-06 DIAGNOSIS — D508 Other iron deficiency anemias: Secondary | ICD-10-CM

## 2024-03-06 DIAGNOSIS — K635 Polyp of colon: Secondary | ICD-10-CM

## 2024-03-06 DIAGNOSIS — K6389 Other specified diseases of intestine: Secondary | ICD-10-CM | POA: Diagnosis not present

## 2024-03-06 HISTORY — PX: BIOPSY OF SKIN SUBCUTANEOUS TISSUE AND/OR MUCOUS MEMBRANE: SHX6741

## 2024-03-06 HISTORY — PX: ESOPHAGOGASTRODUODENOSCOPY: SHX5428

## 2024-03-06 HISTORY — PX: POLYPECTOMY: SHX149

## 2024-03-06 HISTORY — PX: COLONOSCOPY: SHX5424

## 2024-03-06 SURGERY — COLONOSCOPY
Anesthesia: General

## 2024-03-06 MED ORDER — PROPOFOL 1000 MG/100ML IV EMUL
INTRAVENOUS | Status: AC
  Filled 2024-03-06: qty 100

## 2024-03-06 MED ORDER — LIDOCAINE HCL (PF) 2 % IJ SOLN
INTRAMUSCULAR | Status: AC
  Filled 2024-03-06: qty 15

## 2024-03-06 MED ORDER — PROPOFOL 10 MG/ML IV BOLUS
INTRAVENOUS | Status: DC | PRN
  Administered 2024-03-06: 60 mg via INTRAVENOUS
  Administered 2024-03-06: 30 mg via INTRAVENOUS
  Administered 2024-03-06: 80 mg via INTRAVENOUS

## 2024-03-06 MED ORDER — SODIUM CHLORIDE 0.9 % IV SOLN: INTRAVENOUS | Status: DC

## 2024-03-06 MED ORDER — PROPOFOL 500 MG/50ML IV EMUL
INTRAVENOUS | Status: DC | PRN
  Administered 2024-03-06: 150 ug/kg/min via INTRAVENOUS

## 2024-03-06 MED ORDER — LIDOCAINE HCL (CARDIAC) PF 100 MG/5ML IV SOSY
PREFILLED_SYRINGE | INTRAVENOUS | Status: DC | PRN
  Administered 2024-03-06: 90 mg via INTRAVENOUS

## 2024-03-06 NOTE — Op Note (Signed)
 [facility]astroenterology Patient Name: Brittany Good Procedure Date: 03/06/2024 7:16 AM MRN: [id number] Account #: [id number] Date of Birth: [birth year] Admit Type: Outpatient Age: 38 Room: [facility] ENDO ROOM 2 Gender: Female Note Status: Finalized Instrument Name: Colon Scope [id number] Procedure:             Colonoscopy Indications:           Unexplained iron  deficiency anemia Providers:             Clotilda Schaffer, MD Referring MD:          Ginger Patrick (Referring MD) Medicines:             Propofol  per Anesthesia Complications:         No immediate complications. Procedure:             Pre-Anesthesia Assessment:                        - Prior to the procedure, a History and Physical was                         performed, and patient medications and allergies were                         reviewed. The patient's tolerance of previous                         anesthesia was also reviewed. The risks and benefits                         of the procedure and the sedation options and risks                         were discussed with the patient. All questions were                         answered, and informed consent was obtained. Prior                         Anticoagulants: The patient has taken no anticoagulant                         or antiplatelet agents. ASA Grade Assessment: II - A                         patient with mild systemic disease. After reviewing                         the risks and benefits, the patient was deemed in                         satisfactory condition to undergo the procedure.                        After obtaining informed consent, the colonoscope was                         passed under direct vision. Throughout the procedure,  the patient's blood pressure, pulse, and oxygen                         saturations were monitored continuously. The was                         introduced through the anus and advanced  to the 10 cm                         into the ileum. The colonoscopy was performed without                         difficulty. The patient tolerated the procedure well.                         The quality of the bowel preparation was good. The                         terminal ileum, ileocecal valve, appendiceal orifice,                         and rectum were photographed. Findings:      A 5 mm polyp was found in the ascending colon. The polyp was sessile.       The polyp was removed with a cold snare. Resection and retrieval were       complete. Estimated blood loss: none.      Internal hemorrhoids were found during retroflexion. The hemorrhoids       were Grade I (internal hemorrhoids that do not prolapse).      The terminal ileum contained a few non-bleeding aphthae. No stigmata of       recent bleeding were seen. Biopsies were taken with a cold forceps for       histology. Estimated blood loss: none. Impression:            - One 5 mm polyp in the ascending colon, removed with                         a cold snare. Resected and retrieved.                        - Internal hemorrhoids.                        - Aphtha in the terminal ileum. Biopsied. Recommendation:        - Patient has a contact number available for                         emergencies. The signs and symptoms of potential                         delayed complications were discussed with the patient.                         Return to normal activities tomorrow. Written                         discharge instructions were provided to the patient.                        -  High fiber diet.                        - Continue present medications.                        - Await pathology results.                        - No aspirin, ibuprofen , naproxen , or other                         non-steroidal anti-inflammatory drugs.                        - Repeat colonoscopy in 5-10 years for surveillance                         based  on pathology results.                        - The findings and recommendations were discussed with                         the designated responsible adult. Procedure Code(s):     --- Professional ---                        [phone], Colonoscopy, flexible; with removal of                         tumor(s), polyp(s), or other lesion(s) by snare                         technique                        45380, 59, Colonoscopy, flexible; with biopsy, single                         or multiple Diagnosis Code(s):     --- Professional ---                        D12.2, Benign neoplasm of ascending colon                        K63.89, Other specified diseases of intestine                        K64.0, First degree hemorrhoids                        D50.9, Iron  deficiency anemia, unspecified CPT copyright 2022 American Medical Association. All rights reserved. The codes documented in this report are preliminary and upon coder review may  be revised to meet current compliance requirements. Clotilda Schaffer, MD 03/06/2024 8:11:02 AM Number of Addenda: 0 Note Initiated On: 03/06/2024 7:16 AM Scope Withdrawal Time: 0 hours 10 minutes 49 seconds  Total Procedure Duration: 0 hours 14 minutes 23 seconds  Estimated Blood Loss:  Estimated blood loss: none.      [facility]

## 2024-03-06 NOTE — Transfer of Care (Signed)
 Immediate Anesthesia Transfer of Care Note  Patient: Brittany Good  Procedure(s) Performed: COLONOSCOPY EGD (ESOPHAGOGASTRODUODENOSCOPY) POLYPECTOMY, INTESTINE  Patient Location: Endoscopy Unit  Anesthesia Type:General  Level of Consciousness: alert   Airway & Oxygen Therapy: Patient Spontanous Breathing  Post-op Assessment: Report given to RN and Post -op Vital signs reviewed and stable  Post vital signs: Reviewed and stable  Last Vitals:  Vitals Value Taken Time  BP 106/81   Temp    Pulse 68   Resp 15 03/06/24 08:12  SpO2 100%   Vitals shown include unfiled device data.  Last Pain:  Vitals:   03/06/24 0721  PainSc: 0-No pain         Complications: No notable events documented.

## 2024-03-06 NOTE — Anesthesia Postprocedure Evaluation (Signed)
"   Anesthesia Post Note  Patient: Brittany Good  Procedure(s) Performed: COLONOSCOPY EGD (ESOPHAGOGASTRODUODENOSCOPY) POLYPECTOMY, INTESTINE  Patient location during evaluation: PACU Anesthesia Type: General Level of consciousness: awake Pain management: satisfactory to patient Vital Signs Assessment: post-procedure vital signs reviewed and stable Respiratory status: spontaneous breathing Cardiovascular status: stable Anesthetic complications: no   No notable events documented.   Last Vitals:  Vitals:   03/06/24 0821 03/06/24 0831  BP: 105/69 102/72  Pulse:    Resp: 18 14  Temp:    SpO2: 100% 100%    Last Pain:  Vitals:   03/06/24 0831  TempSrc:   PainSc: 0-No pain                 VAN STAVEREN,Minta Fair      "

## 2024-03-06 NOTE — Anesthesia Preprocedure Evaluation (Addendum)
"                                    Anesthesia Evaluation  Patient identified by MRN, date of birth, ID band Patient awake    Reviewed: Allergy & Precautions, NPO status , Patient's Chart, lab work & pertinent test results  Airway Mallampati: I  TM Distance: >3 FB Neck ROM: full    Dental  (+) Teeth Intact   Pulmonary neg pulmonary ROS   Pulmonary exam normal        Cardiovascular Exercise Tolerance: Good negative cardio ROS Normal cardiovascular exam Rhythm:Regular Rate:Normal     Neuro/Psych negative neurological ROS  negative psych ROS   GI/Hepatic negative GI ROS, Neg liver ROS,,,  Endo/Other  negative endocrine ROS    Renal/GU negative Renal ROS  negative genitourinary   Musculoskeletal   Abdominal Normal abdominal exam  (+)   Peds negative pediatric ROS (+)  Hematology negative hematology ROS (+) Blood dyscrasia, anemia   Anesthesia Other Findings Past Medical History: No date: Acne 01/30/2023: Acute upper respiratory infection, unspecified No date: Anemia No date: Depression No date: Miscarriage  Past Surgical History: No date: BREAST ENHANCEMENT SURGERY No date: BREAST ENHANCEMENT SURGERY     Comment:  2014 2014: COSMETIC SURGERY     Comment:  Breast Augmentation No date: DILATION AND CURETTAGE OF UTERUS     Comment:  2018 12/07/2016: DILATION AND EVACUATION; N/A     Comment:  Procedure: DILATATION AND EVACUATION;  Surgeon: Leonce Garnette BIRCH, MD;  Location: [facility] ORS;  Service: Gynecology;              Laterality: N/A; 2009: EYE SURGERY     Comment:  Lasik No date: LASIK No date: LASIK     Comment:  2009 No date: TONSILLECTOMY     Comment:  1992 No date: TONSILLECTOMY AND ADENOIDECTOMY  BMI    Body Mass Index: 22.55 kg/m      Reproductive/Obstetrics negative OB ROS                              Anesthesia Physical Anesthesia Plan  ASA: 1  Anesthesia Plan: General   Post-op  Pain Management:    Induction: Intravenous  PONV Risk Score and Plan: Propofol  infusion and TIVA  Airway Management Planned: Natural Airway and Nasal Cannula  Additional Equipment:   Intra-op Plan:   Post-operative Plan:   Informed Consent: I have reviewed the patients History and Physical, chart, labs and discussed the procedure including the risks, benefits and alternatives for the proposed anesthesia with the patient or authorized representative who has indicated his/her understanding and acceptance.     Dental Advisory Given  Plan Discussed with: CRNA  Anesthesia Plan Comments:         Anesthesia Quick Evaluation  "

## 2024-03-06 NOTE — OR Nursing (Signed)
 Patient denied pregnancy

## 2024-03-06 NOTE — Op Note (Signed)
 [facility] Patient Name: Brittany Good Procedure Date: 03/06/2024 7:22 AM MRN: [id number] Account #: [id number] Date of Birth: [birth year] Admit Type: Outpatient Age: 37 Room: [facility] ENDO ROOM 2 Gender: Female Note Status: Finalized Instrument Name: Upper GI Scope [id number] Procedure:             Upper GI endoscopy Indications:           Suspected upper gastrointestinal bleeding in patient                         with unexplained iron  deficiency anemia Providers:             Clotilda Schaffer, MD Referring MD:          Ginger Patrick (Referring MD) Medicines:             Propofol  per Anesthesia Complications:         No immediate complications. Procedure:             Pre-Anesthesia Assessment:                        - Prior to the procedure, a History and Physical was                         performed, and patient medications and allergies were                         reviewed. The patient's tolerance of previous                         anesthesia was also reviewed. The risks and benefits                         of the procedure and the sedation options and risks                         were discussed with the patient. All questions were                         answered, and informed consent was obtained. Prior                         Anticoagulants: The patient has taken no anticoagulant                         or antiplatelet agents. ASA Grade Assessment: II - A                         patient with mild systemic disease. After reviewing                         the risks and benefits, the patient was deemed in                         satisfactory condition to undergo the procedure.                        After obtaining informed consent, the endoscope was  passed under direct vision. Throughout the procedure,                         the patient's blood pressure, pulse, and oxygen                         saturations were  monitored continuously. The Endoscope                         was introduced through the mouth, and advanced to the                         third part of duodenum. The upper GI endoscopy was                         accomplished without difficulty. The patient tolerated                         the procedure well. Findings:      A mild Schatzki ring was found at the gastroesophageal junction.      Mild mucosal changes characterized by longitudinal markings were found       in the middle third of the esophagus and in the lower third of the       esophagus. Biopsies were taken with a cold forceps for histology.       Estimated blood loss: none.      The entire examined stomach was normal. Biopsies were taken with a cold       forceps for histology. Estimated blood loss: none.      The examined duodenum was normal. Biopsies for histology were taken with       a cold forceps for evaluation of celiac disease. Estimated blood loss:       none. Impression:            - Mild Schatzki ring.                        - Longitudinally marked mucosa in the esophagus.                         Biopsied.                        - Normal stomach. Biopsied.                        - Normal examined duodenum. Biopsied. Recommendation:        - Patient has a contact number available for                         emergencies. The signs and symptoms of potential                         delayed complications were discussed with the patient.                         Return to normal activities tomorrow. Written                         discharge instructions were provided  to the patient.                        - High fiber diet.                        - Continue present medications.                        - Await pathology results.                        - The findings and recommendations were discussed with                         the designated responsible adult. Procedure Code(s):     --- Professional ---                         [phone], Esophagogastroduodenoscopy, flexible,                         transoral; with biopsy, single or multiple Diagnosis Code(s):     --- Professional ---                        K22.2, Esophageal obstruction                        K22.89, Other specified disease of esophagus                        D50.9, Iron  deficiency anemia, unspecified CPT copyright 2022 American Medical Association. All rights reserved. The codes documented in this report are preliminary and upon coder review may  be revised to meet current compliance requirements. Clotilda Schaffer, MD 03/06/2024 7:50:31 AM Number of Addenda: 0 Note Initiated On: 03/06/2024 7:22 AM Estimated Blood Loss:  Estimated blood loss: none.      [facility]

## 2024-03-07 LAB — SURGICAL PATHOLOGY

## 2024-03-09 ENCOUNTER — Ambulatory Visit: Payer: Self-pay | Admitting: Family

## 2024-03-09 NOTE — Progress Notes (Signed)
 noted

## 2024-03-10 ENCOUNTER — Other Ambulatory Visit
Admission: RE | Admit: 2024-03-10 | Discharge: 2024-03-10 | Disposition: A | Discharge status: Home / Self Care | Payer: Self-pay | Source: Ambulatory Visit | Attending: Medical Genetics | Admitting: Medical Genetics

## 2024-03-13 ENCOUNTER — Other Ambulatory Visit: Payer: Self-pay

## 2024-03-13 ENCOUNTER — Ambulatory Visit

## 2024-03-13 DIAGNOSIS — R293 Abnormal posture: Secondary | ICD-10-CM

## 2024-03-13 DIAGNOSIS — N3941 Urge incontinence: Secondary | ICD-10-CM | POA: Diagnosis not present

## 2024-03-13 DIAGNOSIS — M6281 Muscle weakness (generalized): Secondary | ICD-10-CM

## 2024-03-13 DIAGNOSIS — R2689 Other abnormalities of gait and mobility: Secondary | ICD-10-CM

## 2024-03-13 NOTE — Therapy (Signed)
 " OUTPATIENT PHYSICAL THERAPY FEMALE PELVIC TREATMENT   Patient Name: Brittany Good MRN: [id number] DOB:[birth year], 38 y.o., female Today's Date: 03/13/2024  END OF SESSION:  PT End of Session - 03/13/24 0936     Visit Number 6    Number of Visits 9    Date for Recertification  03/19/24    Authorization Type [employer]    Progress Note Due on Visit 10    PT Start Time 0932    PT Stop Time 1013    PT Time Calculation (min) 41 min    Activity Tolerance Patient tolerated treatment well    Behavior During Therapy [facility] for tasks assessed/performed          Past Medical History:  Diagnosis Date   Acne    Acute upper respiratory infection, unspecified 01/30/2023   Anemia    Depression    Miscarriage    Past Surgical History:  Procedure Laterality Date   BIOPSY OF SKIN SUBCUTANEOUS TISSUE AND/OR MUCOUS MEMBRANE  03/06/2024   Procedure: BIOPSY, GI;  Surgeon: Melany Clotilda HERO, MD;  Location: [facility] ENDOSCOPY;  Service: Endoscopy;;   BREAST ENHANCEMENT SURGERY     BREAST ENHANCEMENT SURGERY     2014   COLONOSCOPY N/A 03/06/2024   Procedure: COLONOSCOPY;  Surgeon: Melany Clotilda HERO, MD;  Location: [facility] ENDOSCOPY;  Service: Endoscopy;  Laterality: N/A;  DR SCOLLS   COSMETIC SURGERY  2014   Breast Augmentation   DILATION AND CURETTAGE OF UTERUS     2018   DILATION AND EVACUATION N/A 12/07/2016   Procedure: DILATATION AND EVACUATION;  Surgeon: Leonce Garnette BIRCH, MD;  Location: [facility] ORS;  Service: Gynecology;  Laterality: N/A;   ESOPHAGOGASTRODUODENOSCOPY N/A 03/06/2024   Procedure: EGD (ESOPHAGOGASTRODUODENOSCOPY);  Surgeon: Melany Clotilda HERO, MD;  Location: [facility] ENDOSCOPY;  Service: Endoscopy;  Laterality: N/A;   EYE SURGERY  2009   Lasik   LASIK     LASIK     2009   POLYPECTOMY  03/06/2024   Procedure: POLYPECTOMY, INTESTINE;  Surgeon: Melany Clotilda HERO, MD;  Location: [facility] ENDOSCOPY;  Service: Endoscopy;;   TONSILLECTOMY     1992   TONSILLECTOMY AND  ADENOIDECTOMY     Patient Active Problem List   Diagnosis Date Noted   Polyp of ascending colon 03/06/2024   Iron  deficiency 01/18/2024   Hair thinning 11/01/2023   Urge incontinence of urine 11/01/2023   MDD (major depressive disorder) 07/12/2022   PMDD (premenstrual dysphoric disorder) 07/12/2022   Social anxiety disorder 07/12/2022   Alcohol abuse, in remission 02/16/2022   Iron  deficiency anemia 05/11/2021   Chronic bilateral low back pain without sciatica 05/11/2021    PCP: Ginger Patrick FNP  REFERRING PROVIDER: Ginger Patrick FNP  REFERRING DIAG: UI  THERAPY DIAG:  Urge incontinence of urine  Muscle weakness (generalized)  Other abnormalities of gait and mobility  Abnormal posture  Rationale for Evaluation and Treatment: Rehabilitation  ONSET DATE: 11/01/23 referral date but leakage started approx. One year ago  SUBJECTIVE:  SUBJECTIVE STATEMENT: Pt reported she's on new ADHD medications. Pt had a endoscopy and colonoscopy last week and she's had a sore throat since that time but does not feel sick. She hasn't gotten results from testing yet. She has noticed leaking with (powerful) coughing, dribble. Pt reports approx. 50% improvement since starting PHPT (remaining 50% improvement is 2/2 leakage with powerful cough). Pt felt fine after L upper trap TDN, she still has tightness in L upper trap and relief lasted 2-3 days. She felt like exercises were a little easier after TDN. Pt reported she has not been as consistent with HEP 2/2 holidays.   EVAL: URINARY FUNCTION: pt voids very often and stream is strong, voids at least once every two hours. Pt reports SUI and urgency incontinence (more so than SUI). Pt denied pain with voiding. Pt hasn't been getting to void lately. Feels like she's fully  emptying bladder.  BOWEL FUNCTION: at least once a day, mostly types 3 and 4. Pt denied pain with BMs. No fiber supplements, stool softeners, laxatives. Can't always tell if she's fully emptying the rectum.  CORE STABILITY: pt denied abdominal surgeries, MVAs, or coccyx fractures. Pt has chronic LBP and it got better with working more but hasn't been working out as much and feels it creeping up again 2/2 fatigue.  SEXUAL FUNCTION: pt denied pain with intercourse, tampon insertion or OBGYN exam. No difficulty climaxing.   Fluid intake: constantly drinking something, starts with coffee, drinks water and coffee throughout the day, seltzer water drinks, no soda or juice, occasionally has hot tea. No alcohol-recovering alcoholic.   FUNCTIONAL LIMITATIONS: limits jumping or lifting 2/2 leakage and worries about leaking at work (used to be able to hold urine)  PERTINENT HISTORY:  Medications for current condition: tried Vesicare  for four days but got PNA and was very dried out Surgeries: not for this condition Other: Iron  deficiency anemia Chronic LBP, major depressive disorder, PMDD, anxiety, UI, getting lab work done-saw hematology yesterday and will have iron  infusions, hx of breast, breast augmentation  Sexual abuse: No  PAIN:  Are you having pain? No 03/13/24 NPRS scale: 0/10   Back pain: 0/10 at best, at worst: achy 1-2/10  PRECAUTIONS: None  RED FLAGS: None   WEIGHT BEARING RESTRICTIONS: No  FALLS:  Has patient fallen in last 6 months? 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  ACTIVITY LEVEL : was working out by very fatigued but she still works out three times a week (tread, manufacturing systems engineer, scientist, research (physical sciences)) and she was going 4-5 days/week  PLOF: Independent  PATIENT GOALS: to stop peeing on myself-it's uncomfortable and embarrassing    BOWEL MOVEMENT: Pain with bowel movement: No Type of bowel movement:Frequency once a day Fully empty rectum: No not sure Leakage:  No                                                  Caused by:  Bowel urgency: no Pads: No Fiber supplement/laxative No  URINATION: Pain with urination: No Fully empty bladder: Yes:                                           Post-void dribble: No Stream: Strong Urgency: Yes  Frequency:during  the day every two hours or so                                                        Nocturia: Nonot lately   Leakage: Urge to void, Walking to the bathroom, Coughing, Sneezing, Laughing, Exercise, and Lifting Pads/briefs: Yes: three per day, pantyliners or light poise pads  INTERCOURSE:  Ability to have vaginal penetration Yes  Pain with intercourse: none Dryness: No Climax: no issues Marinoff Scale: 0/3   PREGNANCY: Number of pregnancies: 4 Vaginal deliveries 3 (18, 14, 4)  Tearing Yes: she tore with all of them, first or second tears Episiotomy No C-section deliveries 0 Currently pregnant No  PROLAPSE: None   OBJECTIVE:  Note: Objective measures were completed at Evaluation unless otherwise noted.  COGNITION: Overall cognitive status: Within functional limits for tasks assessed     SENSATION: Light touch: Appears intact    FUNCTIONAL TESTS:   Single leg stance:  Rt: WNL  Lt: WNL Sit-up test: Squat: Bed mobility:  GAIT: Assistive device utilized: None Comments: decr. Trunk rot and fluidity  POSTURE: increased lumbar lordosis and anterior pelvic tilt   LUMBARAROM/PROM: all WNL with a little mild discomfort of L side LBP  A/PROM A/PROM  Eval (% available)  Flexion   Extension   Right lateral flexion   Left lateral flexion   Right rotation   Left rotation    (Blank rows = not tested)  LOWER EXTREMITY ROM: all WNL except for limited B hip IR  Active ROM Right eval Left eval  Hip flexion    Hip extension    Hip abduction    Hip adduction    Hip internal rotation    Hip external rotation    Knee flexion    Knee extension    Ankle dorsiflexion     Ankle plantarflexion    Ankle inversion    Ankle eversion     (Blank rows = not tested)  LOWER EXTREMITY MMT: general B hip abd/add in seated: 4  MMT Right eval Left eval  Hip flexion 4+ 4+  Hip extension    Hip abduction 3+ 3+  Hip adduction 3+ 3+  Hip internal rotation 4- 4-  Hip external rotation 3+ 3+  Knee flexion 4+ 4+  Knee extension 5 5  Ankle dorsiflexion 5 5  Ankle plantarflexion    Ankle inversion    Ankle eversion     (Blank rows = not tested) PALPATION:  General: no TTP during palpation of cx-lx spine, hypomobility of tx spine noted, DR: two finger's width at umbilicus and inf. To umbilicus, about 1-1.5 finger's width sup. To umbilicus. Decr. Post. Rib expansion during inhalation. Glutes, hip add., and rectus abdominus activation noted during external, gloved palpation of PFM contraction/relaxation.  Pelvic Alignment: APT   PELVIC MMT:   MMT eval  Vaginal   Internal Anal Sphincter   External Anal Sphincter   Puborectalis   (Blank rows = not tested)        TONE: Incr. Tension in paraspinals  PROLAPSE: none   TODAY'S TREATMENT:  DATE: 03/13/24     THEREX:  Access Code: [email or web address] URL: [email or web address] Date: 03/13/2024 Prepared by: Delon Pinal  Exercises - Heron Duran from [employer]  - 1 x daily - 3 x weekly - 1 sets - 3 reps - 30 hold - Side Plank on Knees  - 1 x daily - 3 x weekly - 1 sets - 3 reps - 30 hold - Kettlebell Deadlift  - 1 x daily - 3 x weekly - 3 sets - 10 reps -Seated PFM contraction and relaxation incr. Time from 5 sec. To 10 sec. X5 reps and then x10 reps quick holds 1-2 daily. Cues and demo for proper technique. S for safety. No pain reported.   SELF CARE:  PATIENT EDUCATION:  Education details: PT educated pt on progressing HEP and potential for TDN of L upper trap again as  needed. PT discussed pt checking with MD re: hormones as she had questions about perimenopause. Person educated: Patient Education method: Explanation, Demonstration, and Handouts Education comprehension: verbalized understanding and needs further education  HOME EXERCISE PROGRAM: [email or web address]  ASSESSMENT:  CLINICAL IMPRESSION: Skilled session focused on progressing strength training (LEs, hips and core) to decr. Tension on PFM. Pt demonstrated progress as she reported 50% improvement since starting PHPT, still leaks with powerful coughing. Pt able to progress PFM contraction. Pt The following impairments were noted upon exam: limited ROM, back pain, postural dysfunction, decr. Strength likely, and urgency and stress UI. Pt would continue to benefit from skilled PT to improve safety and decr. Pain during all ADLs. LTGs held a week as pt missed appt 2/2 PT out sick.   OBJECTIVE IMPAIRMENTS: Abnormal gait, decreased coordination, decreased endurance, decreased strength, hypomobility, increased fascial restrictions, postural dysfunction, and pain.   ACTIVITY LIMITATIONS: carrying, lifting, bending, sitting, standing, squatting, transfers, continence, locomotion level, and caring for others  PARTICIPATION LIMITATIONS: meal prep, cleaning, laundry, interpersonal relationship, community activity, and occupation  PERSONAL FACTORS: 3+ comorbidities: see above and pt currently undergoing GI testing are also affecting patient's functional outcome.   REHAB POTENTIAL: Good  CLINICAL DECISION MAKING: Evolving/moderate complexity  EVALUATION COMPLEXITY: Moderate currently undergoing GI testing   GOALS: Goals reviewed with patient? Yes  SHORT TERM GOALS: Target date: for all STGs: 02/16/24  Pt will be IND in HEP to improve pain, strength, coordination. Baseline: no HEP but goes to gym 3x/week but used to go 4-5x/week Goal status: MET  2.  Finish exam and write goals as indicated. Baseline: limited  by time constraints Goal status: MET  3.  Pt will demo proper toileting posture to fully empty bladder and reduce straining during bowel movement. Baseline: unable to demo 12/1: able to demo  Goal status: MET   4.  Pt will demonstrated improved relaxation and contraction of PFM with coordination of breath to reduce urinary leakage to </=three/week. Baseline: wears three pads a day 2/2 UI; 12/1: no leakage this last week, only wearing a pantyliner  Goal status: MET   LONG TERM GOALS: Target date: for all LTGs: 03/15/24  Pt will report going back to the gym 4-5x/week to maximize gains made in PT. Baseline: three times a week-decr. 2/2 fatigue and UI limits what she does Goal status: INITIAL  2.  Pt will demonstrated improved relaxation and contraction of PFM with coordination of breath to reduce urinary leakage to </=once/week. Baseline:  three pads a day Goal status: INITIAL  PLAN: check LTGs. assess L upper trap and TDN prn, assess B scaps and thoracolumbar junction  PT FREQUENCY: 1x/week  PT DURATION: 8 weeks  PLANNED INTERVENTIONS: 97110-Therapeutic exercises, 97530- Therapeutic activity, [id number]- Neuromuscular re-education, 97535- Self Care, [id number]- Manual therapy, and Patient/Family education, Dry needling   Dayshia Ballinas L, PT 03/13/2024, 9:37 AM  Delon Pinal, PT,DPT 03/13/2024 9:37 AM Phone: [phone] Fax: [phone]  "

## 2024-03-19 ENCOUNTER — Other Ambulatory Visit: Payer: Self-pay

## 2024-03-19 ENCOUNTER — Encounter: Payer: Self-pay | Admitting: Internal Medicine

## 2024-03-19 MED ORDER — LISDEXAMFETAMINE DIMESYLATE 40 MG PO CAPS
40.0000 mg | ORAL_CAPSULE | Freq: Every morning | ORAL | 0 refills | Status: AC
  Filled 2024-03-19: qty 30, 30d supply, fill #0

## 2024-03-19 MED ORDER — FLUOXETINE HCL 20 MG PO CAPS
20.0000 mg | ORAL_CAPSULE | Freq: Every day | ORAL | 0 refills | Status: AC
  Filled 2024-03-20: qty 90, 90d supply, fill #0

## 2024-03-20 ENCOUNTER — Other Ambulatory Visit (HOSPITAL_COMMUNITY): Payer: Self-pay

## 2024-03-22 ENCOUNTER — Other Ambulatory Visit: Payer: Self-pay

## 2024-03-22 MED ORDER — AMPHETAMINE-DEXTROAMPHETAMINE 10 MG PO TABS
10.0000 mg | ORAL_TABLET | Freq: Every day | ORAL | 0 refills | Status: DC | PRN
  Filled 2024-03-22: qty 30, 30d supply, fill #0

## 2024-03-23 LAB — GENECONNECT MOLECULAR SCREEN: Genetic Analysis Overall Interpretation: NEGATIVE

## 2024-03-26 ENCOUNTER — Encounter: Payer: Self-pay | Admitting: Internal Medicine

## 2024-03-29 ENCOUNTER — Other Ambulatory Visit: Payer: Self-pay

## 2024-03-29 ENCOUNTER — Encounter: Payer: Self-pay | Admitting: Internal Medicine

## 2024-03-29 MED ORDER — LISDEXAMFETAMINE DIMESYLATE 50 MG PO CAPS
50.0000 mg | ORAL_CAPSULE | Freq: Every morning | ORAL | 0 refills | Status: AC
  Filled 2024-03-29: qty 30, 30d supply, fill #0

## 2024-03-30 ENCOUNTER — Other Ambulatory Visit: Payer: Self-pay

## 2024-04-02 ENCOUNTER — Other Ambulatory Visit: Payer: Self-pay

## 2024-04-02 ENCOUNTER — Ambulatory Visit: Attending: Family

## 2024-04-02 DIAGNOSIS — N3941 Urge incontinence: Secondary | ICD-10-CM | POA: Insufficient documentation

## 2024-04-02 DIAGNOSIS — R293 Abnormal posture: Secondary | ICD-10-CM | POA: Diagnosis present

## 2024-04-02 DIAGNOSIS — M6281 Muscle weakness (generalized): Secondary | ICD-10-CM | POA: Diagnosis present

## 2024-04-02 DIAGNOSIS — R2689 Other abnormalities of gait and mobility: Secondary | ICD-10-CM | POA: Diagnosis present

## 2024-04-02 NOTE — Patient Instructions (Signed)
 SIJ: roll up a wash cloth, place it parallel to LEFT sacrum and lie on it for 5 minutes, daily for one week. The second week: perform the same thing every other day.

## 2024-04-02 NOTE — Therapy (Signed)
 " OUTPATIENT PHYSICAL THERAPY FEMALE PELVIC TREATMENT   Patient Name: Brittany Good MRN: [id number] DOB:[birth year], 39 y.o., female Today's Date: 04/02/2024  END OF SESSION:  PT End of Session - 04/02/24 0933     Visit Number 7    Number of Visits 9    Date for Recertification  03/19/24    Authorization Type [employer]    Progress Note Due on Visit 10    PT Start Time 0931    PT Stop Time 1011    PT Time Calculation (min) 40 min    Activity Tolerance Patient tolerated treatment well    Behavior During Therapy [facility] for tasks assessed/performed          Past Medical History:  Diagnosis Date   Acne    Acute upper respiratory infection, unspecified 01/30/2023   Anemia    Depression    Miscarriage    Past Surgical History:  Procedure Laterality Date   BIOPSY OF SKIN SUBCUTANEOUS TISSUE AND/OR MUCOUS MEMBRANE  03/06/2024   Procedure: BIOPSY, GI;  Surgeon: Melany Clotilda HERO, MD;  Location: [facility] ENDOSCOPY;  Service: Endoscopy;;   BREAST ENHANCEMENT SURGERY     BREAST ENHANCEMENT SURGERY     2014   COLONOSCOPY N/A 03/06/2024   Procedure: COLONOSCOPY;  Surgeon: Melany Clotilda HERO, MD;  Location: [facility] ENDOSCOPY;  Service: Endoscopy;  Laterality: N/A;  DR SCOLLS   COSMETIC SURGERY  2014   Breast Augmentation   DILATION AND CURETTAGE OF UTERUS     2018   DILATION AND EVACUATION N/A 12/07/2016   Procedure: DILATATION AND EVACUATION;  Surgeon: Leonce Garnette BIRCH, MD;  Location: [facility] ORS;  Service: Gynecology;  Laterality: N/A;   ESOPHAGOGASTRODUODENOSCOPY N/A 03/06/2024   Procedure: EGD (ESOPHAGOGASTRODUODENOSCOPY);  Surgeon: Melany Clotilda HERO, MD;  Location: [facility] ENDOSCOPY;  Service: Endoscopy;  Laterality: N/A;   EYE SURGERY  2009   Lasik   LASIK     LASIK     2009   POLYPECTOMY  03/06/2024   Procedure: POLYPECTOMY, INTESTINE;  Surgeon: Melany Clotilda HERO, MD;  Location: [facility] ENDOSCOPY;  Service: Endoscopy;;   TONSILLECTOMY     1992   TONSILLECTOMY AND  ADENOIDECTOMY     Patient Active Problem List   Diagnosis Date Noted   Polyp of ascending colon 03/06/2024   Iron  deficiency 01/18/2024   Hair thinning 11/01/2023   Urge incontinence of urine 11/01/2023   MDD (major depressive disorder) 07/12/2022   PMDD (premenstrual dysphoric disorder) 07/12/2022   Social anxiety disorder 07/12/2022   Alcohol abuse, in remission 02/16/2022   Iron  deficiency anemia 05/11/2021   Chronic bilateral low back pain without sciatica 05/11/2021    PCP: Ginger Patrick FNP  REFERRING PROVIDER: Ginger Patrick FNP  REFERRING DIAG: UI  THERAPY DIAG:  Urge incontinence of urine  Muscle weakness (generalized)  Other abnormalities of gait and mobility  Abnormal posture  Rationale for Evaluation and Treatment: Rehabilitation  ONSET DATE: 11/01/23 referral date but leakage started approx. One year ago  SUBJECTIVE:  SUBJECTIVE STATEMENT: Pt reported she hasn't been new HEP but doing a variation of the exercises: she's been going to the gym with her dtr, she's doing the planks and dead bugs and bird dog rows. Her Good upper trap has been not as painful and tense but still a bit overactive. Pt had a little leakage, approx. 2-3x/week, more of a dribble vs. [place]. As coughing has improved so has leakage. Pt feels like she's voiding often because she drinks a lot of water but does go just in case. Back pain has been ok, she had Good LBP last week.  EVAL: URINARY FUNCTION: pt voids very often and stream is strong, voids at least once every two hours. Pt reports SUI and urgency incontinence (more so than SUI). Pt denied pain with voiding. Pt hasn't been getting to void lately. Feels like she's fully emptying bladder.  BOWEL FUNCTION: at least once a day, mostly types 3 and 4. Pt denied pain  with BMs. No fiber supplements, stool softeners, laxatives. Can't always tell if she's fully emptying the rectum.  CORE STABILITY: pt denied abdominal surgeries, MVAs, or coccyx fractures. Pt has chronic LBP and it got better with working more but hasn't been working out as much and feels it creeping up again 2/2 fatigue.  SEXUAL FUNCTION: pt denied pain with intercourse, tampon insertion or OBGYN exam. No difficulty climaxing.   Fluid intake: constantly drinking something, starts with coffee, drinks water and coffee throughout the day, seltzer water drinks, no soda or juice, occasionally has hot tea. No alcohol-recovering alcoholic.   FUNCTIONAL LIMITATIONS: limits jumping or lifting 2/2 leakage and worries about leaking at work (used to be able to hold urine)  PERTINENT HISTORY:  Medications for current condition: tried Vesicare  for four days but got PNA and was very dried out Surgeries: not for this condition Other: Iron  deficiency anemia Chronic LBP, major depressive disorder, PMDD, anxiety, UI, getting lab work done-saw hematology yesterday and will have iron  infusions, hx of breast, breast augmentation  Sexual abuse: No  PAIN:  Are you having pain? No 04/02/24 NPRS scale: 0/10   Back pain: 0/10 at best, at worst: achy 1-2/10  PRECAUTIONS: None  RED FLAGS: None   WEIGHT BEARING RESTRICTIONS: No  FALLS:  Has patient fallen in last 6 months? 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  ACTIVITY LEVEL : was working out by very fatigued but she still works out three times a week (tread, manufacturing systems engineer, scientist, research (physical sciences)) and she was going 4-5 days/week  PLOF: Independent  PATIENT GOALS: to stop peeing on myself-it's uncomfortable and embarrassing    BOWEL MOVEMENT: Pain with bowel movement: No Type of bowel movement:Frequency once a day Fully empty rectum: No not sure Leakage: No                                                  Caused by:  Bowel urgency: no Pads:  No Fiber supplement/laxative No  URINATION: Pain with urination: No Fully empty bladder: Yes:                                           Post-void dribble: No Stream: Strong Urgency: Yes  Frequency:during the day every two hours  or so                                                        Nocturia: Nonot lately   Leakage: Urge to void, Walking to the bathroom, Coughing, Sneezing, Laughing, Exercise, and Lifting Pads/briefs: Yes: three per day, pantyliners or light poise pads  INTERCOURSE:  Ability to have vaginal penetration Yes  Pain with intercourse: none Dryness: No Climax: no issues Marinoff Scale: 0/3   PREGNANCY: Number of pregnancies: 4 Vaginal deliveries 3 (18, 14, 4)  Tearing Yes: she tore with all of them, first or second tears Episiotomy No C-section deliveries 0 Currently pregnant No  PROLAPSE: None   OBJECTIVE:  Note: Objective measures were completed at Evaluation unless otherwise noted.  COGNITION: Overall cognitive status: Within functional limits for tasks assessed     SENSATION: Light touch: Appears intact    FUNCTIONAL TESTS:   Single leg stance:  Rt: WNL  Lt: WNL Sit-up test: Squat: Bed mobility:  GAIT: Assistive device utilized: None Comments: decr. Trunk rot and fluidity  POSTURE: increased lumbar lordosis and anterior pelvic tilt   LUMBARAROM/PROM: all WNL with a little mild discomfort of Good side LBP  A/PROM A/PROM  Eval (% available)  Flexion   Extension   Right lateral flexion   Left lateral flexion   Right rotation   Left rotation    (Blank rows = not tested)  LOWER EXTREMITY ROM: all WNL except for limited B hip IR  Active ROM Right eval Left eval  Hip flexion    Hip extension    Hip abduction    Hip adduction    Hip internal rotation    Hip external rotation    Knee flexion    Knee extension    Ankle dorsiflexion    Ankle plantarflexion    Ankle inversion    Ankle eversion     (Blank rows = not  tested)  LOWER EXTREMITY MMT: general B hip abd/add in seated: 4  MMT Right eval Left eval  Hip flexion 4+ 4+  Hip extension    Hip abduction 3+ 3+  Hip adduction 3+ 3+  Hip internal rotation 4- 4-  Hip external rotation 3+ 3+  Knee flexion 4+ 4+  Knee extension 5 5  Ankle dorsiflexion 5 5  Ankle plantarflexion    Ankle inversion    Ankle eversion     (Blank rows = not tested) PALPATION:  General: no TTP during palpation of cx-lx spine, hypomobility of tx spine noted, DR: two finger's width at umbilicus and inf. To umbilicus, about 1-1.5 finger's width sup. To umbilicus. Decr. Post. Rib expansion during inhalation. Glutes, hip add., and rectus abdominus activation noted during external, gloved palpation of PFM contraction/relaxation.  Pelvic Alignment: APT   PELVIC MMT:   MMT eval  Vaginal   Internal Anal Sphincter   External Anal Sphincter   Puborectalis   (Blank rows = not tested)        TONE: Incr. Tension in paraspinals  PROLAPSE: none   TODAY'S TREATMENT:  DATE: 04/02/24    MANUAL THERAPY:   PT performed STM to Good levator scap and R rhomboid/lower trap area and gentle cupping to Good thoracolumbar junction to decr. Tension. Then PT  had pt perform: SIJ: roll up a wash cloth, place it parallel to Right sacrum and lie on it for 5 minutes, daily for one week. The second week: perform the same thing every other day to decr. Good sacral hypomobility and tension. Pt amb. 41' around room after manual therapy with no pain and felt more relaxed.   SELF CARE:  PATIENT EDUCATION:  Education details: PT educated pt on goal progress and updated POC and how to perform SIJ washcloth exercise: SIJ: roll up a wash cloth, place it parallel to Right sacrum and lie on it for 5 minutes, daily for one week. The second week: perform the same thing every other  day. Person educated: Patient Education method: Explanation, Demonstration, and Handouts Education comprehension: verbalized understanding and needs further education  HOME EXERCISE PROGRAM: [id number]  ASSESSMENT:  CLINICAL IMPRESSION: Skilled session focused on assessing LTGs, both partially met 2/2 illness causing incr. Coughing and leakage. Pt demonstrated progress as she's able to go to the gym more often with less UI and pain. Pt continues to experience SUI and incr. Urinary frequency and back pain. Pt The following impairments were noted upon exam: limited ROM, back pain, postural dysfunction, decr. Strength likely, and urgency and stress UI. Pt would continue to benefit from skilled PT to improve safety and decr. Pain during all ADLs. PT requesting add'Good visits to meet on-going goals.  OBJECTIVE IMPAIRMENTS: Abnormal gait, decreased coordination, decreased endurance, decreased strength, hypomobility, increased fascial restrictions, postural dysfunction, and pain.   ACTIVITY LIMITATIONS: carrying, lifting, bending, sitting, standing, squatting, transfers, continence, locomotion level, and caring for others  PARTICIPATION LIMITATIONS: meal prep, cleaning, laundry, interpersonal relationship, community activity, and occupation  PERSONAL FACTORS: 3+ comorbidities: see above and pt currently undergoing GI testing are also affecting patient's functional outcome.   REHAB POTENTIAL: Good  CLINICAL DECISION MAKING: Evolving/moderate complexity  EVALUATION COMPLEXITY: Moderate currently undergoing GI testing   GOALS: Goals reviewed with patient? Yes  SHORT TERM GOALS: Target date: for all STGs: 02/16/24  Pt will be IND in HEP to improve pain, strength, coordination. Baseline: no HEP but goes to gym 3x/week but used to go 4-5x/week Goal status: MET  2.  Finish exam and write goals as indicated. Baseline: limited by time constraints Goal status: MET  3.  Pt will demo proper  toileting posture to fully empty bladder and reduce straining during bowel movement. Baseline: unable to demo 12/1: able to demo  Goal status: MET   4.  Pt will demonstrated improved relaxation and contraction of PFM with coordination of breath to reduce urinary leakage to </=three/week. Baseline: wears three pads a day 2/2 UI; 12/1: no leakage this last week, only wearing a pantyliner  Goal status: MET   LONG TERM GOALS: Target date: for all LTGs: 03/15/24. New target date for all LTGs: 04/30/24  Pt will report going back to the gym 4-5x/week to maximize gains made in PT. Baseline: three times a week-decr. 2/2 fatigue and UI limits what she does 1/19: she feels like that is better and she's picking back up, she gets her steps in-, 3-5 days/week depending on schedule. Goal status: PARTIALLY MET  2.  Pt will demonstrated improved relaxation and contraction of PFM with coordination of breath to reduce urinary leakage to </=once/week. Baseline:  three pads  a day; 1/19: she wears pantyliners now, about 3 a day and it was improved but then coughing incr. it Goal status: PARTIALLY MET   PLAN:continue to Good upper trap and TDN prn, assess B scaps and thoracolumbar junction   PT FREQUENCY: 1x/week  PT DURATION: 8 weeks  PLANNED INTERVENTIONS: 97110-Therapeutic exercises, 97530- Therapeutic activity, [id number]- Neuromuscular re-education, 97535- Self Care, [id number]- Manual therapy, and Patient/Family education, Dry needling   Brittany Good, PT 04/02/2024, 9:34 AM  Brittany Good, PT,DPT 04/02/24 9:34 AM Phone: [phone] Fax: [phone]  "

## 2024-04-03 ENCOUNTER — Other Ambulatory Visit: Payer: Self-pay

## 2024-04-03 MED ORDER — ESTRADIOL 0.1 MG/24HR TD PTTW
MEDICATED_PATCH | TRANSDERMAL | 4 refills | Status: AC
  Filled 2024-04-03 – 2024-04-04 (×2): qty 24, 84d supply, fill #0

## 2024-04-03 MED ORDER — PROGESTERONE 200 MG PO CAPS
200.0000 mg | ORAL_CAPSULE | Freq: Every day | ORAL | 0 refills | Status: AC
  Filled 2024-04-03 – 2024-04-04 (×2): qty 30, 30d supply, fill #0

## 2024-04-04 ENCOUNTER — Other Ambulatory Visit: Payer: Self-pay

## 2024-04-04 ENCOUNTER — Encounter: Payer: Self-pay | Admitting: Internal Medicine

## 2024-04-04 ENCOUNTER — Other Ambulatory Visit (HOSPITAL_COMMUNITY): Payer: Self-pay

## 2024-04-10 ENCOUNTER — Encounter: Payer: Self-pay | Admitting: Internal Medicine

## 2024-04-10 ENCOUNTER — Encounter: Payer: Self-pay | Admitting: Family Medicine

## 2024-04-10 ENCOUNTER — Encounter: Payer: Self-pay | Admitting: Family

## 2024-04-10 DIAGNOSIS — R7303 Prediabetes: Secondary | ICD-10-CM

## 2024-04-12 ENCOUNTER — Telehealth: Payer: Self-pay | Admitting: Internal Medicine

## 2024-04-12 NOTE — Telephone Encounter (Signed)
 Patient called and lvm and asked for her labs that we had scheduled on Monday to be cancelled and asked for a requisttion to be filled out for her to get her labs done at [facility]. I called pt back and told her I would cancel labs and let team know

## 2024-04-16 ENCOUNTER — Ambulatory Visit

## 2024-04-16 ENCOUNTER — Inpatient Hospital Stay: Payer: Self-pay

## 2024-04-17 ENCOUNTER — Other Ambulatory Visit: Payer: Self-pay

## 2024-04-17 ENCOUNTER — Other Ambulatory Visit (HOSPITAL_COMMUNITY): Payer: Self-pay

## 2024-04-17 MED ORDER — PROGESTERONE MICRONIZED 100 MG PO CAPS
100.0000 mg | ORAL_CAPSULE | Freq: Every day | ORAL | 4 refills | Status: AC
  Filled 2024-04-17 (×2): qty 90, 90d supply, fill #0

## 2024-04-17 MED ORDER — AMPHETAMINE-DEXTROAMPHETAMINE 10 MG PO TABS
10.0000 mg | ORAL_TABLET | Freq: Every day | ORAL | 0 refills | Status: AC | PRN
  Filled 2024-04-18: qty 30, 30d supply, fill #0

## 2024-04-17 MED ORDER — LISDEXAMFETAMINE DIMESYLATE 60 MG PO CAPS
60.0000 mg | ORAL_CAPSULE | Freq: Every morning | ORAL | 0 refills | Status: AC
  Filled 2024-04-17: qty 30, 30d supply, fill #0

## 2024-04-18 ENCOUNTER — Other Ambulatory Visit: Payer: Self-pay

## 2024-04-19 ENCOUNTER — Other Ambulatory Visit: Payer: Self-pay

## 2024-04-19 ENCOUNTER — Ambulatory Visit

## 2024-04-19 ENCOUNTER — Encounter: Payer: Self-pay | Admitting: Internal Medicine

## 2024-04-19 DIAGNOSIS — R2689 Other abnormalities of gait and mobility: Secondary | ICD-10-CM

## 2024-04-19 DIAGNOSIS — R293 Abnormal posture: Secondary | ICD-10-CM

## 2024-04-19 DIAGNOSIS — M6281 Muscle weakness (generalized): Secondary | ICD-10-CM

## 2024-04-19 DIAGNOSIS — N3941 Urge incontinence: Secondary | ICD-10-CM

## 2024-04-19 LAB — HEMOGLOBIN A1C
Est. average glucose Bld gHb Est-mCnc: 105 mg/dL
Hgb A1c MFr Bld: 5.3 % (ref 4.8–5.6)

## 2024-04-19 NOTE — Therapy (Signed)
 " OUTPATIENT PHYSICAL THERAPY FEMALE PELVIC TREATMENT   Patient Name: Brittany Good MRN: [id number] DOB:01-16-1986, 39 y.o., female Today's Date: 04/19/2024  END OF SESSION:  PT End of Session - 04/19/24 1456     Visit Number 8    Number of Visits 9    Date for Recertification  03/19/24    Authorization Type [employer]    Progress Note Due on Visit 10    PT Start Time 1453    PT Stop Time 1531    PT Time Calculation (min) 38 min    Activity Tolerance Patient tolerated treatment well    Behavior During Therapy [facility] for tasks assessed/performed          Past Medical History:  Diagnosis Date   Acne    Acute upper respiratory infection, unspecified 01/30/2023   Anemia    Depression    Miscarriage    Past Surgical History:  Procedure Laterality Date   BIOPSY OF SKIN SUBCUTANEOUS TISSUE AND/OR MUCOUS MEMBRANE  03/06/2024   Procedure: BIOPSY, GI;  Surgeon: Melany Clotilda HERO, MD;  Location: [facility] ENDOSCOPY;  Service: Endoscopy;;   BREAST ENHANCEMENT SURGERY     BREAST ENHANCEMENT SURGERY     2014   COLONOSCOPY N/A 03/06/2024   Procedure: COLONOSCOPY;  Surgeon: Melany Clotilda HERO, MD;  Location: [facility] ENDOSCOPY;  Service: Endoscopy;  Laterality: N/A;  DR SCOLLS   COSMETIC SURGERY  2014   Breast Augmentation   DILATION AND CURETTAGE OF UTERUS     2018   DILATION AND EVACUATION N/A 12/07/2016   Procedure: DILATATION AND EVACUATION;  Surgeon: Leonce Garnette BIRCH, MD;  Location: [facility] ORS;  Service: Gynecology;  Laterality: N/A;   ESOPHAGOGASTRODUODENOSCOPY N/A 03/06/2024   Procedure: EGD (ESOPHAGOGASTRODUODENOSCOPY);  Surgeon: Melany Clotilda HERO, MD;  Location: [facility] ENDOSCOPY;  Service: Endoscopy;  Laterality: N/A;   EYE SURGERY  2009   Lasik   LASIK     LASIK     2009   POLYPECTOMY  03/06/2024   Procedure: POLYPECTOMY, INTESTINE;  Surgeon: Melany Clotilda HERO, MD;  Location: [facility] ENDOSCOPY;  Service: Endoscopy;;   TONSILLECTOMY     1992   TONSILLECTOMY AND  ADENOIDECTOMY     Patient Active Problem List   Diagnosis Date Noted   Polyp of ascending colon 03/06/2024   Iron  deficiency 01/18/2024   Hair thinning 11/01/2023   Urge incontinence of urine 11/01/2023   MDD (major depressive disorder) 07/12/2022   PMDD (premenstrual dysphoric disorder) 07/12/2022   Social anxiety disorder 07/12/2022   Alcohol abuse, in remission 02/16/2022   Iron  deficiency anemia 05/11/2021   Chronic bilateral low back pain without sciatica 05/11/2021    PCP: Ginger Patrick FNP  REFERRING PROVIDER: Ginger Patrick FNP  REFERRING DIAG: UI  THERAPY DIAG:  Urge incontinence of urine  Muscle weakness (generalized)  Other abnormalities of gait and mobility  Abnormal posture  Rationale for Evaluation and Treatment: Rehabilitation  ONSET DATE: 11/01/23 referral date but leakage started approx. One year ago  SUBJECTIVE:  SUBJECTIVE STATEMENT: Pt reported she went to the gym this morning. Pt is feeling good overall. She feels like leaking hasn't been an issue the last two weeks. Pt felt pain in L shoulder jt but it improved, did not feel muscular in nature.    EVAL: URINARY FUNCTION: pt voids very often and stream is strong, voids at least once every two hours. Pt reports SUI and urgency incontinence (more so than SUI). Pt denied pain with voiding. Pt hasn't been getting to void lately. Feels like she's fully emptying bladder.  BOWEL FUNCTION: at least once a day, mostly types 3 and 4. Pt denied pain with BMs. No fiber supplements, stool softeners, laxatives. Can't always tell if she's fully emptying the rectum.  CORE STABILITY: pt denied abdominal surgeries, MVAs, or coccyx fractures. Pt has chronic LBP and it got better with working more but hasn't been working out as much and feels  it creeping up again 2/2 fatigue.  SEXUAL FUNCTION: pt denied pain with intercourse, tampon insertion or OBGYN exam. No difficulty climaxing.   Fluid intake: constantly drinking something, starts with coffee, drinks water and coffee throughout the day, seltzer water drinks, no soda or juice, occasionally has hot tea. No alcohol-recovering alcoholic.   FUNCTIONAL LIMITATIONS: limits jumping or lifting 2/2 leakage and worries about leaking at work (used to be able to hold urine)  PERTINENT HISTORY:  Medications for current condition: tried Vesicare  for four days but got PNA and was very dried out Surgeries: not for this condition Other: Iron  deficiency anemia Chronic LBP, major depressive disorder, PMDD, anxiety, UI, getting lab work done-saw hematology yesterday and will have iron  infusions, hx of breast, breast augmentation  Sexual abuse: No  PAIN:  Are you having pain? No 04/19/24 NPRS scale: 0/10   Back pain: 0/10 at best, at worst: achy 1-2/10  PRECAUTIONS: None  RED FLAGS: None   WEIGHT BEARING RESTRICTIONS: No  FALLS:  Has patient fallen in last 6 months? 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  ACTIVITY LEVEL : was working out by very fatigued but she still works out three times a week (tread, manufacturing systems engineer, scientist, research (physical sciences)) and she was going 4-5 days/week  PLOF: Independent  PATIENT GOALS: to stop peeing on myself-it's uncomfortable and embarrassing    BOWEL MOVEMENT: Pain with bowel movement: No Type of bowel movement:Frequency once a day Fully empty rectum: No not sure Leakage: No                                                  Caused by:  Bowel urgency: no Pads: No Fiber supplement/laxative No  URINATION: Pain with urination: No Fully empty bladder: Yes:                                           Post-void dribble: No Stream: Strong Urgency: Yes  Frequency:during the day every two hours or so  Nocturia: Nonot lately   Leakage: Urge to void, Walking to the bathroom, Coughing, Sneezing, Laughing, Exercise, and Lifting Pads/briefs: Yes: three per day, pantyliners or light poise pads  INTERCOURSE:  Ability to have vaginal penetration Yes  Pain with intercourse: none Dryness: No Climax: no issues Marinoff Scale: 0/3   PREGNANCY: Number of pregnancies: 4 Vaginal deliveries 3 (18, 14, 4)  Tearing Yes: she tore with all of them, first or second tears Episiotomy No C-section deliveries 0 Currently pregnant No  PROLAPSE: None   OBJECTIVE:  Note: Objective measures were completed at Evaluation unless otherwise noted.  COGNITION: Overall cognitive status: Within functional limits for tasks assessed     SENSATION: Light touch: Appears intact    FUNCTIONAL TESTS:   Single leg stance:  Rt: WNL  Lt: WNL Sit-up test: Squat: Bed mobility:  GAIT: Assistive device utilized: None Comments: decr. Trunk rot and fluidity  POSTURE: increased lumbar lordosis and anterior pelvic tilt   LUMBARAROM/PROM: all WNL with a little mild discomfort of L side LBP  A/PROM A/PROM  Eval (% available)  Flexion   Extension   Right lateral flexion   Left lateral flexion   Right rotation   Left rotation    (Blank rows = not tested)  LOWER EXTREMITY ROM: all WNL except for limited B hip IR  Active ROM Right eval Left eval  Hip flexion    Hip extension    Hip abduction    Hip adduction    Hip internal rotation    Hip external rotation    Knee flexion    Knee extension    Ankle dorsiflexion    Ankle plantarflexion    Ankle inversion    Ankle eversion     (Blank rows = not tested)  LOWER EXTREMITY MMT: general B hip abd/add in seated: 4  MMT Right eval Left eval  Hip flexion 4+ 4+  Hip extension    Hip abduction 3+ 3+  Hip adduction 3+ 3+  Hip internal rotation 4- 4-  Hip external rotation 3+ 3+  Knee flexion 4+ 4+  Knee extension 5 5  Ankle dorsiflexion  5 5  Ankle plantarflexion    Ankle inversion    Ankle eversion     (Blank rows = not tested) PALPATION:  General: no TTP during palpation of cx-lx spine, hypomobility of tx spine noted, DR: two finger's width at umbilicus and inf. To umbilicus, about 1-1.5 finger's width sup. To umbilicus. Decr. Post. Rib expansion during inhalation. Glutes, hip add., and rectus abdominus activation noted during external, gloved palpation of PFM contraction/relaxation.  Pelvic Alignment: APT   PELVIC MMT:   MMT eval  Vaginal   Internal Anal Sphincter   External Anal Sphincter   Puborectalis   (Blank rows = not tested)        TONE: Incr. Tension in paraspinals  PROLAPSE: none   TODAY'S TREATMENT:  DATE: 04/19/24    THEREX:  Access Code: [email or web address] URL: [email or web address] Date: 04/19/2024 Prepared by: Delon Pinal  Exercises) -side plank on knees, 30 sec. Hold/side (stopped as we progressed) - Side Plank on Elbow with Rotation  - 1 x daily - 3 x weekly - 3 sets - 10 reps , (static hold, rot, and hip dips) - Sidelying Short Adductor Forearm Plank  - 1 x daily - 3 x weekly - 1 sets - 5-10 reps  (modified) - Half Kneeling Anti-Rotation Press With Shoulder Flexion and Anchored Resistance  - 1 x daily - 3 x weekly - 3 sets - 10 reps   Cues and demo for proper technique. S for safety. No pain during session.    SELF CARE:  PATIENT EDUCATION:  Education details: PT educated pt on progressed strengthening and how to modify as needed and how to perform at the gym. Discussed bladder behavior techniques to decr. Urinary frequency. Person educated: Patient Education method: Explanation, Demonstration, and Handouts Education comprehension: verbalized understanding and needs further education  HOME EXERCISE PROGRAM: [email or web address]  ASSESSMENT:  CLINICAL  IMPRESSION: Skilled session focused on progressing strengthening of core, hips, LEs, UEs to improve posture and decr. PFM tension. Pt demonstrated progress as she tolerated progression of HEP without incr. In pain or leakage. Pt The following impairments were noted upon exam: limited ROM, back pain, postural dysfunction, decr. Strength likely, and urgency and stress UI. Pt would continue to benefit from skilled PT to improve safety and decr. Pain during all ADLs. PT requesting add'l visits to meet on-going goals.  OBJECTIVE IMPAIRMENTS: Abnormal gait, decreased coordination, decreased endurance, decreased strength, hypomobility, increased fascial restrictions, postural dysfunction, and pain.   ACTIVITY LIMITATIONS: carrying, lifting, bending, sitting, standing, squatting, transfers, continence, locomotion level, and caring for others  PARTICIPATION LIMITATIONS: meal prep, cleaning, laundry, interpersonal relationship, community activity, and occupation  PERSONAL FACTORS: 3+ comorbidities: see above and pt currently undergoing GI testing are also affecting patient's functional outcome.   REHAB POTENTIAL: Good  CLINICAL DECISION MAKING: Evolving/moderate complexity  EVALUATION COMPLEXITY: Moderate currently undergoing GI testing   GOALS: Goals reviewed with patient? Yes  SHORT TERM GOALS: Target date: for all STGs: 02/16/24  Pt will be IND in HEP to improve pain, strength, coordination. Baseline: no HEP but goes to gym 3x/week but used to go 4-5x/week Goal status: MET  2.  Finish exam and write goals as indicated. Baseline: limited by time constraints Goal status: MET  3.  Pt will demo proper toileting posture to fully empty bladder and reduce straining during bowel movement. Baseline: unable to demo 12/1: able to demo  Goal status: MET   4.  Pt will demonstrated improved relaxation and contraction of PFM with coordination of breath to reduce urinary leakage to  </=three/week. Baseline: wears three pads a day 2/2 UI; 12/1: no leakage this last week, only wearing a pantyliner  Goal status: MET   LONG TERM GOALS: Target date: for all LTGs: 03/15/24. New target date for all LTGs: 04/30/24  Pt will report going back to the gym 4-5x/week to maximize gains made in PT. Baseline: three times a week-decr. 2/2 fatigue and UI limits what she does 1/19: she feels like that is better and she's picking back up, she gets her steps in-, 3-5 days/week depending on schedule. Goal status: PARTIALLY MET  2.  Pt will demonstrated improved relaxation and contraction of PFM with coordination of breath to reduce urinary leakage to </=once/week. Baseline:  three pads a day;  1/19: she wears pantyliners now, about 3 a day and it was improved but then coughing incr. it Goal status: PARTIALLY MET   PLAN: check LTGs, progress HEP and continue to L upper trap and TDN prn, assess B scaps and thoracolumbar junction   PT FREQUENCY: 1x/week  PT DURATION: 8 weeks  PLANNED INTERVENTIONS: 97110-Therapeutic exercises, 97530- Therapeutic activity, [id number]- Neuromuscular re-education, 97535- Self Care, [id number]- Manual therapy, and Patient/Family education, Dry needling   Avrum Kimball L, PT 04/19/2024, 3:20 PM  Delon Pinal, PT,DPT 04/19/24 3:20 PM Phone: [phone] Fax: [phone]  "

## 2024-04-23 ENCOUNTER — Inpatient Hospital Stay

## 2024-04-23 ENCOUNTER — Inpatient Hospital Stay: Admitting: Internal Medicine

## 2024-05-01 ENCOUNTER — Ambulatory Visit

## 2024-05-02 ENCOUNTER — Ambulatory Visit: Admitting: Family Medicine

## 2024-05-15 ENCOUNTER — Ambulatory Visit

## 2024-05-29 ENCOUNTER — Ambulatory Visit

## 2024-06-12 ENCOUNTER — Ambulatory Visit

## 2024-06-26 ENCOUNTER — Ambulatory Visit

## 2024-07-10 ENCOUNTER — Ambulatory Visit
